# Patient Record
Sex: Female | Born: 1960 | Race: White | Hispanic: No | Smoking: Never smoker
Health system: Southern US, Community
[De-identification: ages and names within clinical notes are randomized; demographics above are authoritative.]

## PROBLEM LIST (undated history)

## (undated) DIAGNOSIS — C801 Malignant (primary) neoplasm, unspecified: Secondary | ICD-10-CM

## (undated) DIAGNOSIS — E785 Hyperlipidemia, unspecified: Secondary | ICD-10-CM

## (undated) DIAGNOSIS — Z9221 Personal history of antineoplastic chemotherapy: Secondary | ICD-10-CM

## (undated) DIAGNOSIS — K219 Gastro-esophageal reflux disease without esophagitis: Secondary | ICD-10-CM

## (undated) DIAGNOSIS — G629 Polyneuropathy, unspecified: Secondary | ICD-10-CM

## (undated) DIAGNOSIS — M1990 Unspecified osteoarthritis, unspecified site: Secondary | ICD-10-CM

## (undated) DIAGNOSIS — Z923 Personal history of irradiation: Secondary | ICD-10-CM

## (undated) DIAGNOSIS — M6788 Other specified disorders of synovium and tendon, other site: Secondary | ICD-10-CM

## (undated) DIAGNOSIS — E119 Type 2 diabetes mellitus without complications: Secondary | ICD-10-CM

## (undated) DIAGNOSIS — M2550 Pain in unspecified joint: Secondary | ICD-10-CM

## (undated) DIAGNOSIS — Z1371 Encounter for nonprocreative screening for genetic disease carrier status: Secondary | ICD-10-CM

## (undated) DIAGNOSIS — M7989 Other specified soft tissue disorders: Secondary | ICD-10-CM

## (undated) DIAGNOSIS — K760 Fatty (change of) liver, not elsewhere classified: Secondary | ICD-10-CM

## (undated) HISTORY — DX: Type 2 diabetes mellitus without complications: E11.9

## (undated) HISTORY — DX: Fatty (change of) liver, not elsewhere classified: K76.0

## (undated) HISTORY — DX: Gastro-esophageal reflux disease without esophagitis: K21.9

## (undated) HISTORY — DX: Pain in unspecified joint: M25.50

## (undated) HISTORY — PX: REDUCTION MAMMAPLASTY: SUR839

## (undated) HISTORY — DX: Unspecified osteoarthritis, unspecified site: M19.90

## (undated) HISTORY — DX: Polyneuropathy, unspecified: G62.9

## (undated) HISTORY — PX: OOPHORECTOMY: SHX86

## (undated) HISTORY — DX: Other specified soft tissue disorders: M79.89

## (undated) HISTORY — PX: CYSTOSCOPY WITH BIOPSY: SHX5122

## (undated) HISTORY — DX: Hyperlipidemia, unspecified: E78.5

## (undated) HISTORY — DX: Other specified disorders of synovium and tendon, other site: M67.88

---

## 1998-09-30 ENCOUNTER — Ambulatory Visit (HOSPITAL_COMMUNITY): Admission: RE | Admit: 1998-09-30 | Discharge: 1998-09-30 | Payer: Self-pay | Admitting: Obstetrics and Gynecology

## 1998-10-14 ENCOUNTER — Encounter: Payer: Self-pay | Admitting: Obstetrics and Gynecology

## 1998-10-14 ENCOUNTER — Ambulatory Visit (HOSPITAL_COMMUNITY): Admission: RE | Admit: 1998-10-14 | Discharge: 1998-10-14 | Payer: Self-pay | Admitting: Obstetrics and Gynecology

## 1998-11-24 ENCOUNTER — Ambulatory Visit (HOSPITAL_COMMUNITY): Admission: RE | Admit: 1998-11-24 | Discharge: 1998-11-24 | Payer: Self-pay | Admitting: *Deleted

## 1998-11-24 ENCOUNTER — Encounter: Payer: Self-pay | Admitting: Surgery

## 1998-11-24 ENCOUNTER — Encounter (INDEPENDENT_AMBULATORY_CARE_PROVIDER_SITE_OTHER): Payer: Self-pay | Admitting: Specialist

## 1999-04-03 HISTORY — PX: ABDOMINAL HYSTERECTOMY: SHX81

## 1999-07-31 ENCOUNTER — Encounter (INDEPENDENT_AMBULATORY_CARE_PROVIDER_SITE_OTHER): Payer: Self-pay

## 1999-07-31 ENCOUNTER — Inpatient Hospital Stay (HOSPITAL_COMMUNITY): Admission: RE | Admit: 1999-07-31 | Discharge: 1999-08-01 | Payer: Self-pay | Admitting: Obstetrics and Gynecology

## 2000-07-22 ENCOUNTER — Encounter: Admission: RE | Admit: 2000-07-22 | Discharge: 2000-10-20 | Payer: Self-pay | Admitting: Anesthesiology

## 2001-09-29 ENCOUNTER — Encounter: Payer: Self-pay | Admitting: Surgery

## 2001-09-29 ENCOUNTER — Ambulatory Visit (HOSPITAL_COMMUNITY): Admission: RE | Admit: 2001-09-29 | Discharge: 2001-09-29 | Payer: Self-pay | Admitting: Surgery

## 2002-04-02 HISTORY — PX: BREAST REDUCTION SURGERY: SHX8

## 2002-06-30 ENCOUNTER — Encounter (INDEPENDENT_AMBULATORY_CARE_PROVIDER_SITE_OTHER): Payer: Self-pay | Admitting: *Deleted

## 2002-06-30 ENCOUNTER — Ambulatory Visit (HOSPITAL_BASED_OUTPATIENT_CLINIC_OR_DEPARTMENT_OTHER): Admission: RE | Admit: 2002-06-30 | Discharge: 2002-07-01 | Payer: Self-pay | Admitting: Orthopaedic Surgery

## 2003-03-03 ENCOUNTER — Ambulatory Visit (HOSPITAL_COMMUNITY): Admission: RE | Admit: 2003-03-03 | Discharge: 2003-03-03 | Payer: Self-pay | Admitting: Obstetrics and Gynecology

## 2004-04-24 ENCOUNTER — Encounter: Admission: RE | Admit: 2004-04-24 | Discharge: 2004-04-24 | Payer: Self-pay | Admitting: Obstetrics and Gynecology

## 2005-04-02 DIAGNOSIS — C801 Malignant (primary) neoplasm, unspecified: Secondary | ICD-10-CM

## 2005-04-02 HISTORY — DX: Malignant (primary) neoplasm, unspecified: C80.1

## 2005-04-02 HISTORY — PX: PORT-A-CATH REMOVAL: SHX5289

## 2005-04-02 HISTORY — PX: CARPAL TUNNEL RELEASE: SHX101

## 2005-04-02 HISTORY — PX: BREAST SURGERY: SHX581

## 2005-04-02 HISTORY — PX: BREAST LUMPECTOMY: SHX2

## 2005-06-26 ENCOUNTER — Encounter: Admission: RE | Admit: 2005-06-26 | Discharge: 2005-06-26 | Payer: Self-pay | Admitting: Surgery

## 2005-06-29 ENCOUNTER — Encounter (INDEPENDENT_AMBULATORY_CARE_PROVIDER_SITE_OTHER): Payer: Self-pay | Admitting: Specialist

## 2005-06-29 ENCOUNTER — Encounter: Admission: RE | Admit: 2005-06-29 | Discharge: 2005-06-29 | Payer: Self-pay | Admitting: Surgery

## 2005-06-29 ENCOUNTER — Encounter (INDEPENDENT_AMBULATORY_CARE_PROVIDER_SITE_OTHER): Payer: Self-pay | Admitting: Diagnostic Radiology

## 2005-07-03 ENCOUNTER — Ambulatory Visit (HOSPITAL_COMMUNITY): Admission: RE | Admit: 2005-07-03 | Discharge: 2005-07-03 | Payer: Self-pay | Admitting: General Surgery

## 2005-07-04 ENCOUNTER — Encounter: Admission: RE | Admit: 2005-07-04 | Discharge: 2005-07-04 | Payer: Self-pay | Admitting: General Surgery

## 2005-07-04 ENCOUNTER — Encounter (INDEPENDENT_AMBULATORY_CARE_PROVIDER_SITE_OTHER): Payer: Self-pay | Admitting: *Deleted

## 2005-07-04 ENCOUNTER — Ambulatory Visit (HOSPITAL_BASED_OUTPATIENT_CLINIC_OR_DEPARTMENT_OTHER): Admission: RE | Admit: 2005-07-04 | Discharge: 2005-07-04 | Payer: Self-pay | Admitting: General Surgery

## 2005-07-09 ENCOUNTER — Ambulatory Visit: Payer: Self-pay | Admitting: Oncology

## 2005-07-11 LAB — CBC WITH DIFFERENTIAL/PLATELET
Eosinophils Absolute: 0.1 10*3/uL (ref 0.0–0.5)
MONO#: 0.3 10*3/uL (ref 0.1–0.9)
NEUT#: 3.4 10*3/uL (ref 1.5–6.5)
Platelets: 266 10*3/uL (ref 145–400)
RBC: 4.28 10*6/uL (ref 3.70–5.32)
RDW: 12.9 % (ref 11.3–14.5)
WBC: 5.3 10*3/uL (ref 3.9–10.0)

## 2005-07-11 LAB — COMPREHENSIVE METABOLIC PANEL
Albumin: 4.7 g/dL (ref 3.5–5.2)
CO2: 23 mEq/L (ref 19–32)
Chloride: 103 mEq/L (ref 96–112)
Glucose, Bld: 119 mg/dL — ABNORMAL HIGH (ref 70–99)
Potassium: 4.1 mEq/L (ref 3.5–5.3)
Sodium: 137 mEq/L (ref 135–145)
Total Protein: 7.5 g/dL (ref 6.0–8.3)

## 2005-07-11 LAB — LACTATE DEHYDROGENASE: LDH: 137 U/L (ref 94–250)

## 2005-07-11 LAB — CANCER ANTIGEN 27.29: CA 27.29: 9 U/mL (ref 0–39)

## 2005-07-19 ENCOUNTER — Ambulatory Visit: Payer: Self-pay | Admitting: Cardiology

## 2005-07-19 ENCOUNTER — Ambulatory Visit (HOSPITAL_COMMUNITY): Admission: RE | Admit: 2005-07-19 | Discharge: 2005-07-19 | Payer: Self-pay | Admitting: Oncology

## 2005-07-19 ENCOUNTER — Encounter: Payer: Self-pay | Admitting: Cardiology

## 2005-07-23 ENCOUNTER — Ambulatory Visit (HOSPITAL_COMMUNITY): Admission: RE | Admit: 2005-07-23 | Discharge: 2005-07-23 | Payer: Self-pay | Admitting: Oncology

## 2005-07-26 ENCOUNTER — Ambulatory Visit (HOSPITAL_BASED_OUTPATIENT_CLINIC_OR_DEPARTMENT_OTHER): Admission: RE | Admit: 2005-07-26 | Discharge: 2005-07-26 | Payer: Self-pay | Admitting: General Surgery

## 2005-08-02 LAB — CBC WITH DIFFERENTIAL/PLATELET
EOS%: 2.4 % (ref 0.0–7.0)
MCH: 30.3 pg (ref 26.0–34.0)
MCV: 87.6 fL (ref 81.0–101.0)
MONO%: 5.6 % (ref 0.0–13.0)
NEUT#: 3.3 10*3/uL (ref 1.5–6.5)
RBC: 4.28 10*6/uL (ref 3.70–5.32)
RDW: 13.1 % (ref 11.3–14.5)
lymph#: 1.5 10*3/uL (ref 0.9–3.3)

## 2005-08-13 LAB — CBC WITH DIFFERENTIAL/PLATELET
Eosinophils Absolute: 0 10*3/uL (ref 0.0–0.5)
HGB: 10.8 g/dL — ABNORMAL LOW (ref 11.6–15.9)
MONO#: 0.1 10*3/uL (ref 0.1–0.9)
NEUT#: 4.3 10*3/uL (ref 1.5–6.5)
RBC: 3.64 10*6/uL — ABNORMAL LOW (ref 3.70–5.32)
RDW: 12.6 % (ref 11.3–14.5)
WBC: 6.3 10*3/uL (ref 3.9–10.0)
lymph#: 1.9 10*3/uL (ref 0.9–3.3)

## 2005-08-23 LAB — COMPREHENSIVE METABOLIC PANEL
Albumin: 4.3 g/dL (ref 3.5–5.2)
CO2: 25 mEq/L (ref 19–32)
Glucose, Bld: 81 mg/dL (ref 70–99)
Sodium: 136 mEq/L (ref 135–145)
Total Bilirubin: 0.2 mg/dL — ABNORMAL LOW (ref 0.3–1.2)
Total Protein: 7 g/dL (ref 6.0–8.3)

## 2005-08-23 LAB — CBC WITH DIFFERENTIAL/PLATELET
Basophils Absolute: 0 10*3/uL (ref 0.0–0.1)
Eosinophils Absolute: 0 10*3/uL (ref 0.0–0.5)
HCT: 31 % — ABNORMAL LOW (ref 34.8–46.6)
HGB: 10.8 g/dL — ABNORMAL LOW (ref 11.6–15.9)
LYMPH%: 30.7 % (ref 14.0–48.0)
MONO#: 0.4 10*3/uL (ref 0.1–0.9)
NEUT#: 2.9 10*3/uL (ref 1.5–6.5)
NEUT%: 59.9 % (ref 39.6–76.8)
Platelets: 359 10*3/uL (ref 145–400)
RBC: 3.59 10*6/uL — ABNORMAL LOW (ref 3.70–5.32)
WBC: 4.8 10*3/uL (ref 3.9–10.0)

## 2005-08-24 ENCOUNTER — Ambulatory Visit: Payer: Self-pay | Admitting: Oncology

## 2005-09-06 LAB — CBC WITH DIFFERENTIAL/PLATELET
Basophils Absolute: 0 10*3/uL (ref 0.0–0.1)
EOS%: 0.1 % (ref 0.0–7.0)
Eosinophils Absolute: 0 10*3/uL (ref 0.0–0.5)
HGB: 11.9 g/dL (ref 11.6–15.9)
MCH: 30.3 pg (ref 26.0–34.0)
MONO%: 6.1 % (ref 0.0–13.0)
NEUT#: 5.1 10*3/uL (ref 1.5–6.5)
RBC: 3.95 10*6/uL (ref 3.70–5.32)
RDW: 14 % (ref 11.3–14.5)
lymph#: 1.7 10*3/uL (ref 0.9–3.3)

## 2005-09-13 LAB — CBC WITH DIFFERENTIAL/PLATELET
Basophils Absolute: 0 10*3/uL (ref 0.0–0.1)
Eosinophils Absolute: 0 10*3/uL (ref 0.0–0.5)
HGB: 11 g/dL — ABNORMAL LOW (ref 11.6–15.9)
MCV: 88.2 fL (ref 81.0–101.0)
MONO#: 0.4 10*3/uL (ref 0.1–0.9)
MONO%: 8.8 % (ref 0.0–13.0)
NEUT#: 2.7 10*3/uL (ref 1.5–6.5)
RBC: 3.55 10*6/uL — ABNORMAL LOW (ref 3.70–5.32)
RDW: 15.9 % — ABNORMAL HIGH (ref 11.3–14.5)
WBC: 4.2 10*3/uL (ref 3.9–10.0)

## 2005-09-14 LAB — COMPREHENSIVE METABOLIC PANEL
Albumin: 4.5 g/dL (ref 3.5–5.2)
Alkaline Phosphatase: 63 U/L (ref 39–117)
BUN: 11 mg/dL (ref 6–23)
CO2: 27 mEq/L (ref 19–32)
Calcium: 9.2 mg/dL (ref 8.4–10.5)
Chloride: 104 mEq/L (ref 96–112)
Glucose, Bld: 102 mg/dL — ABNORMAL HIGH (ref 70–99)
Potassium: 4 mEq/L (ref 3.5–5.3)
Sodium: 140 mEq/L (ref 135–145)
Total Protein: 7.2 g/dL (ref 6.0–8.3)

## 2005-09-20 LAB — CBC WITH DIFFERENTIAL/PLATELET
Basophils Absolute: 0 10*3/uL (ref 0.0–0.1)
Eosinophils Absolute: 0 10*3/uL (ref 0.0–0.5)
HGB: 9.9 g/dL — ABNORMAL LOW (ref 11.6–15.9)
MCV: 88 fL (ref 81.0–101.0)
MONO%: 1.1 % (ref 0.0–13.0)
NEUT#: 1.9 10*3/uL (ref 1.5–6.5)
RBC: 3.23 10*6/uL — ABNORMAL LOW (ref 3.70–5.32)
RDW: 15.4 % — ABNORMAL HIGH (ref 11.3–14.5)
WBC: 2.6 10*3/uL — ABNORMAL LOW (ref 3.9–10.0)
lymph#: 0.6 10*3/uL — ABNORMAL LOW (ref 0.9–3.3)

## 2005-09-20 LAB — COMPREHENSIVE METABOLIC PANEL
AST: 12 U/L (ref 0–37)
Albumin: 4 g/dL (ref 3.5–5.2)
Alkaline Phosphatase: 64 U/L (ref 39–117)
BUN: 14 mg/dL (ref 6–23)
Calcium: 8.2 mg/dL — ABNORMAL LOW (ref 8.4–10.5)
Chloride: 106 mEq/L (ref 96–112)
Glucose, Bld: 100 mg/dL — ABNORMAL HIGH (ref 70–99)
Potassium: 4.3 mEq/L (ref 3.5–5.3)
Sodium: 139 mEq/L (ref 135–145)
Total Protein: 6.2 g/dL (ref 6.0–8.3)

## 2005-10-04 LAB — CBC WITH DIFFERENTIAL/PLATELET
Basophils Absolute: 0 10*3/uL (ref 0.0–0.1)
EOS%: 0 % (ref 0.0–7.0)
Eosinophils Absolute: 0 10*3/uL (ref 0.0–0.5)
HGB: 10.7 g/dL — ABNORMAL LOW (ref 11.6–15.9)
NEUT#: 2.4 10*3/uL (ref 1.5–6.5)
RDW: 17.9 % — ABNORMAL HIGH (ref 11.3–14.5)
WBC: 4 10*3/uL (ref 3.9–10.0)
lymph#: 1.1 10*3/uL (ref 0.9–3.3)

## 2005-10-04 LAB — COMPREHENSIVE METABOLIC PANEL
AST: 21 U/L (ref 0–37)
Albumin: 4.5 g/dL (ref 3.5–5.2)
BUN: 16 mg/dL (ref 6–23)
Calcium: 9.3 mg/dL (ref 8.4–10.5)
Chloride: 101 mEq/L (ref 96–112)
Glucose, Bld: 96 mg/dL (ref 70–99)
Potassium: 4.2 mEq/L (ref 3.5–5.3)

## 2005-10-05 ENCOUNTER — Ambulatory Visit: Payer: Self-pay | Admitting: Oncology

## 2005-10-12 LAB — CBC WITH DIFFERENTIAL/PLATELET
BASO%: 0.2 % (ref 0.0–2.0)
Basophils Absolute: 0.1 10*3/uL (ref 0.0–0.1)
EOS%: 0.1 % (ref 0.0–7.0)
HGB: 11.1 g/dL — ABNORMAL LOW (ref 11.6–15.9)
MCH: 31.2 pg (ref 26.0–34.0)
RBC: 3.55 10*6/uL — ABNORMAL LOW (ref 3.70–5.32)
RDW: 18.3 % — ABNORMAL HIGH (ref 11.3–14.5)
lymph#: 2.2 10*3/uL (ref 0.9–3.3)

## 2005-10-24 LAB — CBC WITH DIFFERENTIAL/PLATELET
Basophils Absolute: 0.1 10*3/uL (ref 0.0–0.1)
Eosinophils Absolute: 0 10*3/uL (ref 0.0–0.5)
HGB: 10.9 g/dL — ABNORMAL LOW (ref 11.6–15.9)
MONO#: 0.5 10*3/uL (ref 0.1–0.9)
NEUT#: 2.8 10*3/uL (ref 1.5–6.5)
RDW: 18.5 % — ABNORMAL HIGH (ref 11.3–14.5)
WBC: 4.4 10*3/uL (ref 3.9–10.0)
lymph#: 1 10*3/uL (ref 0.9–3.3)

## 2005-10-24 LAB — COMPREHENSIVE METABOLIC PANEL
Albumin: 4.6 g/dL (ref 3.5–5.2)
BUN: 14 mg/dL (ref 6–23)
Calcium: 9.7 mg/dL (ref 8.4–10.5)
Chloride: 102 mEq/L (ref 96–112)
Glucose, Bld: 81 mg/dL (ref 70–99)
Potassium: 4.3 mEq/L (ref 3.5–5.3)
Sodium: 138 mEq/L (ref 135–145)
Total Protein: 7.2 g/dL (ref 6.0–8.3)

## 2005-11-15 LAB — COMPREHENSIVE METABOLIC PANEL
Albumin: 4.4 g/dL (ref 3.5–5.2)
Alkaline Phosphatase: 73 U/L (ref 39–117)
BUN: 15 mg/dL (ref 6–23)
CO2: 23 mEq/L (ref 19–32)
Calcium: 9.6 mg/dL (ref 8.4–10.5)
Glucose, Bld: 90 mg/dL (ref 70–99)
Potassium: 4.2 mEq/L (ref 3.5–5.3)
Total Protein: 6.8 g/dL (ref 6.0–8.3)

## 2005-11-15 LAB — CBC WITH DIFFERENTIAL/PLATELET
BASO%: 0.9 % (ref 0.0–2.0)
LYMPH%: 18.5 % (ref 14.0–48.0)
MCHC: 34.3 g/dL (ref 32.0–36.0)
MONO#: 0.5 10*3/uL (ref 0.1–0.9)
Platelets: 324 10*3/uL (ref 145–400)
RBC: 3.51 10*6/uL — ABNORMAL LOW (ref 3.70–5.32)
RDW: 16.7 % — ABNORMAL HIGH (ref 11.3–14.5)
WBC: 5.3 10*3/uL (ref 3.9–10.0)
lymph#: 1 10*3/uL (ref 0.9–3.3)

## 2005-11-16 ENCOUNTER — Ambulatory Visit: Payer: Self-pay | Admitting: Oncology

## 2005-11-21 ENCOUNTER — Ambulatory Visit: Admission: RE | Admit: 2005-11-21 | Discharge: 2006-02-02 | Payer: Self-pay | Admitting: Radiation Oncology

## 2005-11-23 LAB — CBC WITH DIFFERENTIAL/PLATELET
Basophils Absolute: 0 10*3/uL (ref 0.0–0.1)
Eosinophils Absolute: 0 10*3/uL (ref 0.0–0.5)
HGB: 9.7 g/dL — ABNORMAL LOW (ref 11.6–15.9)
MCV: 87.4 fL (ref 81.0–101.0)
MONO#: 0.1 10*3/uL (ref 0.1–0.9)
NEUT#: 0.2 10*3/uL — CL (ref 1.5–6.5)
RBC: 3.18 10*6/uL — ABNORMAL LOW (ref 3.70–5.32)
RDW: 16.3 % — ABNORMAL HIGH (ref 11.3–14.5)
WBC: 0.8 10*3/uL — CL (ref 3.9–10.0)
lymph#: 0.5 10*3/uL — ABNORMAL LOW (ref 0.9–3.3)

## 2005-11-23 LAB — COMPREHENSIVE METABOLIC PANEL
ALT: 27 U/L (ref 0–40)
AST: 19 U/L (ref 0–37)
Alkaline Phosphatase: 59 U/L (ref 39–117)
CO2: 25 mEq/L (ref 19–32)
Chloride: 102 mEq/L (ref 96–112)
Creatinine, Ser: 0.98 mg/dL (ref 0.40–1.20)
Potassium: 4 mEq/L (ref 3.5–5.3)
Sodium: 139 mEq/L (ref 135–145)
Total Bilirubin: 0.4 mg/dL (ref 0.3–1.2)

## 2005-11-27 LAB — CBC WITH DIFFERENTIAL/PLATELET
BASO%: 1 % (ref 0.0–2.0)
HCT: 28.4 % — ABNORMAL LOW (ref 34.8–46.6)
LYMPH%: 34.9 % (ref 14.0–48.0)
MCHC: 34.2 g/dL (ref 32.0–36.0)
MONO#: 0.4 10*3/uL (ref 0.1–0.9)
NEUT%: 34 % — ABNORMAL LOW (ref 39.6–76.8)
Platelets: 249 10*3/uL (ref 145–400)
WBC: 1.4 10*3/uL — ABNORMAL LOW (ref 3.9–10.0)

## 2005-11-29 LAB — CBC WITH DIFFERENTIAL/PLATELET
BASO%: 1.3 % (ref 0.0–2.0)
EOS%: 1.7 % (ref 0.0–7.0)
HCT: 31.9 % — ABNORMAL LOW (ref 34.8–46.6)
LYMPH%: 24.7 % (ref 14.0–48.0)
MCH: 29.6 pg (ref 26.0–34.0)
MCHC: 33.6 g/dL (ref 32.0–36.0)
MONO%: 21.6 % — ABNORMAL HIGH (ref 0.0–13.0)
NEUT%: 50.6 % (ref 39.6–76.8)
Platelets: 229 10*3/uL (ref 145–400)
lymph#: 0.6 10*3/uL — ABNORMAL LOW (ref 0.9–3.3)

## 2005-12-04 ENCOUNTER — Ambulatory Visit (HOSPITAL_COMMUNITY): Admission: RE | Admit: 2005-12-04 | Discharge: 2005-12-04 | Payer: Self-pay | Admitting: Oncology

## 2005-12-04 LAB — CBC WITH DIFFERENTIAL/PLATELET
BASO%: 0.2 % (ref 0.0–2.0)
EOS%: 0.3 % (ref 0.0–7.0)
MCH: 29.8 pg (ref 26.0–34.0)
MCHC: 34.2 g/dL (ref 32.0–36.0)
RBC: 3.54 10*6/uL — ABNORMAL LOW (ref 3.70–5.32)
RDW: 16 % — ABNORMAL HIGH (ref 11.3–14.5)
lymph#: 1 10*3/uL (ref 0.9–3.3)

## 2005-12-11 LAB — COMPREHENSIVE METABOLIC PANEL
Albumin: 4.2 g/dL (ref 3.5–5.2)
BUN: 13 mg/dL (ref 6–23)
Calcium: 9.3 mg/dL (ref 8.4–10.5)
Chloride: 101 mEq/L (ref 96–112)
Glucose, Bld: 78 mg/dL (ref 70–99)
Potassium: 4.3 mEq/L (ref 3.5–5.3)

## 2005-12-11 LAB — CBC WITH DIFFERENTIAL/PLATELET
Basophils Absolute: 0 10*3/uL (ref 0.0–0.1)
Eosinophils Absolute: 0.1 10*3/uL (ref 0.0–0.5)
HCT: 30.5 % — ABNORMAL LOW (ref 34.8–46.6)
HGB: 10.4 g/dL — ABNORMAL LOW (ref 11.6–15.9)
MCV: 87.2 fL (ref 81.0–101.0)
NEUT#: 2.4 10*3/uL (ref 1.5–6.5)
RDW: 17.5 % — ABNORMAL HIGH (ref 11.3–14.5)
lymph#: 1.1 10*3/uL (ref 0.9–3.3)

## 2005-12-13 ENCOUNTER — Encounter (INDEPENDENT_AMBULATORY_CARE_PROVIDER_SITE_OTHER): Payer: Self-pay | Admitting: Cardiology

## 2005-12-13 ENCOUNTER — Ambulatory Visit (HOSPITAL_COMMUNITY): Admission: RE | Admit: 2005-12-13 | Discharge: 2005-12-13 | Payer: Self-pay | Admitting: Oncology

## 2006-02-06 ENCOUNTER — Ambulatory Visit: Payer: Self-pay | Admitting: Oncology

## 2006-02-07 LAB — CBC WITH DIFFERENTIAL/PLATELET
EOS%: 1.6 % (ref 0.0–7.0)
LYMPH%: 32.8 % (ref 14.0–48.0)
MCH: 28.8 pg (ref 26.0–34.0)
MCHC: 34.5 g/dL (ref 32.0–36.0)
MCV: 83.5 fL (ref 81.0–101.0)
MONO%: 8.2 % (ref 0.0–13.0)
Platelets: 242 10*3/uL (ref 145–400)
RBC: 4.02 10*6/uL (ref 3.70–5.32)
RDW: 15.7 % — ABNORMAL HIGH (ref 11.3–14.5)

## 2006-02-12 LAB — TSH: TSH: 1.101 u[IU]/mL (ref 0.350–5.500)

## 2006-02-14 ENCOUNTER — Encounter: Admission: RE | Admit: 2006-02-14 | Discharge: 2006-02-14 | Payer: Self-pay | Admitting: Oncology

## 2006-02-23 LAB — COMPREHENSIVE METABOLIC PANEL
AST: 29 U/L (ref 0–37)
Albumin: 4.2 g/dL (ref 3.5–5.2)
Alkaline Phosphatase: 85 U/L (ref 39–117)
Glucose, Bld: 104 mg/dL — ABNORMAL HIGH (ref 70–99)
Potassium: 3.9 mEq/L (ref 3.5–5.3)
Sodium: 140 mEq/L (ref 135–145)
Total Bilirubin: 0.3 mg/dL (ref 0.3–1.2)
Total Protein: 6.7 g/dL (ref 6.0–8.3)

## 2006-02-23 LAB — ESTRADIOL, ULTRA SENS: Estradiol, Ultra Sensitive: <2 pg/mL

## 2006-02-23 LAB — FOLLICLE STIMULATING HORMONE: FSH: 59.7 m[IU]/mL

## 2006-02-23 LAB — LUTEINIZING HORMONE: LH: 32.9 m[IU]/mL

## 2006-03-21 ENCOUNTER — Ambulatory Visit (HOSPITAL_COMMUNITY): Admission: RE | Admit: 2006-03-21 | Discharge: 2006-03-21 | Payer: Self-pay | Admitting: Oncology

## 2006-03-24 ENCOUNTER — Ambulatory Visit: Payer: Self-pay | Admitting: Oncology

## 2006-03-28 ENCOUNTER — Ambulatory Visit (HOSPITAL_BASED_OUTPATIENT_CLINIC_OR_DEPARTMENT_OTHER): Admission: RE | Admit: 2006-03-28 | Discharge: 2006-03-28 | Payer: Self-pay | Admitting: Orthopedic Surgery

## 2006-03-29 LAB — CBC WITH DIFFERENTIAL/PLATELET
BASO%: 0.4 % (ref 0.0–2.0)
HCT: 34.5 % — ABNORMAL LOW (ref 34.8–46.6)
HGB: 11.8 g/dL (ref 11.6–15.9)
MCHC: 34.3 g/dL (ref 32.0–36.0)
MONO#: 0.2 10*3/uL (ref 0.1–0.9)
NEUT%: 64.1 % (ref 39.6–76.8)
RDW: 15.2 % — ABNORMAL HIGH (ref 11.3–14.5)
WBC: 4 10*3/uL (ref 3.9–10.0)
lymph#: 1.2 10*3/uL (ref 0.9–3.3)

## 2006-03-29 LAB — LACTATE DEHYDROGENASE: LDH: 152 U/L (ref 94–250)

## 2006-03-29 LAB — COMPREHENSIVE METABOLIC PANEL
ALT: 16 U/L (ref 0–35)
AST: 18 U/L (ref 0–37)
Albumin: 4.2 g/dL (ref 3.5–5.2)
CO2: 26 mEq/L (ref 19–32)
Calcium: 8.7 mg/dL (ref 8.4–10.5)
Chloride: 103 mEq/L (ref 96–112)
Creatinine, Ser: 1.18 mg/dL (ref 0.40–1.20)
Potassium: 4.5 mEq/L (ref 3.5–5.3)
Sodium: 140 mEq/L (ref 135–145)
Total Protein: 6.7 g/dL (ref 6.0–8.3)

## 2006-05-08 ENCOUNTER — Ambulatory Visit: Payer: Self-pay | Admitting: Oncology

## 2006-07-12 ENCOUNTER — Encounter: Admission: RE | Admit: 2006-07-12 | Discharge: 2006-07-12 | Payer: Self-pay | Admitting: Oncology

## 2006-08-01 ENCOUNTER — Ambulatory Visit: Payer: Self-pay | Admitting: Oncology

## 2006-08-05 LAB — CBC WITH DIFFERENTIAL/PLATELET
BASO%: 0.4 % (ref 0.0–2.0)
EOS%: 1.6 % (ref 0.0–7.0)
MCH: 30.9 pg (ref 26.0–34.0)
MCHC: 35.2 g/dL (ref 32.0–36.0)
MONO%: 5.6 % (ref 0.0–13.0)
NEUT%: 60.7 % (ref 39.6–76.8)
RDW: 13.3 % (ref 11.3–14.5)
lymph#: 1.5 10*3/uL (ref 0.9–3.3)

## 2006-08-05 LAB — COMPREHENSIVE METABOLIC PANEL
ALT: 16 U/L (ref 0–35)
AST: 19 U/L (ref 0–37)
Alkaline Phosphatase: 63 U/L (ref 39–117)
Calcium: 9 mg/dL (ref 8.4–10.5)
Chloride: 103 mEq/L (ref 96–112)
Creatinine, Ser: 0.99 mg/dL (ref 0.40–1.20)
Potassium: 4.2 mEq/L (ref 3.5–5.3)

## 2006-12-09 ENCOUNTER — Ambulatory Visit: Payer: Self-pay | Admitting: Oncology

## 2006-12-11 LAB — CBC WITH DIFFERENTIAL/PLATELET
Basophils Absolute: 0 10*3/uL (ref 0.0–0.1)
Eosinophils Absolute: 0.1 10*3/uL (ref 0.0–0.5)
HCT: 33 % — ABNORMAL LOW (ref 34.8–46.6)
HGB: 11.8 g/dL (ref 11.6–15.9)
LYMPH%: 41.1 % (ref 14.0–48.0)
MCV: 87.4 fL (ref 81.0–101.0)
MONO%: 7.9 % (ref 0.0–13.0)
NEUT#: 2.1 10*3/uL (ref 1.5–6.5)
NEUT%: 49.3 % (ref 39.6–76.8)
Platelets: 197 10*3/uL (ref 145–400)
RBC: 3.78 10*6/uL (ref 3.70–5.32)

## 2006-12-11 LAB — COMPREHENSIVE METABOLIC PANEL
ALT: 20 U/L (ref 0–35)
Alkaline Phosphatase: 46 U/L (ref 39–117)
CO2: 23 mEq/L (ref 19–32)
Sodium: 141 mEq/L (ref 135–145)
Total Bilirubin: 0.2 mg/dL — ABNORMAL LOW (ref 0.3–1.2)
Total Protein: 6.9 g/dL (ref 6.0–8.3)

## 2006-12-11 LAB — LACTATE DEHYDROGENASE: LDH: 159 U/L (ref 94–250)

## 2007-03-01 ENCOUNTER — Emergency Department (HOSPITAL_COMMUNITY): Admission: EM | Admit: 2007-03-01 | Discharge: 2007-03-01 | Payer: Self-pay | Admitting: Emergency Medicine

## 2007-04-03 HISTORY — PX: BLADDER SUSPENSION: SHX72

## 2007-04-07 ENCOUNTER — Ambulatory Visit: Payer: Self-pay | Admitting: Oncology

## 2007-04-09 LAB — CBC WITH DIFFERENTIAL/PLATELET
Basophils Absolute: 0 10*3/uL (ref 0.0–0.1)
EOS%: 1.3 % (ref 0.0–7.0)
HCT: 34.5 % — ABNORMAL LOW (ref 34.8–46.6)
HGB: 11.9 g/dL (ref 11.6–15.9)
LYMPH%: 44.1 % (ref 14.0–48.0)
MCH: 30.4 pg (ref 26.0–34.0)
MCV: 88.2 fL (ref 81.0–101.0)
MONO%: 6.9 % (ref 0.0–13.0)
NEUT%: 47.1 % (ref 39.6–76.8)

## 2007-04-10 LAB — COMPREHENSIVE METABOLIC PANEL
AST: 18 U/L (ref 0–37)
Alkaline Phosphatase: 44 U/L (ref 39–117)
BUN: 20 mg/dL (ref 6–23)
Creatinine, Ser: 1.17 mg/dL (ref 0.40–1.20)

## 2007-04-10 LAB — FSH/LH: LH: 11.2 m[IU]/mL

## 2007-04-15 LAB — VITAMIN D PNL(25-HYDRXY+1,25-DIHY)-BLD: Vit D, 25-Hydroxy: 24 ng/mL — ABNORMAL LOW (ref 30–89)

## 2007-04-17 LAB — ESTRADIOL, ULTRA SENS: Estradiol, Ultra Sensitive: 48 pg/mL

## 2007-05-16 LAB — FSH/LH: FSH: 16.5 m[IU]/mL

## 2007-05-21 ENCOUNTER — Ambulatory Visit: Payer: Self-pay | Admitting: Oncology

## 2007-05-23 ENCOUNTER — Ambulatory Visit (HOSPITAL_COMMUNITY): Admission: RE | Admit: 2007-05-23 | Discharge: 2007-05-23 | Payer: Self-pay | Admitting: Oncology

## 2007-05-28 ENCOUNTER — Encounter: Admission: RE | Admit: 2007-05-28 | Discharge: 2007-05-28 | Payer: Self-pay | Admitting: Oncology

## 2007-05-28 ENCOUNTER — Ambulatory Visit (HOSPITAL_COMMUNITY): Admission: RE | Admit: 2007-05-28 | Discharge: 2007-05-28 | Payer: Self-pay | Admitting: Oncology

## 2007-07-03 ENCOUNTER — Ambulatory Visit (HOSPITAL_COMMUNITY): Admission: RE | Admit: 2007-07-03 | Discharge: 2007-07-03 | Payer: Self-pay | Admitting: Urology

## 2007-07-03 ENCOUNTER — Encounter (INDEPENDENT_AMBULATORY_CARE_PROVIDER_SITE_OTHER): Payer: Self-pay | Admitting: Urology

## 2007-07-23 ENCOUNTER — Ambulatory Visit: Payer: Self-pay | Admitting: Oncology

## 2007-07-24 ENCOUNTER — Encounter: Admission: RE | Admit: 2007-07-24 | Discharge: 2007-07-24 | Payer: Self-pay | Admitting: Obstetrics and Gynecology

## 2007-07-28 LAB — COMPREHENSIVE METABOLIC PANEL
Albumin: 4.1 g/dL (ref 3.5–5.2)
CO2: 24 mEq/L (ref 19–32)
Calcium: 9.1 mg/dL (ref 8.4–10.5)
Glucose, Bld: 82 mg/dL (ref 70–99)
Potassium: 4.1 mEq/L (ref 3.5–5.3)
Sodium: 138 mEq/L (ref 135–145)
Total Protein: 6.6 g/dL (ref 6.0–8.3)

## 2007-07-28 LAB — CBC WITH DIFFERENTIAL/PLATELET
Eosinophils Absolute: 0.1 10*3/uL (ref 0.0–0.5)
HGB: 10.7 g/dL — ABNORMAL LOW (ref 11.6–15.9)
LYMPH%: 39.8 % (ref 14.0–48.0)
MONO#: 0.3 10*3/uL (ref 0.1–0.9)
NEUT#: 1.9 10*3/uL (ref 1.5–6.5)
Platelets: 189 10*3/uL (ref 145–400)
RBC: 3.49 10*6/uL — ABNORMAL LOW (ref 3.70–5.32)
RDW: 13.4 % (ref 11.3–14.5)
WBC: 3.8 10*3/uL — ABNORMAL LOW (ref 3.9–10.0)

## 2007-07-28 LAB — LACTATE DEHYDROGENASE: LDH: 139 U/L (ref 94–250)

## 2007-08-13 ENCOUNTER — Ambulatory Visit (HOSPITAL_COMMUNITY): Admission: RE | Admit: 2007-08-13 | Discharge: 2007-08-13 | Payer: Self-pay | Admitting: Oncology

## 2007-09-11 ENCOUNTER — Encounter: Admission: RE | Admit: 2007-09-11 | Discharge: 2007-10-09 | Payer: Self-pay | Admitting: Orthopedic Surgery

## 2007-11-11 ENCOUNTER — Ambulatory Visit: Payer: Self-pay | Admitting: Oncology

## 2008-05-19 ENCOUNTER — Ambulatory Visit: Payer: Self-pay | Admitting: Oncology

## 2008-05-21 LAB — CBC WITH DIFFERENTIAL/PLATELET
BASO%: 0.5 % (ref 0.0–2.0)
Basophils Absolute: 0 10*3/uL (ref 0.0–0.1)
EOS%: 2.3 % (ref 0.0–7.0)
HCT: 36.1 % (ref 34.8–46.6)
HGB: 12.5 g/dL (ref 11.6–15.9)
MCH: 31.5 pg (ref 25.1–34.0)
MCHC: 34.6 g/dL (ref 31.5–36.0)
MCV: 91 fL (ref 79.5–101.0)
MONO%: 5.1 % (ref 0.0–14.0)
NEUT%: 64.6 % (ref 38.4–76.8)

## 2008-05-24 LAB — COMPREHENSIVE METABOLIC PANEL
AST: 18 U/L (ref 0–37)
Alkaline Phosphatase: 81 U/L (ref 39–117)
BUN: 16 mg/dL (ref 6–23)
Creatinine, Ser: 1.13 mg/dL (ref 0.40–1.20)
Glucose, Bld: 90 mg/dL (ref 70–99)
Total Bilirubin: 0.4 mg/dL (ref 0.3–1.2)

## 2008-05-24 LAB — LUTEINIZING HORMONE: LH: 49.6 m[IU]/mL

## 2008-05-24 LAB — FOLLICLE STIMULATING HORMONE: FSH: 79.3 m[IU]/mL

## 2008-05-24 LAB — VITAMIN D 25 HYDROXY (VIT D DEFICIENCY, FRACTURES): Vit D, 25-Hydroxy: 27 ng/mL — ABNORMAL LOW (ref 30–89)

## 2008-05-31 LAB — ESTRADIOL, ULTRA SENS

## 2008-07-06 ENCOUNTER — Ambulatory Visit (HOSPITAL_BASED_OUTPATIENT_CLINIC_OR_DEPARTMENT_OTHER): Admission: RE | Admit: 2008-07-06 | Discharge: 2008-07-06 | Payer: Self-pay | Admitting: Urology

## 2008-07-06 ENCOUNTER — Encounter (INDEPENDENT_AMBULATORY_CARE_PROVIDER_SITE_OTHER): Payer: Self-pay | Admitting: Urology

## 2008-07-28 ENCOUNTER — Encounter: Admission: RE | Admit: 2008-07-28 | Discharge: 2008-07-28 | Payer: Self-pay | Admitting: Oncology

## 2008-11-19 ENCOUNTER — Ambulatory Visit: Payer: Self-pay | Admitting: Oncology

## 2008-11-22 LAB — CBC WITH DIFFERENTIAL/PLATELET
Basophils Absolute: 0 10*3/uL (ref 0.0–0.1)
Eosinophils Absolute: 0.1 10*3/uL (ref 0.0–0.5)
HGB: 11.8 g/dL (ref 11.6–15.9)
MCV: 88.8 fL (ref 79.5–101.0)
MONO#: 0.3 10*3/uL (ref 0.1–0.9)
MONO%: 7.2 % (ref 0.0–14.0)
NEUT#: 2.5 10*3/uL (ref 1.5–6.5)
RBC: 3.85 10*6/uL (ref 3.70–5.45)
RDW: 14.5 % (ref 11.2–14.5)
WBC: 4.6 10*3/uL (ref 3.9–10.3)
lymph#: 1.6 10*3/uL (ref 0.9–3.3)

## 2008-11-23 LAB — COMPREHENSIVE METABOLIC PANEL
AST: 22 U/L (ref 0–37)
Albumin: 4.5 g/dL (ref 3.5–5.2)
Alkaline Phosphatase: 88 U/L (ref 39–117)
BUN: 14 mg/dL (ref 6–23)
Calcium: 9.8 mg/dL (ref 8.4–10.5)
Chloride: 103 mEq/L (ref 96–112)
Glucose, Bld: 86 mg/dL (ref 70–99)
Potassium: 3.9 mEq/L (ref 3.5–5.3)
Sodium: 138 mEq/L (ref 135–145)
Total Protein: 6.9 g/dL (ref 6.0–8.3)

## 2008-11-23 LAB — CANCER ANTIGEN 27.29: CA 27.29: 16 U/mL (ref 0–39)

## 2009-01-11 ENCOUNTER — Ambulatory Visit: Payer: Self-pay | Admitting: Oncology

## 2009-06-10 ENCOUNTER — Ambulatory Visit: Payer: Self-pay | Admitting: Sports Medicine

## 2009-06-10 DIAGNOSIS — M2140 Flat foot [pes planus] (acquired), unspecified foot: Secondary | ICD-10-CM | POA: Insufficient documentation

## 2009-06-10 DIAGNOSIS — M79609 Pain in unspecified limb: Secondary | ICD-10-CM

## 2009-06-15 ENCOUNTER — Emergency Department (HOSPITAL_COMMUNITY): Admission: EM | Admit: 2009-06-15 | Discharge: 2009-06-15 | Payer: Self-pay | Admitting: Family Medicine

## 2009-06-15 ENCOUNTER — Emergency Department (HOSPITAL_COMMUNITY): Admission: EM | Admit: 2009-06-15 | Discharge: 2009-06-15 | Payer: Self-pay | Admitting: Emergency Medicine

## 2009-07-05 ENCOUNTER — Ambulatory Visit: Payer: Self-pay | Admitting: Oncology

## 2009-07-07 LAB — CBC WITH DIFFERENTIAL/PLATELET
Basophils Absolute: 0 10*3/uL (ref 0.0–0.1)
Eosinophils Absolute: 0.1 10*3/uL (ref 0.0–0.5)
HGB: 11.4 g/dL — ABNORMAL LOW (ref 11.6–15.9)
LYMPH%: 19.6 % (ref 14.0–49.7)
MCV: 90.4 fL (ref 79.5–101.0)
MONO%: 4.6 % (ref 0.0–14.0)
NEUT#: 6.4 10*3/uL (ref 1.5–6.5)
Platelets: 218 10*3/uL (ref 145–400)
RDW: 14 % (ref 11.2–14.5)

## 2009-07-07 LAB — COMPREHENSIVE METABOLIC PANEL
Albumin: 4.2 g/dL (ref 3.5–5.2)
Alkaline Phosphatase: 77 U/L (ref 39–117)
BUN: 13 mg/dL (ref 6–23)
CO2: 26 mEq/L (ref 19–32)
Glucose, Bld: 94 mg/dL (ref 70–99)
Potassium: 4 mEq/L (ref 3.5–5.3)

## 2009-07-07 LAB — CANCER ANTIGEN 27.29: CA 27.29: 12 U/mL (ref 0–39)

## 2009-07-07 LAB — LACTATE DEHYDROGENASE: LDH: 173 U/L (ref 94–250)

## 2009-07-07 LAB — VITAMIN D 25 HYDROXY (VIT D DEFICIENCY, FRACTURES): Vit D, 25-Hydroxy: 29 ng/mL — ABNORMAL LOW (ref 30–89)

## 2009-08-03 ENCOUNTER — Encounter: Admission: RE | Admit: 2009-08-03 | Discharge: 2009-08-03 | Payer: Self-pay | Admitting: Oncology

## 2010-01-23 ENCOUNTER — Ambulatory Visit: Payer: Self-pay | Admitting: Oncology

## 2010-02-11 IMAGING — CR DG LUMBAR SPINE COMPLETE 4+V
5 series · 5 of 5 positions shown · non-contrast
Comparison: none

CLINICAL DATA: 46-year-old female with history of breast cancer and back pain. 
 LUMBAR SPINE - 5 VIEW:

[t l-spine a.p.]
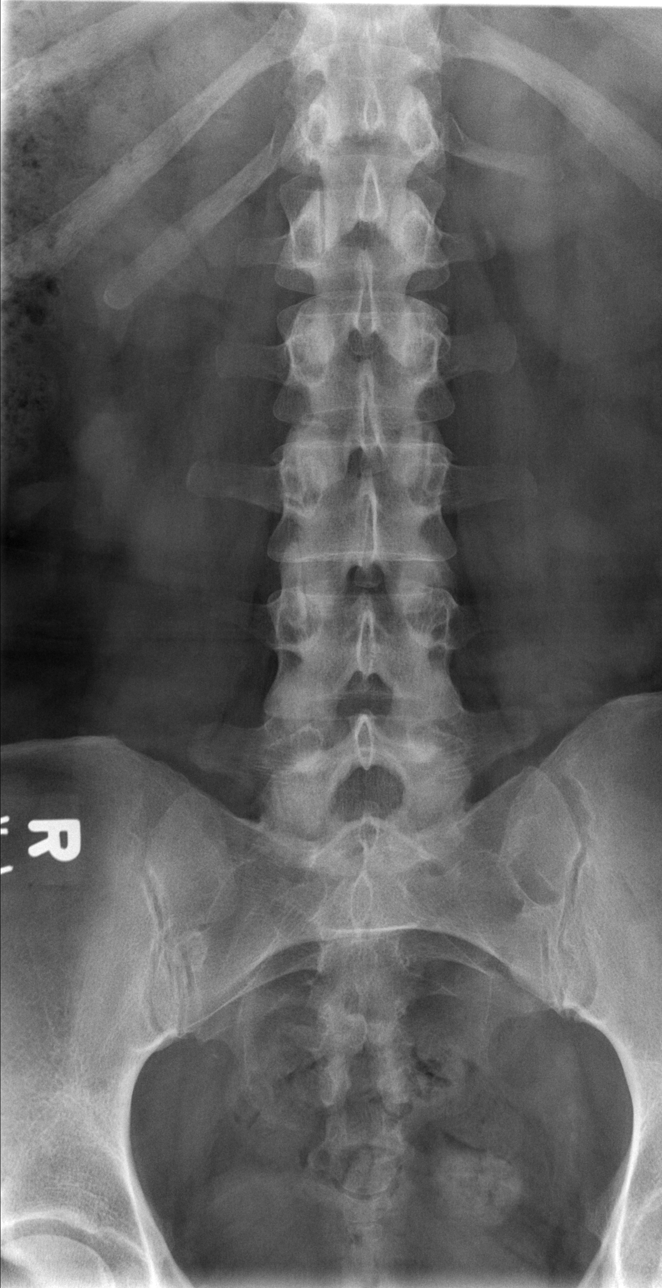

[t l-spine oblique exposure (1 of 2)]
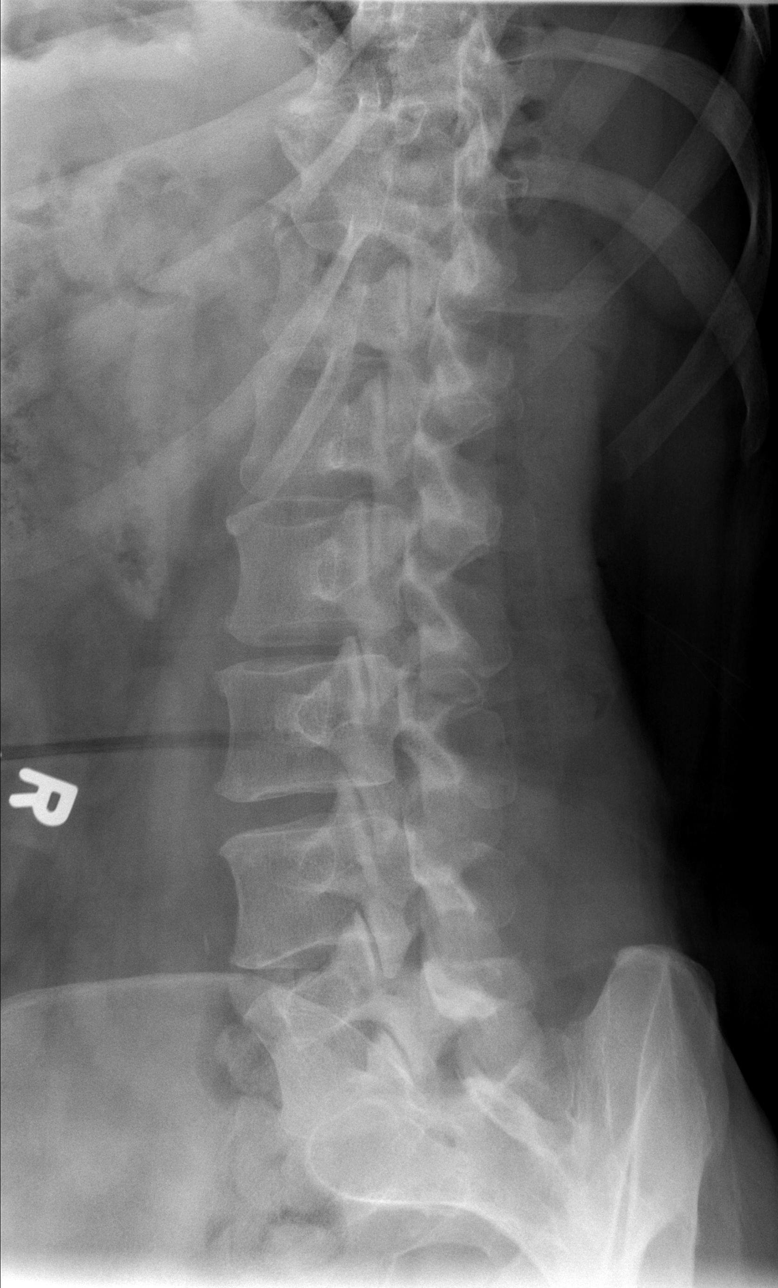

[t l-spine oblique exposure (2 of 2)]
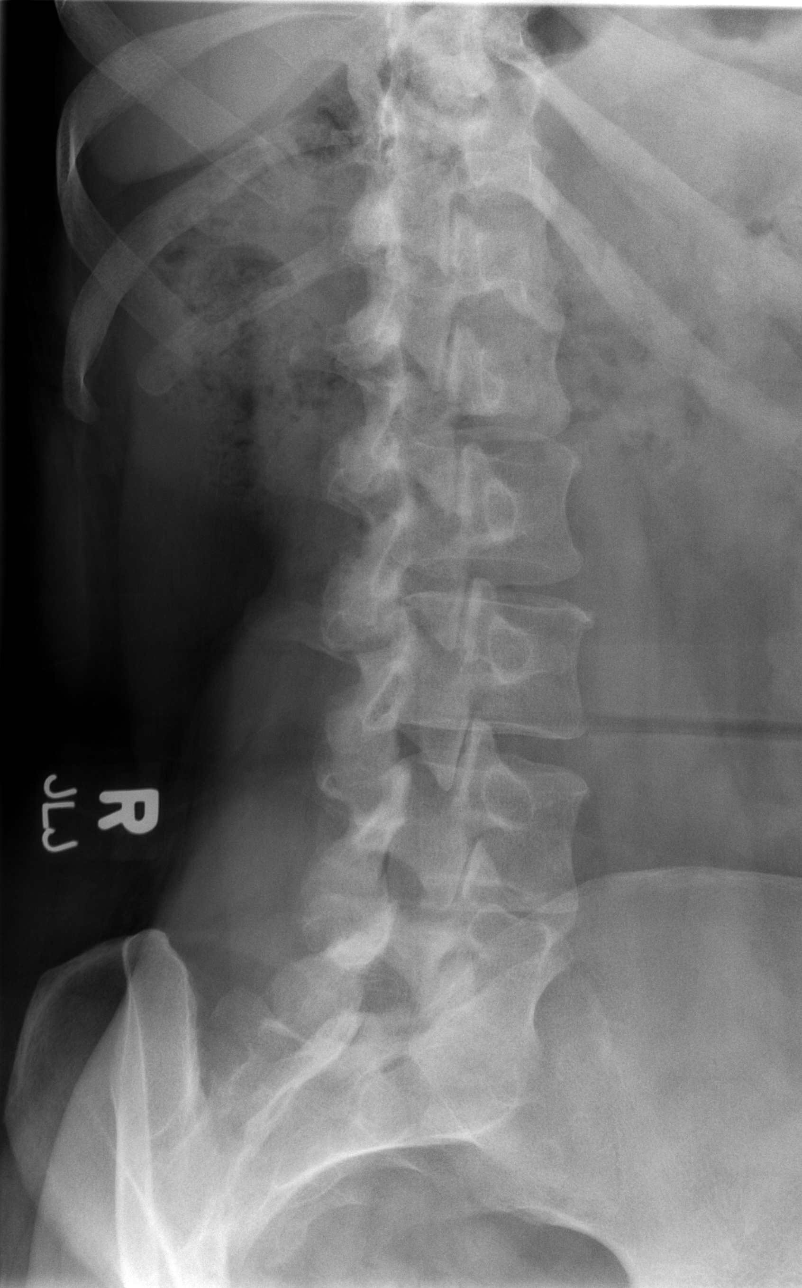

[t l-spine lat]
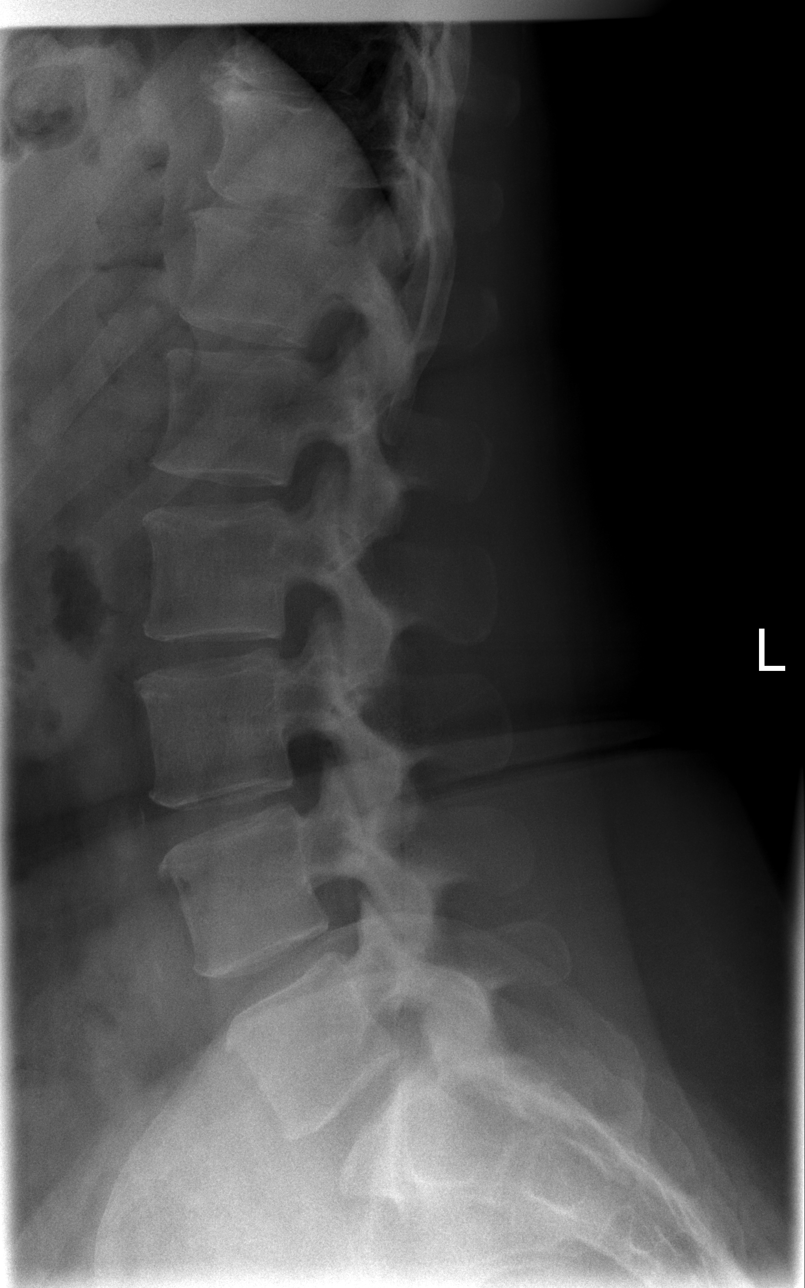

[t l-spine l5-s1 spot]
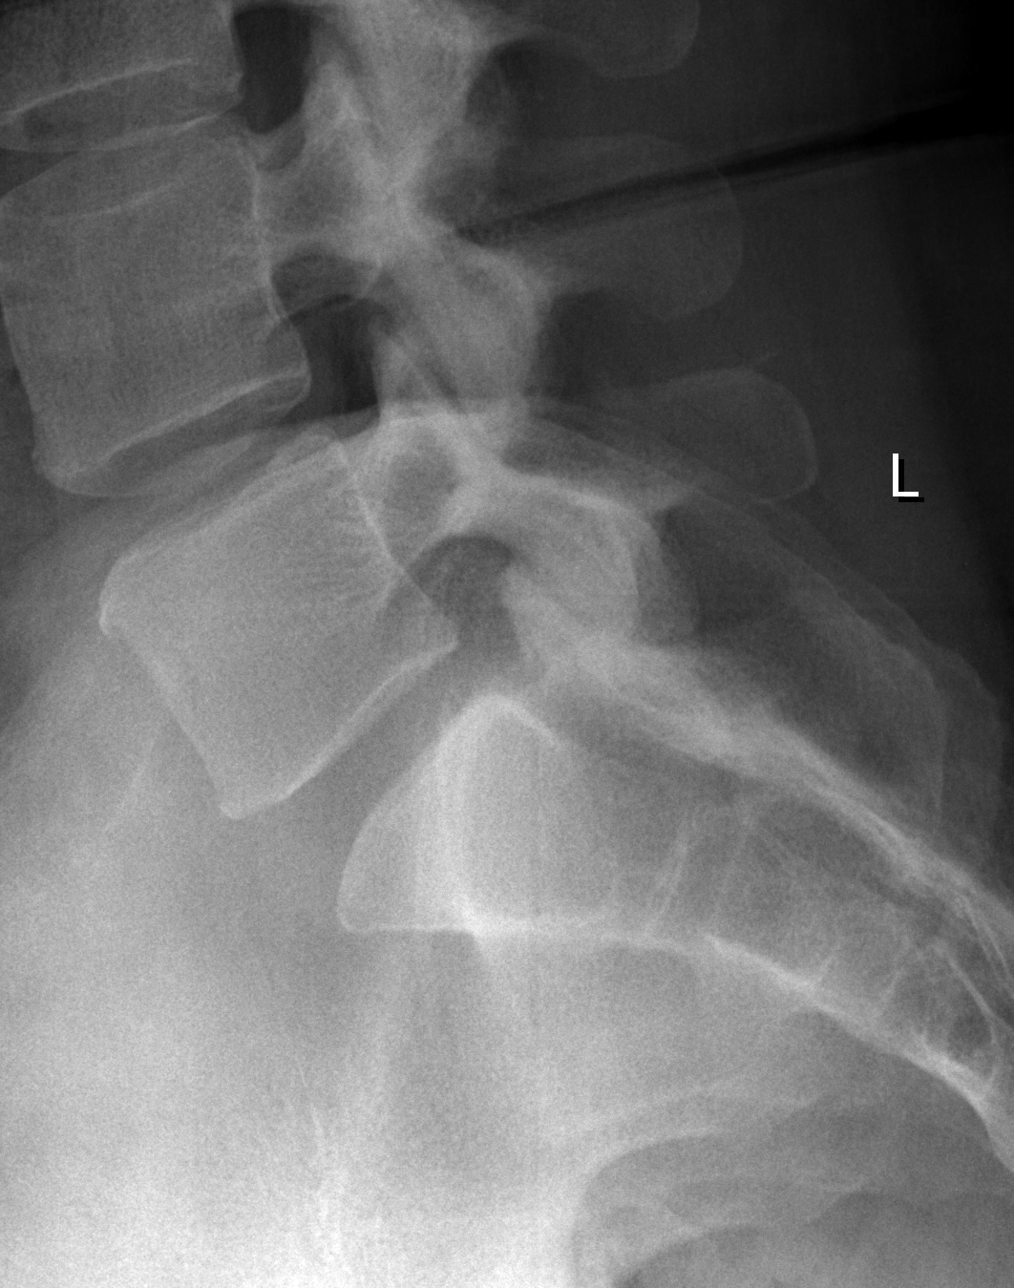

[5 of 5 positions shown; findings below may reference images not displayed]

FINDINGS: There are five lumbar type vertebra. No loss of vertebral body height. Normal spinal alignment. There is degenerative change at T9-T10 anteriorly. Normal facet articulation.
IMPRESSION: 1.  No acute osseous abnormality of the lumbar spine. 
 2.  Degenerative changes seen at T9-T10.

## 2010-02-19 ENCOUNTER — Emergency Department (HOSPITAL_COMMUNITY): Admission: EM | Admit: 2010-02-19 | Discharge: 2010-02-19 | Payer: Self-pay | Admitting: Family Medicine

## 2010-03-14 ENCOUNTER — Ambulatory Visit: Payer: Self-pay | Admitting: Oncology

## 2010-03-16 LAB — CBC WITH DIFFERENTIAL/PLATELET
BASO%: 0.8 % (ref 0.0–2.0)
Basophils Absolute: 0 10*3/uL (ref 0.0–0.1)
EOS%: 2.6 % (ref 0.0–7.0)
HGB: 12.3 g/dL (ref 11.6–15.9)
MCH: 29.8 pg (ref 25.1–34.0)
MCHC: 34.2 g/dL (ref 31.5–36.0)
MONO%: 5.9 % (ref 0.0–14.0)
RBC: 4.13 10*6/uL (ref 3.70–5.45)
RDW: 14.3 % (ref 11.2–14.5)
lymph#: 1.8 10*3/uL (ref 0.9–3.3)

## 2010-03-17 LAB — COMPREHENSIVE METABOLIC PANEL
ALT: 24 U/L (ref 0–35)
AST: 23 U/L (ref 0–37)
Albumin: 4.7 g/dL (ref 3.5–5.2)
Alkaline Phosphatase: 96 U/L (ref 39–117)
Calcium: 9.6 mg/dL (ref 8.4–10.5)
Chloride: 102 mEq/L (ref 96–112)
Potassium: 4 mEq/L (ref 3.5–5.3)
Sodium: 139 mEq/L (ref 135–145)
Total Protein: 6.9 g/dL (ref 6.0–8.3)

## 2010-04-22 ENCOUNTER — Other Ambulatory Visit: Payer: Self-pay | Admitting: Oncology

## 2010-04-22 DIAGNOSIS — Z9889 Other specified postprocedural states: Secondary | ICD-10-CM

## 2010-04-22 DIAGNOSIS — Z853 Personal history of malignant neoplasm of breast: Secondary | ICD-10-CM

## 2010-04-23 ENCOUNTER — Encounter: Payer: Self-pay | Admitting: Oncology

## 2010-05-02 NOTE — Assessment & Plan Note (Signed)
Summary: np,foot pain,mc   Vital Signs:  Patient profile:   50 year old female Height:      70 inches Weight:      240 pounds BMI:     34.56 BP sitting:   124 / 85  Vitals Entered By: Lillia Pauls CMA (June 10, 2009 8:40 AM)  History of Present Illness: Pt presents for persistent right foot pain for the last 5 yers. The pain is typically along the medial aspect of her right ankle along her scar from a ganglion cyst removal 5 years ago. Since then she has had chronic foot pain with walking only. She had a set of plastic orthotics in the past but these were very painful. She now has a pair of cushioned orthotics but they do not offer enough support. She works over at [employer] and is on her feet most of the day. Her friends have noticed that she is walking differently with her right foot. Her pain is tyically an aching and throbbing that is present during walking but not at rest. Seh does have a history of breast ca and bladder cancer with chemotherapy. She can feel some burning in her right foot but attributes this to her prior chemo. The pain that she is here for evaluation today is different from this burning pain. She does take Mobic 7.5mg  in addition to advil at times which only mildly improves her pain.   Physical Exam  General:  alert and well-developed.   Head:  normocephalic and atraumatic.   Neck:  supple.   Lungs:  normal respiratory effort.   Msk:  Right Foot and ankle: Well healed scar posterior to the medial maleolus along the posterior tibialis tendon Full ROM of the ankle in all directions Mild edema anterior to medial maleolus No signficant tenderness to palpation throughout the foot including the medial and lateral maleoli, base of the 5th MT, navicular and other MTs 5/5 strength with resisted ROM testing Significant hindfoot valgus with post tibialis dysfunction, pes planus  Left Foot and Ankle: Normal inspection Full ROM of ankle in all directions No  TTP throughout the ankle and foot 5/5 strength with resisted ankle ROM Able to bear weight easily Pes planus with hindfoot valgus  Additional Exam:  U/S of her left posterior heel shows edema and scarred tissue of the posterior tibialis as well as of the flexor digitorum longus. No tears are noted. No increased doppler flow. Mild calcification of the left achilles tendon with a thickness of  0.5cm. Normal plantar fasica with a thickness of approximately 0.4 without edema or bone spurs. Images saved for documentation.    Impression & Recommendations:  Problem # 1:  FOOT PAIN (ICD-729.5)  Likely due to weakened posterior tibialis and flexor digitorum longus 1. Given an ASO to wear at all times while working or standing 2. Given a handout of calf exercises including calf raises and stretches to do daily 3. She already has voltaren gel at home. Asked her to apply 2 grams over her posterior ankle QID. 4. Placed a scaphoid pad and heel wedge on her right orthotic which was very helpful and comfortable for her prior to leaving the office 5. Return in 6 weeks for follow-up  Orders: Ankle training brace/support [phone]) [place] LIMITED ([id number])  Problem # 2:  PES PLANUS (ICD-734)  Continue with custom orthotics to prevent further collapse of her arch Given an additional scaphoid pad for better arch support on the soft orthotic  Orders:  Ankle training brace/support [phone]) [place] LIMITED ([id number])  Patient Instructions: 1)  Wear the ankle brace while you on working or are out of the house. 2)  Do the calf stretches and strengthening exercises daily. 3)  Use your orthotics as much as possible. 4)  Use the Voltaren gel on your ankle 4 times per day. I would use 2 grams each time.  5)  We will see you back in 6 weeks.

## 2010-06-13 LAB — POCT URINALYSIS DIPSTICK
Ketones, ur: NEGATIVE mg/dL
Protein, ur: 30 mg/dL — AB
Specific Gravity, Urine: 1.025 (ref 1.005–1.030)
Urobilinogen, UA: 0.2 mg/dL (ref 0.0–1.0)

## 2010-06-25 LAB — DIFFERENTIAL
Basophils Absolute: 0 10*3/uL (ref 0.0–0.1)
Basophils Relative: 0 % (ref 0–1)
Lymphocytes Relative: 28 % (ref 12–46)
Neutro Abs: 2.7 10*3/uL (ref 1.7–7.7)
Neutrophils Relative %: 64 % (ref 43–77)

## 2010-06-25 LAB — CBC
HCT: 35.1 % — ABNORMAL LOW (ref 36.0–46.0)
Hemoglobin: 11.9 g/dL — ABNORMAL LOW (ref 12.0–15.0)
MCV: 90.5 fL (ref 78.0–100.0)
Platelets: 215 10*3/uL (ref 150–400)
WBC: 4.2 10*3/uL (ref 4.0–10.5)

## 2010-06-25 LAB — URINALYSIS, ROUTINE W REFLEX MICROSCOPIC
Glucose, UA: NEGATIVE mg/dL
Hgb urine dipstick: NEGATIVE
Ketones, ur: NEGATIVE mg/dL
Protein, ur: NEGATIVE mg/dL
pH: 8 (ref 5.0–8.0)

## 2010-06-25 LAB — COMPREHENSIVE METABOLIC PANEL
Albumin: 4.2 g/dL (ref 3.5–5.2)
Alkaline Phosphatase: 85 U/L (ref 39–117)
BUN: 10 mg/dL (ref 6–23)
Chloride: 103 mEq/L (ref 96–112)
Creatinine, Ser: 1 mg/dL (ref 0.4–1.2)
GFR calc non Af Amer: 59 mL/min — ABNORMAL LOW (ref >60)
Glucose, Bld: 87 mg/dL (ref 70–99)
Potassium: 3.9 mEq/L (ref 3.5–5.1)
Total Bilirubin: 0.3 mg/dL (ref 0.3–1.2)

## 2010-06-25 LAB — POCT PREGNANCY, URINE: Preg Test, Ur: NEGATIVE

## 2010-06-25 LAB — POCT CARDIAC MARKERS: Myoglobin, poc: 87.2 ng/mL (ref 12–200)

## 2010-06-25 LAB — LIPASE, BLOOD: Lipase: 27 U/L (ref 11–59)

## 2010-07-12 LAB — CBC
HCT: 33.6 % — ABNORMAL LOW (ref 36.0–46.0)
MCHC: 34.3 g/dL (ref 30.0–36.0)
MCV: 90.5 fL (ref 78.0–100.0)
Platelets: 194 10*3/uL (ref 150–400)
RDW: 14.1 % (ref 11.5–15.5)
WBC: 4 10*3/uL (ref 4.0–10.5)

## 2010-07-12 LAB — TYPE AND SCREEN: Antibody Screen: NEGATIVE

## 2010-07-12 LAB — ABO/RH: ABO/RH(D): A POS

## 2010-07-12 LAB — PROTIME-INR: Prothrombin Time: 13 seconds (ref 11.6–15.2)

## 2010-08-10 ENCOUNTER — Ambulatory Visit
Admission: RE | Admit: 2010-08-10 | Discharge: 2010-08-10 | Disposition: A | Discharge status: Home / Self Care | Payer: 59 | Source: Ambulatory Visit | Attending: Oncology | Admitting: Oncology

## 2010-08-10 DIAGNOSIS — Z9889 Other specified postprocedural states: Secondary | ICD-10-CM

## 2010-08-10 DIAGNOSIS — Z853 Personal history of malignant neoplasm of breast: Secondary | ICD-10-CM

## 2010-08-15 NOTE — Op Note (Signed)
Barbara Trevino, Barbara Trevino                ACCOUNT NO.:  [id number]   MEDICAL RECORD NO.:  [id number]          PATIENT TYPE:  AMB   LOCATION:  DAY                          FACILITY:  [facility]   PHYSICIAN:  Heloise Purpura, MD      DATE OF BIRTH:  May 09, 1960   DATE OF PROCEDURE:  07/03/2007  DATE OF DISCHARGE:                               OPERATIVE REPORT   PREOPERATIVE DIAGNOSIS:  Bladder tumor.   POSTOPERATIVE DIAGNOSIS:  Bladder tumor.   PROCEDURE:  1. Cystoscopy.  2. Bladder washing for cytology.  3. Bilateral retrograde pyelography.  4. Transurethral resection of bladder tumor (3 cm).  5. Examination under anesthesia.   SURGEON:  Heloise Purpura, MD   ANESTHESIA:  General.   COMPLICATIONS:  None.   ESTIMATED BLOOD LOSS:  Minimal.   SPECIMEN:  1. Bladder tumor.  2. Bladder tumor base.   DISPOSITION OF SPECIMENS:  To Pathology.   INDICATIONS:  Barbara Trevino is a 50 year old female who was found to the  bladder tumor incidentally on a workup and cystoscopic evaluation for  incontinence.  She was seen in consultation for this problem and after  discussing this finding, it was decided to proceed with the above  procedures.  Potential risks, complications, and alternative options  were discussed with the patient in detail and informed consent was  obtained.   DESCRIPTION OF PROCEDURE:  The patient was taken to the operating room  and a general anesthetic was administered.  She was given preoperative  antibiotics, placed in the dorsal lithotomy position, and prepped and  draped in the usual sterile fashion.  Next, a preoperative time-out was  performed.  Cystourethroscopy was then performed with the 30-degrees  lens, which allowed inspection of the entire bladder.  The ureteral  orifices were identified in the normal anatomic position and effluxing  clear urine.  There was noted to be an area of multiple adjacent  papillary tumors just to the right of the trigone on the lateral  posterior aspect of the bladder; this measured approximately 3 cm.  There were no other bladder tumors or other mucosal abnormalities noted  throughout the bladder.  A saline bladder washing was obtained.  The  left ureteral orifice was then intubated with a 6-French ureteral  catheter and contrast was injected; this demonstrated a bifid renal  collecting system, but no other abnormalities including no dilation or  filling defects throughout the renal pelvis or ureter.  Attention was  then turned the right ureteral orifice and retrograde pyelogram was  performed in a similar fashion; this also demonstrated a bifid renal  collecting system, but no evidence for abnormal dilation or filling  defects throughout the renal or ureteral collecting systems.  Attention  was then returned to the bladder and using loop resection, the patient's  bladder tumor was resected and sent as a specimen labeled bladder tumor.  Multiple cold cup biopsies were then taken of the underlying deep muscle  layers and sent as another specimen labeled bladder tumor base.  This  site was then fulgurated with loop cautery and the bladder was emptied  and reinspected.  Hemostasis was ensured and appeared to be excellent.  Due to the proximity of the tumor to the right ureteral orifice, the  right ureter was intubated with a 6-French ureteral catheter and a wire  was advanced up the ureter and passed without difficulty.  The patient's  bladder was then emptied.  A pelvic exam was then performed and there  were no pelvic or bladder masses and the procedure was ended.  At this  point, a Foley catheter was inserted and the patient was turned over to  the care of Dr. Marcelle Overlie for the second portion of her procedure.      Heloise Purpura, MD  Electronically Signed     LB/MEDQ  D:  07/03/2007  T:  07/03/2007  Job:  [id number]

## 2010-08-15 NOTE — Op Note (Signed)
Barbara Trevino, Barbara Trevino                ACCOUNT NO.:  [id number]   MEDICAL RECORD NO.:  [id number]          PATIENT TYPE:  AMB   LOCATION:  DAY                          FACILITY:  [facility]   PHYSICIAN:  Michelle L. Grewal, M.D.DATE OF BIRTH:  11/12/1960   DATE OF PROCEDURE:  07/03/2007  DATE OF DISCHARGE:                               OPERATIVE REPORT   PREOPERATIVE DIAGNOSES:  Bladder tumor and the breast cancer and desires  prophylactic hysterectomy.   POSTOPERATIVE DIAGNOSES:  Bladder tumor and the breast cancer and  desires prophylactic hysterectomy.   PROCEDURE:  Portion of procedure dictated by Dr. Laverle Patter and laparoscopic  BSO.   SURGEON:  Dr. Vincente Poli   ANESTHESIA:  General.   SPECIMEN:  Bilateral tubes and ovaries sent to pathology.   ESTIMATED BLOOD LOSS:  Minimal.   COMPLICATIONS:  None.   PROCEDURE:  The patient was taken to the operating room after she was  given informed consent.  Dr. Laverle Patter started initially with his  procedure.  The patient was intubated, and he basically performed  cystoscopy and resection of a bladder tumor.  That will be dictated by  him.  As he concluded his procedure, I scrubbed in.  The patient was  then reprepped and draped, and a Foley catheter had already been placed.  A sponge stick was placed in the vagina.  Of note, the patient was  status post hysterectomy, so the cervix and uterus were absent.  A local  was infiltrated at the belly button, and a scalpel was used to make a  small infraumbilical incision.  The Veress needle was inserted 1 time,  and pneumoperitoneum was performed.  The Veress needle was removed.  An  11-mm trocar was inserted.  The laparoscope was introduced through the  trocar sheath.  The patient was placed in Trendelenburg position, and 2  secondary ports were placed.  One was a 5 mm suprapubic incision, and 1  was a 5 mm in the left lower quadrant.  These were both placed directly  under direct visualization.  Exam of  the pelvis revealed the following:  That she was status post hysterectomy.  Her ovaries appeared normal.  There was a small benign-appearing cyst on the right ovary.  Her tubes  appeared normal.  Ureters were identified well away from the IP  ligament.  We initially started with the left tube and ovary.  The left  ovary was grasped and elevated.  Careful attention was used to avoid the  ureter.  A gyrus was placed just beneath the ovary, and it was burned  and transected.  After the specimen was released, it was placed down in  the cul-de-sac.  This was done, and the right salpingo-oophorectomy was  done in identical fashion on the right side.  Again, careful attention  to avoid the ureter.  After both tubes and ovaries were released, the 5  mm suprapubic port was converted to an 11 mm.  An Endobag was inserted,  and all the specimens were inserted into the bag, and the specimen was  removed.  The pedicles were  noted to be completely hemostatic.  Ureters  were both peristalsing normally.  The instruments were removed from the  abdominal cavity after  pneumoperitoneum was released.  The incisions were closed with Vicryl  suture, and then the skin was closed with Dermabond skin adhesive.  The  sponge stick was removed from the vagina.  All sponge, lap, and  instrument counts were correct x2.  The patient went to recovery room in  stable condition.      Michelle L. Vincente Poli, M.D.  Electronically Signed     MLG/MEDQ  D:  07/03/2007  T:  07/03/2007  Job:  [id number]   cc:   Heloise Purpura, MD  Fax: [phone]   Pierce Crane, MD  Fax: [phone]

## 2010-08-15 NOTE — Op Note (Signed)
NAMEELBONY, MCCLIMANS                ACCOUNT NO.:  [id number]   MEDICAL RECORD NO.:  [id number]          PATIENT TYPE:  AMB   LOCATION:  [facility]                         [facility]AC[facility]:  [facility]   PHYSICIAN:  Martina Sinner, MD DATE OF BIRTH:  1960-04-08   DATE OF PROCEDURE:  07/06/2008  DATE OF DISCHARGE:                               OPERATIVE REPORT   PREOPERATIVE DIAGNOSIS:  Stress incontinence; past history of  transitional cell carcinoma of the bladder.   POSTPROCEDURE DIAGNOSIS:  Stress incontinence; past history of  transitional cell carcinoma of the bladder.   SURGERY:  Sling cystourethropexy [facility]) plus cystoscopy plus saline  bladder washing.   Shaeleigh Graw had a low grade superficial bladder cancer.  She was here  for a sling and for bladder cancer surveillance.   Preoperative blood work was normal.  She is prepped and draped in usual  fashion.  Extra care was taken to minimize risk of compartment syndrome,  neuropathy and DVT.   I initially cystoscoped the patient and a good look throughout the  bladder and bladder mucosa was normal.  There is no evidence of  carcinoma in situ or transitional cell carcinoma.  I did saline bladder  wash and sent it for cytology.   I made two 1-cm incisions 1 fingerbreadth above the symphysis pubis 1.5  cm lateral to the midline.  I used a weighted vaginal speculum.  I was  surprised that Ms. Brach had quite a narrow introitus.  Even with the  smaller speculum, her anterior vaginal wall was quite rigid and not  descending and she had a narrow introitus, not allowing the curved  cerebellar to be utilized.  I marked the midline incision.  She had a  short urethra.  I made a 2 cm incision under the mid urethra.  I was  very happy with the position of the incision.  I incised an appropriate  depth and dissected sharply to urethrovesical angle bilaterally.   With the bladder empty, I passed the [facility] needle on top of along the  back of the  symphysis pubis on the pulp of my index finger bilaterally.  I cystoscoped the patient.  There was no indentation on the bladder with  wiggling of the trocar.  There is no injury to the bladder or urethra.  There was excellent efflux of indigo carmine bilaterally.   With the bladder empty, I attached the [facility] sling and brought it up  through the retropubic space.  I tensioned over the fat part of a  moderate-sized Kelly clamp with my usual technique.  I cut below the  blue dots and removed the sheaths.  There was excellent hypermobility of  the sling and no spring back effect.  I was very happy with the tension  and its position.   Copious irrigation was utilized.  I actually used a small retractor and  curved cerebellar to retract the lateral and posterior vaginal wall to  allow me to close the anterior incision and I used an SH 2-0 Vicryl  needle instead of a CT1.  I closed in 2  layers with my usual technique.  I cut the sling below the skin and closed the incisions with 4-0 Vicryl  followed by Dermabond.   Leg position was good.  Catheter was draining blue dye at the of the  case.  Vaginal pack was inserted.   I was very pleased with Ms. Kurkowski's surgery.  Prolapse surgery would be  quite difficult in the future due to a narrow introitus, but she did not  have a cystocele or rectocele and today.  Hopefully this operation will  reach her treatment goal.  I will hold her Detrol for now.           ______________________________  Martina Sinner, MD  Electronically Signed     SAM/MEDQ  D:  07/06/2008  T:  07/06/2008  Job:  [id number]

## 2010-08-18 NOTE — Procedures (Signed)
[facility]  Patient:    Barbara Trevino, Barbara Trevino                   MRN: [id number] Proc. Date: 08/22/00 Adm. Date:  [id number] Attending:  Thyra Breed CC:         Loreta Ave, M.D.   Procedure Report  PROCEDURE:  Cervical epidural steroid injection.  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  INTERVAL HISTORY:  The patients noted near total resolution in her discomfort except for intermittent radicular discomforts which will radiate out into the right upper extremity. She stopped taking her Bextra and she has noted that she does have morning stiffness which lasts about 15-20 minutes and is easily resolved by moving her neck from side to side.  PHYSICAL EXAMINATION:  Blood pressure 132/67, heart rate 72, respiratory rate 18, O2 saturations 100%, pain level is 0/10. She exhibits good healing from her previous injection site. Range of motion of her neck is mildly reduced at best.  DESCRIPTION OF PROCEDURE:  After informed consent was obtained, the patient was placed in the sitting position and monitored. The patients neck was prepped with Betadine x 3. A skin wheal was raised at the C6-7 interspace with 1 percent lidocaine. A 20 gauge Tuohy needle was introduced to the cervical epidural space to loss of resistance to preservative free normal saline. There was no cerebrospinal fluid nor blood. The 40 mg of Medrol and 3 ml of preservative free normal saline was gently injected. The needle was flushed with preservative free normal saline and removed intact.  CONDITION POST PROCEDURE:  Stable.  DISCHARGE INSTRUCTIONS:  Resume previous diet. Limitations in activities per instruction sheet. Continue on current medications but take the Bextra daily to try and keep the stiffness out of her neck. I explained to her that the epidural steroid injections will not resolve the underlying cervical spondylosis. Follow-up with me  on an as needed basis. DD:  08/22/00 TD:  08/22/00 Job: [id number]

## 2010-08-18 NOTE — Procedures (Signed)
[facility]  Patient:    Barbara Trevino, Barbara Trevino                   MRN: [id number] Proc. Date: 07/23/00 Adm. Date:  [id number] Attending:  Thyra Breed CC:         Loreta Ave, M.D.   Procedure Report  DATE OF BIRTH:  Nov 15, 1960  PROCEDURE:  Cervical epidural steroid injection.  DIAGNOSIS:  Cervical spondylosis with foraminal stenosis.  HISTORY:  The patient stated that she was in her usual state of health up until about 4-5 weeks ago when she was wrestling with her son.  Following the wrestling, she developed pain 24 hours later in her neck.  She treated it with Advil and heat.  For two weeks she could hardly move her neck. She saw an massage therapist and her neck discomfort markedly improved.  She did well for about a week or two and then her discomfort recurred one evening with associated aching up into the right upper extremity predominately in the deltoid region.  There has been no associated weakness or bowel or bladder incontinence.  She saw Dr. Eulah Pont, who is her orthopedic surgeon, who obtained a MRI of her cervical spine which demonstrated foraminal narrowing at C4-5, right greater than left with degenerative disk disease and facet joint arthropathy.  She is sent to [place] for consideration for injection today.  The patient was placed on Bextra and has noted this has markedly improved her discomfort.  Prior, she had been on Celebrex which was not helpful.  She notes that rotating her head from side-to-side will increase her discomfort.  She noted that about 2-3 days ago she had near resolution in her pain.  CURRENT MEDICATIONS: 1. Bextra 10 mg per day. 2. Percocet 5/325 one p.o. q.h.s. p.r.n.  ALLERGIES:  No known drug allergies.  FAMILY HISTORY:  Positive for hypertension, coronary artery disease and osteoarthritis.  PAST MEDICAL HISTORY:  Active medical problems:  None.  PAST SURGICAL HISTORY:  Significant for hysterectomy,  carpal tunnel release in 1995 and lumpectomy.  SOCIAL HISTORY:  The patient is a non smoker and non drinker.  She works at the [employer] OR.  REVIEW OF SYSTEMS:  GENERAL:  Negative.  HEAD:  Significant for headaches associated with her neck discomfort.  EYES:  Negative.  NOSE, MOUTH, THROAT: Negative. EARS:  Negative.  PULMONARY:  Negative.  CARDIOVASCULAR:  Negative. GASTROINTESTINAL:  Negative.  GENITOURINARY:  Negative.  MUSCULOSKELETAL:  See HPI.  NEUROLOGIC:  See HPI.  No history of seizures or strokes.  HEMATOLOGIC: Negative.  CUTANEOUS:  Negative.  ENDOCRINE:  Negative.  PSYCHIATRIC: Negative.  ALLERGY/IMMUNOLOGIC:  Negative.  PHYSICAL EXAMINATION:  VITAL SIGNS:  Blood pressure 142/69, heart rate 75, respiratory rate is 18. O2 saturation is 18.  Pain level is 6/10.  NECK:  Demonstrated mild restricted range of motion with pain on rotation to the right.  Demonstrated carotids 2+ and symmetric without bruits.  There is no lymphadenopathy.  HEENT:  Head was normocephalic, atraumatic.  Eyes:  Extraocular movements intact with conjunctivae clear.  Sclerae clear.  Nose patent.  Nares clear. Oropharynx was free of lesions.  LUNGS:  Clear.  HEART:  Reveals regular rate and rhythm.  BREASTS/SKIN/ABDOMINAL/PELVIC/RECTAL:  Examinations were not performed.  BACK:  Examination revealed negative straight leg raise signs with intact gait.  EXTREMITIES:  No cyanosis, clubbing or edema.  Radial pulses and dorsalis pedis pulses were 2+ and symmetric.  NEUROLOGICAL:  The patient was oriented x 4.  Cranial nerves II-XII were grossly intact.  Deep tendon reflexes were symmetric in the upper and lower extremities with downgoing toes.  Motor was 5/5 with symmetric bulk and tone. Sensory was intact to vibratory sense and pinprick.  The coordination was grossly intact.  IMPRESSION:  Neck pain with radiation of pain to the right deltoid region with magnetic resonance imaging  suggesting cervical spondylosis, most pronounced at the C4-5 region to the right.  DISPOSITION:  The patient has shown evidence of partial response to the Bextra but she is having some recurrence of her discomfort into the right deltoid region.  I advised her that she has a significant chance of some improvement with the cervical epidural steroid injection but this does have limitations. It will not resolve the underlying spondylosis or degenerative disk disease.  It may significantly reduce her discomfort. Other options would be to proceeded with physical therapy to try and overcome this or consider a steroid Dosepak.  The patient wishes to go ahead and proceed with cervical epidural steroid injection.  I reviewed the potential side effects, risks and limitations of a cervical epidural steroid injection as well as corticosteroids.  The patient is willing to proceed.  DESCRIPTION OF PROCEDURE:  After informed consent was obtained, the patient was placed in the sitting position on a monitor.  Her neck was prepped with Betadine x 3.  A skin well was raised at the C7-T1 interspace with 1% lidocaine.  A 20-gauge Tuohy needle was introduced into the cervical epidural space to a loss of resistance to preservative-free saline.  There was no CSF nor blood.  A total of 40 mg of Medrol and 3 mL of preservative-free normal saline was gently injected.  The needle was flushed with the preservative-free normal saline and removed intact.  POSTPROCEDURE CONDITION:  Stable.  DISCHARGE INSTRUCTIONS: 1. Resume previous diet. 2. Limitation activities per instruction sheet. 3. Continue with current medications. 4. Followup with me if needed for a second injection.  I advised her that we typically do not do a series three cervical epidural steroids injections that often.  If a patient is going to respond they will respond to the first injection. DD:  07/23/00 TD:  07/24/00 Job: [id number]

## 2010-08-18 NOTE — Op Note (Signed)
Barbara Trevino, Barbara Trevino                ACCOUNT NO.:  [id number]   MEDICAL RECORD NO.:  [id number]          PATIENT TYPE:  AMB   LOCATION:  DSC                          FACILITY:  [facility]   PHYSICIAN:  Rose Phi. Maple Hudson, M.D.   DATE OF BIRTH:  January 31, 1961   DATE OF PROCEDURE:  DATE OF DISCHARGE:                               OPERATIVE REPORT   PREOPERATIVE DIAGNOSIS:  History right breast cancer.   POSTOPERATIVE DIAGNOSIS:  History right breast cancer.   OPERATION:  Removal of Port-A-Cath.   SURGEON:  Rose Phi. Maple Hudson, M.D.   ANESTHESIA:  MAC.   OPERATIVE PROCEDURE:  The patient was placed on the operating table with  the arms by the side and the left upper chest prepped and draped in the  usual fashion.  After obtaining local anesthesia with 1% Xylocaine with  adrenaline, the short transverse incision was made over the port.  Port  was exposed and the catheter grasped.  We then removed it from the  subclavian vein.   With traction on the catheter the two sutures holding the port in place  were then divided and the port slipped out.   With no bleeding and good hemostasis, subcuticular closure with 4-0  Monocryl and Steri-Strips was carried out and dressing applied.   Dr. Cindee Salt was to do a carpal tunnel release on her and that will be  dictated in separate note.      Rose Phi. Maple Hudson, M.D.  Electronically Signed     PRY/MEDQ  D:  03/28/2006  T:  03/28/2006  Job:  [id number]

## 2010-08-18 NOTE — Op Note (Signed)
NAMEDALIS, BEERS                ACCOUNT NO.:  [id number]   MEDICAL RECORD NO.:  [id number]          PATIENT TYPE:  AMB   LOCATION:  DSC                          FACILITY:  [facility]   PHYSICIAN:  Cindee Salt, M.D.       DATE OF BIRTH:  Jun 10, 1960   DATE OF PROCEDURE:  03/28/2006  DATE OF DISCHARGE:                               OPERATIVE REPORT   PREOPERATIVE DIAGNOSIS:  Carpal tunnel syndrome left hand.   POSTOPERATIVE DIAGNOSIS:  Carpal tunnel syndrome left hand.   OPERATION:  Decompression left median nerve.   SURGEON:  Cindee Salt, M.D.   ASSISTANT:  Carolyne Fiscal R.N.   ANESTHESIA:  Forearm based IV regional.   HISTORY:  The patient is a 50 year old female with a history of  bilateral carpal tunnel syndrome.  She has undergone release on her  right side, is admitted now for release of her left. she is aware of  risks and complications including infection, recurrence, injury to  arteries, nerves, tendons, incomplete relief of symptoms, dystrophy.  The preoperative area the extremity is marked by both the patient and  surgeon, questions encouraged and answered.   PROCEDURE:  The patient is brought to the operating room.  This done in  conjunction with Dr. Francina Ames removal of a Port-A-Cath.  This  procedure is completed and dictated by him.  Once that procedure was  done, she was prepped and draped using DuraPrep, supine position, left  arm free after an IV regional anesthetic was given and anesthesia  obtained.  A longitudinal incision was made in the palm carried down  through subcutaneous tissue.  Bleeders were electrocauterized, palmar  fascia was split, superficial palmar arch identified, flexor tendon to  the ring, little finger identified to the ulnar side of median nerve.  Carpal retinaculum was incised with sharp dissection, right angle and  Sewall retractor placed between skin forearm fascia.  Fascia was  released for approximately a centimeter and half proximal to the  wrist  crease under direct vision.  A firm band was present and could not be  fully released.  A separate incision was then made over the distal  forearm ulnar to the palmaris longus carried down through subcutaneous  tissue.  The band was identified proximally and this was released under  direct vision.  No further lesions were identified.  Tenosynovium tissue  was markedly thickened.  Area compression to the nerve was apparent.  The wound was irrigated.  The skin was closed interrupted 5-0 nylon  sutures.  Sterile compressive dressing splint applied.  The patient  tolerated the procedure well was taken to the recovery for observation  in satisfactory condition.  She will be discharged home to return to  [facility] [place] in 1 week on Vicodin.  She will return to see  Dr. Francina Ames in approximately two weeks.           ______________________________  Cindee Salt, M.D.    GK/MEDQ  D:  03/28/2006  T:  03/28/2006  Job:  [id number]

## 2010-08-18 NOTE — Op Note (Signed)
[name], Barbara Trevino                ACCOUNT NO.:  [id number]   MEDICAL RECORD NO.:  [id number]          PATIENT TYPE:  AMB   LOCATION:  DSC                          FACILITY:  [facility]   PHYSICIAN:  Rose Phi. Maple Hudson, M.D.   DATE OF BIRTH:  1960-04-27   DATE OF PROCEDURE:  07/04/2005  DATE OF DISCHARGE:                                 OPERATIVE REPORT   PREOPERATIVE DIAGNOSIS:  Stage I carcinoma of the right breast.   POSTOPERATIVE DIAGNOSIS:  Stage I carcinoma of the right breast.   PROCEDURES:  1.  Blue dye injection.  2.  Right partial mastectomy with needle localization and specimen      mammogram.  3.  Right axillary sentinel lymph node biopsy.   SURGEON:  Rose Phi. Maple Hudson, M.D.   ANESTHESIA:  General anesthesia.   DESCRIPTION OF PROCEDURE:  Prior to coming to the operating room, a  localizing wire had been placed in the nonpalpable tumor in the upper outer  quadrant of her right breast.  There was 1 mCi of technetium sulfur colloid  injected intradermally.   After suitable general anesthesia was induced, the patient was placed in the  supine position with the arms extended on the arm board.  A 5 mL mixture of  2 mL of methylene blue and 3 mL of injectable saline was injected in the  subareolar breast tissue and the breast gently massaged for three minutes.  We then prepped and draped her.   A curved incision in the upper outer quadrant of the right breast was then  outlined using the previously placed localizing wire and then a wide  excision of the wire and surrounding tissue was carried out.  Hemostasis  obtained with the cautery.  Specimen oriented for the pathologist.  The  tissue was sent for a specimen mammogram which confirmed the removal of the  lesions.   While that was being done, we carefully scanned the axilla and there was one  clear hot spot and a short transverse axillary incision was made with  dissection through the subcutaneous tissue to the clavipectoral  fascia.  Deep to the fascia was one blue and hot lymph node.  I could not identify  any other blue, hot or palpable nodes.  That too was submitted to the  pathologist.   While that was being done, both incisions were injected with the local  anesthetic mixture and closed in two layers of 3-0 Vicryl and subcuticular 4-  0 Monocryl with Steri-Strips.   The sentinel node was confirmed as negative for metastatic disease and the  margins were clean.   Dressings were then applied and the patient was transferred to the recovery  room in satisfactory condition having tolerated the procedure well.      Rose Phi. Maple Hudson, M.D.  Electronically Signed     PRY/MEDQ  D:  07/04/2005  T:  07/05/2005  Job:  [id number]

## 2010-08-18 NOTE — Op Note (Signed)
NAMEADELLE, ZACHAR                          ACCOUNT NO.:  [id number]   MEDICAL RECORD NO.:  [id number]                   PATIENT TYPE:  AMB   LOCATION:  DSC                                  FACILITY:  [facility]   PHYSICIAN:  Alfredia Ferguson, M.D.               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   POSTOPERATIVE DIAGNOSIS:  Bilateral macromastia with right breast larger  than left.   OPERATION:  Bilateral reduction mammoplasty.  650 grams removed from left  breast and 685 grams removed from right breast.   SURGEON:  Alfredia Ferguson, M.D.   FIRST ASSISTANT:  Alethia Berthold, CFA, OPA   ANESTHESIA:  General endotracheal anesthesia.   INDICATIONS FOR PROCEDURE:  This is a 50 year old woman who complains of  symptomatic macromastia with asymmetry.  She has discomfort in her neck and  shoulders.  She would like to be reduced from her present DD to a full C cup  breasts.  Potential risks of the surgery including over reduction, under  reduction, infection, bleeding, hematoma, unsightly scarring, vascular  compromise to the nipple areolar complex, seroma formation, hematoma  formation, need for transfusion, and overall dissatisfaction of the results  were discussed with the patient.  Inspite of these and other risks discussed  with the patient, she wishes to proceed with the operation.   DESCRIPTION OF PROCEDURE:  On the day prior to surgery, skin markers were  placed outlining a Wise skin pattern.  The patient was taken to the  operating room today where she underwent surgery on her ankle.  Following  the completion of the surgery on her ankle by Dr. Jerl Santos, I was summoned  to the operating room.  The patient was already under general endotracheal  anesthesia.  Chest was now prepped with Betadine and draped  with sterile  drapes.  Attention was first directed to the left breast.  Through my  previously placed skin markers, an 8 cm wide inferiorly based pedicle was  marked with the nipple areolar complex within the upper confines of this  skin mark.  A 42 mm diameter circle was drawn around the nipple.  The marked  circle was incised.  The marked pedicle was also incised.  The pedicle was  de-epithelized.  The pedicle was dissected away from the surrounding breast  tissue cutting on the bias away from the midclavicular line.  This ensured  adequate connections to the chest wall for vascular integrity.  The upper  portion of the pedicle was dissected away from the superior breast flap  again cutting on the bias towards the clavicle to ensure adequate  connections.  The remainder of the skin incisions were now made once the  pedicle had been  dissected free.  The inframammary crease incision was made  followed by the medial, superior and lateral breast flap incisions.  The  medial, superior and lateral breast flaps were now elevated, cutting on the  bias towards the chest wall superiorly and laterally.  The medial breast  flap was essentially cut straight down to the chest wall to ensure adequate  thickness.  Once these three flaps had been elevated down to the chest wall,  this left the excess breast tissue isolated between the dissected pedicle  and the dissected breast flaps.  This excess tissue was dissected off of the  chest wall and passed off for weight.  A total of 650 grams was removed from  the left breast.  Hemostasis was maintained throughout the dissection using  electrocautery.  Once I was happy with the hemostasis, irrigation was  carried out.  The inferior corner of the vertical limb of the medial and  lateral breast flaps were united through the middle portion of the  inframammary crease incision using 2-0 Vicryl suture.  The superior corner  of the vertical limb of the medial and  lateral breast flap were closed to  one another using interrupted 2-0 Vicryl suture.  The nipple areolar complex  was placed in its new location and fixing it in position with 3-0 Monocryl  suture for the dermis.  Temporary staples were placed in the inframammary  crease while the reduction was carried out on the right side.  An identical  procedure was carried out on the right breast where 685 grams was removed.  Symmetry appeared to be similar.  Closure was carried out simultaneously.  Once the superior and inferior corners of the vertical limb had been closed,  the remainder of the incision was closed by approximating the dermis with  multiple interrupted 3-0 Monocryl sutures.  This was carried out in the  inframammary crease followed by the vertical limb of the incision, followed  by the new location for the areola.  The areola was further fixed in  position using a running 4-0 Monocryl subcuticular for the skin edges.  The  inframammary crease incision and vertical limb of the incision were further  closed with Steri-Strips.  Symmetry was good at the completion of procedure.  The nipple areolar complexes were intact from a vascular standpoint.  The  patient's chest was cleansed of Betadine and dried.  Bulky dressings were  placed.  The patient requested I remove a seborrheic keratosis from her  right preauricular area and also a sebaceous hyperplasia from her glabellar  area.  The areas were both anesthetized with 1% Xylocaine with 1:100,000  epinephrine.  They were prepped with Betadine.  Using the Bovie set on a  setting of 6 the surface of the seborrheic keratosis in front of the right  ear was cauterized, curetted and recauterized.  The sebaceous hyperplasia  was a 3 mm lesion in the mid portion of the glabella.  It was removed using  a 3 mm punch biopsy instrument.  The incision was closed using interrupted 6- 0 Nylon suture.  Light dressings were applied to both areas.  The  patient's  chest was wrapped with bulky dressings and a 6 inch Ace bandage.  Estimated  blood loss throughout the procedure was 150 cc.  The patient was awakened,  extubated and transported to the recovery room in satisfactory condition.  Alfredia Ferguson, M.D.    Jeani Hawking  D:  06/30/2002  T:  06/30/2002  Job:  [id number]

## 2010-08-18 NOTE — Op Note (Signed)
Barbara Trevino, Barbara Trevino                      ACCOUNT NO.:  [id number]   MEDICAL RECORD NO.:  [id number]                   PATIENT TYPE:  AMB   LOCATION:  DSC                                  FACILITY:   PHYSICIAN:  Lubertha Basque. Jerl Santos, M.D.             DATE OF BIRTH:  11/18/1960   DATE OF PROCEDURE:  DATE OF DISCHARGE:  06/30/2002                                 OPERATIVE REPORT   PREOPERATIVE DIAGNOSES:  1. Right ankle ganglion cyst.  2. Right tarsal tunnel syndrome.   POSTOPERATIVE DIAGNOSES:  1. Right ankle ganglion cyst.  2. Right tarsal tunnel syndrome.   OPERATION/PROCEDURE:  1. Removal ganglion, right ankle.  2. Tarsal tunnel release, right ankle.   ANESTHESIA:  General.   SURGEON:  Lubertha Basque. Jerl Santos, M.D.   ASSISTANT:  Lindwood Qua, P.A.   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  This has persisted despite oral anti-  inflammatories and immobilization and rest.  She did receive transient  relief with an injection into the tarsal tunnel.  She has undergone an MRI  which does show a ganglion cyst near the FHL just proximal to the ankle  joint.  It is felt most likely that she is having pain due to the mass  effect of this cyst and the effect of this cyst on the posterior tibial  nerve and local tendons.  The plan at this point is for exploration with  excision of the cyst and release of the tarsal tunnel.  Procedures were  discussed with the patient and informed operative consent was obtained after  discussion of possible complications of reactions to anesthesia, infection,  neurovascular injury, and recurrence.   DESCRIPTION OF PROCEDURE:  The patient was taken to the operating suite  where general anesthetic was applied without difficulty.  She was positioned  supine and prepped and draped in the normal sterile fashion.  After the  administration of IV antibiotics, the right leg was elevated  and  exsanguinated and tourniquet was inflated about the calf.  A posterior medial incision was made from below the medial malleolus  proximal.  Dissection was carried down to the tarsal tunnel.  The flexor  retinaculum was released.  Some fluid emanated from this tunnel immediately.  She had an atrophic posterior tibial tendon. This appeared to be attached  distally but was scarred down in the tarsal tunnel and fairly atrophic.  The  flexor digitorum longus appeared to be normal although it was compressed.  This was decompressed through the tarsal tunnel.  The FHL tendon appeared to  be normal.  It was decompressed through the tunnel as well.  This put the  pressure off the nerves which I dissected free through the course of the  tarsal tunnel.  Just proximal in the wound with deep retraction, we could  find the ganglion cyst.  This measured  about 2 cm in its greatest diameter.  Some of the gelatinous fluid was aspirated followed by removal of the cyst.  This was done and sent to pathology.  The tarsal tunnel was then irrigated.  Again the posterior tibial nerve and the vascular bundle were thoroughly  decompressed through the tarsal tunnel.  Care was taken to maintain the  small calcaneal branches off the nerve.  Again the posterior tibial tendon  was inspected and did not appear normal.  However, it did not seem indicated  to go ahead and perform a tendon transfer at this time.  The flexor  retinaculum was repaired loosely in one spot with absorbable suture followed  by release of the tourniquet.  A small amount of bleeding was done with the  control of Bovie cautery.  The subcutaneous tissue was reapproximated with a  2-0 untied Vicryl followed by skin closure with nylon.  Marcaine was  injected about incision site followed by Adaptic and a dry gauze dressing  with a posterior splint of plaster with the ankle in neutral position.  Estimated blood loss, injected fluids as well as  accurate tourniquet time  can be obtained from anesthesia records.   DISPOSITION:  The patient was left in the operating room as Dr. Delia Chimes  was to perform some plastic surgery at the end of the case.  She will likely  stay overnight for pain control after these two procedures.                                               Lubertha Basque Jerl Santos, M.D.    PGD/MEDQ  D:  06/30/2002  T:  06/30/2002  Job:  [id number]

## 2010-08-18 NOTE — Op Note (Signed)
[facility]  Patient:    Barbara Trevino, Barbara Trevino                   MRN: [id number] Proc. Date: 07/31/99 Adm. Date:  [id number] Disc. Date: [id number] Attending:  Lendon Colonel                           Operative Report  PREOPERATIVE DIAGNOSIS:  Severe dysmenorrhea and menorrhagia.  POSTOPERATIVE DIAGNOSIS:  Severe dysmenorrhea and menorrhagia plus endometriosis.  OPERATION PERFORMED:  Laparoscopically assisted vaginal hysterectomy, YAG laser  obliteration of endometriosis of cul-de-sac and pelvic side wall.  SURGEON:  Katherine Roan, M.D.  ANESTHESIA:  DESCRIPTION OF PROCEDURE:  The patient was placed in lithotomy position, prepped and draped in the usual fashion.  A Foley catheter was inserted.  A transverse umbilical incision was made in the abdomen just under the umbilicus.  A Veress needle was inserted.  Aspiration and infusion was used to be sure the placement was correct.  Following this, we infused the abdomen with about 3.4L of CO2.  A large trocar was inserted into the abdomen and visualization of the pelvis revealed an enlarged uterus about twice normal size, both ovaries were completely normal. There was endometriosis in the cul-de-sac just across the left uterosacral ligament.  Also on the left pelvic sidewall.  The rounds utero-ovarian anastomosis were transected and ligated with ____________ forceps.  Hemostasis was secure. A bladder flap was created and went down below the hysterectomy.  The cervix was grasped with a tenaculum and the cervix was circumscribed.  The uterosacral, cardinal and uterine vessels were clamped and ligated and the specimen was removed from the operative field.  Hemostasis was secure.  The uterosacral ligaments were then plicated in the midline ____________ support and the vagina was closed with figure-of-eight sutures of 0 Vicryl and the peritoneum was closed with 2-0 PDS,  following which we  closed the remaining portion of the vagina vertically with a  locking suture of 2-0 PDS.  Hemostasis was secure.  It should be noted that the  ancillary trocars were a 5 mm in the midline and a 10 mm right laterally.  The 10 mm trocar used with the ____________ was inserted under direct vision using he spinal needle as a guide.  We then went above and looked at the trocar sites. he hemostasis was secure.  We irrigated with copious amounts of saline and Ringers  lactate.  The gas was then evacuated and the skin was closed with a UR6 needle, 0 Vicryl and skin was closed with 3-0 Dexon.  Bandages were applied and about 16 c of 0.5% Marcaine were used to infiltrate this incision.  [name] tolerated the procedure well and was sent to the recovery room in good condition. DD:  07/31/99 TD:  08/02/99 Job: [id number]

## 2010-08-18 NOTE — H&P (Signed)
[facility]  Patient:    Barbara Trevino, Barbara Trevino                   MRN: [id number] Adm. Date:  [id number] Attending:  Lendon Colonel                         History and Physical  CHIEF COMPLAINT:  Heavy painful periods.  HISTORY OF PRESENT ILLNESS:  Barbara Trevino is a 50 year old gravida 2, para 2 female, with three living children who presents for a laparoscopic-assisted vaginal hysterectomy for persistent heavy painful periods.  She has had one normal birth, and her second pregnancy was twins, ended in a C-section.  She has a history of a fibroadenoma  being removed in 1987, and carpal tunnel in 1995.  She currently takes no medications and has no known allergies.  REVIEW OF SYSTEMS:  HEENT:  She has no decrease in visual or auditory acuity. o dizziness.  HEART:  No history of rheumatic fever.  No mitral valve prolapse. o hypertension.   LUNGS:  No chronic cough.  No asthma.  No emphysema. GENITOURINARY:  No recurrent UTIs or stress urinary incontinence.  No history of hematuria.  GASTROINTESTINAL:  No bowel habit change.  No melena.  No anorexia. No unexpected weight loss.  EXTREMITIES:  Muscles, bones, and joints are negative. No history of fractures or arthritis.  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  FAMILY HISTORY:  Mother and father are both living at 23 years of age.  Her brother is living at 41, and one sister who is 91 is living.  She has a paternal uncle ith cancer of the prostate.  Maternal grandmother has heart disease.  No diabetes in the family.  PHYSICAL EXAMINATION:  VITAL SIGNS:  Weight 189.  Blood pressure 100/60.  HEENT:  Examination of the ears, nose, and throat is unremarkable.  Oropharynx s not injected.  NECK:  Supple.  Thyroid is not enlarged.  Carotid pulses are equal, without bruits. No adenopathy appreciated.  LUNGS:  Clear to P&A.  BREASTS:  No masses or tenderness.  HEART:   Normal sinus rhythm.  No murmurs.  No heaves, thrills, rubs, or gallops.  ABDOMEN:  Soft, nontender.  Bowel sounds are normal.  No bruits are heard.  EXTREMITIES:  Good range of motion.  Equal pulses and reflexes.  There is no edema.  PELVIC:  Normal vulva and vagina.  Cervix is without lesion.  Uterus is anterior, about twice normal size symmetrically, with no adnexal masses.  Rectovaginal confirms.  IMPRESSION:  Persistent dysmenorrhea and menorrhagia despite oral contractive therapy.  PLAN:  LAVH, positive TAH.  Risks and benefits have been discussed with the patient.  She has been given detailed informed consent. DD:  07/31/99 TD:  07/31/99 Job: [id number]

## 2010-08-18 NOTE — Discharge Summary (Signed)
[facility]  Patient:    Barbara Trevino                   MRN: [id number] Adm. Date:  [id number] Disc. Date: [id number] Attending:  Lendon Colonel                           Discharge Summary  ADMISSION DIAGNOSES: 1. Menorrhagia and dysmenorrhea, unresponsive. 2. Uterine adenomyosis.  DISCHARGE DIAGNOSES: 1. Menorrhagia and dysmenorrhea, unresponsive. 2. Uterine adenomyosis.  OPERATION:  Laparoscopically-assisted vaginal hysterectomy.  HISTORY OF PRESENT ILLNESS:  Barbara Trevino was a 50 year old female has three living children and two pregnancies.  She was admitted for a laparoscopically-assisted vaginal hysterectomy for persistent heavy painful periods.  This had not been responsive to conservative measures.  A preoperative assessment was performed in detail.  Informed consent given by the patient.  LABORATORY DATA:  The admission hemoglobin was 10.8 with a mean corpuscular volume of 84.  The urinalysis was unremarkable.  The pathology report revealed a 387 g uterus with adenomyosis.  HOSPITAL COURSE:  The patient was admitted to the hospital and underwent an uneventful laparoscopically-assisted vaginal hysterectomy with laser of endometriosis of pelvic sidewall.  Her postoperative course was uncomplicated and the following day she was discharged home to call for fever, bleeding, or any other problems that she may have.  CONDITION ON DISCHARGE:  Improved. DD:  08/11/99 TD:  08/15/99 Job: [id number]

## 2010-08-18 NOTE — Op Note (Signed)
Barbara Trevino, Barbara Trevino                ACCOUNT NO.:  [id number]   MEDICAL RECORD NO.:  [id number]          PATIENT TYPE:  AMB   LOCATION:  DSC                          FACILITY:  [facility]   PHYSICIAN:  Rose Phi. Maple Hudson, M.D.   DATE OF BIRTH:  09-13-1960   DATE OF PROCEDURE:  07/26/2005  DATE OF DISCHARGE:  07/23/2005                                 OPERATIVE REPORT   PREOPERATIVE DIAGNOSIS:  Stage I carcinoma of the right breast.   POSTOPERATIVE DIAGNOSIS:  Stage I carcinoma of the right breast.   OPERATION:  Insertion of Port-A-Cath under fluoroscopic control.   SURGEON:  Rose Phi. Maple Hudson, M.D.   ANESTHESIA:  MAC.   OPERATIVE PROCEDURE:  The patient was placed on the operating table with  arms down by the side and the left upper chest and neck prepped and draped  in usual fashion.  Under local anesthesia, a left subclavian puncture was  carried out without difficulty on the first pass and the guidewire inserted  and proper position of the wire confirmed on fluoroscopy.  Under local  anesthesia, an incision was made on the anterior chest wall and a pocket  developed for the implantable port.  I tunneled between the subclavian  puncture site and the new port and passed the catheter through that and  connected to the port and placed the port in the pocket.  I then trimmed the  catheter tip measured on the chest wall to the fourth interspace.  We then  passed the dilator and peel-away sheath over the wire and passed then  removed the wire and the dilator and passed the catheter through the peel-  away sheath.  Fluoroscopy then showed that the catheter had actually gone  over into the right subclavian vein.  Under direct fluoroscopic vision, I  then retracted the catheter and then passed it distally and it went down  into the superior vena cava at the level of the cavoatrial junction.  With  proper positioning confirmed by the fluoroscope, we closed incisions with 3-  0 Vicryl and subcuticular  for Monocryl and Steri-Strips.  I then used a  Safety Harbor point needle and accessed the port and it easily aspirated and  irrigated and I fully heparinized and then removed the needle.  Dressings  were then applied and the patient transferred to the recovery room in  satisfactory condition having tolerated procedure well.      Rose Phi. Maple Hudson, M.D.  Electronically Signed     PRY/MEDQ  D:  07/26/2005  T:  07/26/2005  Job:  [id number]

## 2010-10-18 ENCOUNTER — Other Ambulatory Visit (HOSPITAL_COMMUNITY): Payer: Self-pay | Admitting: Orthopedic Surgery

## 2010-10-18 DIAGNOSIS — M79671 Pain in right foot: Secondary | ICD-10-CM

## 2010-10-24 ENCOUNTER — Other Ambulatory Visit (HOSPITAL_COMMUNITY): Payer: 59

## 2010-10-26 ENCOUNTER — Other Ambulatory Visit (HOSPITAL_COMMUNITY): Payer: 59

## 2010-11-06 ENCOUNTER — Ambulatory Visit (HOSPITAL_COMMUNITY)
Admission: RE | Admit: 2010-11-06 | Discharge: 2010-11-06 | Disposition: A | Discharge status: Home / Self Care | Payer: 59 | Source: Ambulatory Visit | Attending: Orthopedic Surgery | Admitting: Orthopedic Surgery

## 2010-11-06 ENCOUNTER — Other Ambulatory Visit (HOSPITAL_COMMUNITY): Payer: Self-pay | Admitting: Orthopedic Surgery

## 2010-11-06 DIAGNOSIS — M898X9 Other specified disorders of bone, unspecified site: Secondary | ICD-10-CM | POA: Insufficient documentation

## 2010-11-06 DIAGNOSIS — X58XXXA Exposure to other specified factors, initial encounter: Secondary | ICD-10-CM | POA: Insufficient documentation

## 2010-11-06 DIAGNOSIS — S93499A Sprain of other ligament of unspecified ankle, initial encounter: Secondary | ICD-10-CM | POA: Insufficient documentation

## 2010-11-06 DIAGNOSIS — M79671 Pain in right foot: Secondary | ICD-10-CM

## 2010-11-06 DIAGNOSIS — M779 Enthesopathy, unspecified: Secondary | ICD-10-CM | POA: Insufficient documentation

## 2010-11-06 DIAGNOSIS — Z853 Personal history of malignant neoplasm of breast: Secondary | ICD-10-CM | POA: Insufficient documentation

## 2010-11-06 DIAGNOSIS — R29898 Other symptoms and signs involving the musculoskeletal system: Secondary | ICD-10-CM | POA: Insufficient documentation

## 2010-11-06 DIAGNOSIS — R209 Unspecified disturbances of skin sensation: Secondary | ICD-10-CM | POA: Insufficient documentation

## 2010-11-06 DIAGNOSIS — R609 Edema, unspecified: Secondary | ICD-10-CM | POA: Insufficient documentation

## 2010-11-06 DIAGNOSIS — Z8551 Personal history of malignant neoplasm of bladder: Secondary | ICD-10-CM | POA: Insufficient documentation

## 2010-11-22 ENCOUNTER — Ambulatory Visit: Payer: 59 | Attending: Orthopedic Surgery | Admitting: Rehabilitation

## 2010-11-22 DIAGNOSIS — M25673 Stiffness of unspecified ankle, not elsewhere classified: Secondary | ICD-10-CM | POA: Insufficient documentation

## 2010-11-22 DIAGNOSIS — R262 Difficulty in walking, not elsewhere classified: Secondary | ICD-10-CM | POA: Insufficient documentation

## 2010-11-22 DIAGNOSIS — IMO0001 Reserved for inherently not codable concepts without codable children: Secondary | ICD-10-CM | POA: Insufficient documentation

## 2010-11-22 DIAGNOSIS — M25676 Stiffness of unspecified foot, not elsewhere classified: Secondary | ICD-10-CM | POA: Insufficient documentation

## 2010-11-23 ENCOUNTER — Ambulatory Visit: Payer: 59 | Admitting: Physical Therapy

## 2010-11-23 ENCOUNTER — Ambulatory Visit: Payer: 59 | Admitting: Rehabilitation

## 2010-11-27 ENCOUNTER — Encounter: Payer: 59 | Admitting: Physical Therapy

## 2010-11-29 ENCOUNTER — Encounter: Payer: 59 | Admitting: Physical Therapy

## 2010-11-30 ENCOUNTER — Ambulatory Visit: Payer: 59

## 2010-12-05 ENCOUNTER — Encounter: Payer: 59 | Admitting: Physical Therapy

## 2010-12-06 ENCOUNTER — Ambulatory Visit: Payer: 59 | Attending: Orthopedic Surgery | Admitting: Physical Therapy

## 2010-12-06 DIAGNOSIS — M25673 Stiffness of unspecified ankle, not elsewhere classified: Secondary | ICD-10-CM | POA: Insufficient documentation

## 2010-12-06 DIAGNOSIS — IMO0001 Reserved for inherently not codable concepts without codable children: Secondary | ICD-10-CM | POA: Insufficient documentation

## 2010-12-06 DIAGNOSIS — M25676 Stiffness of unspecified foot, not elsewhere classified: Secondary | ICD-10-CM | POA: Insufficient documentation

## 2010-12-06 DIAGNOSIS — R262 Difficulty in walking, not elsewhere classified: Secondary | ICD-10-CM | POA: Insufficient documentation

## 2010-12-07 ENCOUNTER — Encounter: Payer: 59 | Admitting: Physical Therapy

## 2010-12-13 ENCOUNTER — Ambulatory Visit: Payer: 59 | Admitting: Physical Therapy

## 2010-12-26 LAB — CBC
HCT: 33.1 — ABNORMAL LOW
Hemoglobin: 11.9 — ABNORMAL LOW
MCHC: 35.8
Platelets: 179
RDW: 13.5

## 2010-12-26 LAB — PROTIME-INR: INR: 1

## 2011-01-04 ENCOUNTER — Other Ambulatory Visit: Payer: Self-pay | Admitting: Oncology

## 2011-01-04 ENCOUNTER — Encounter (HOSPITAL_BASED_OUTPATIENT_CLINIC_OR_DEPARTMENT_OTHER): Payer: 59 | Admitting: Oncology

## 2011-01-04 DIAGNOSIS — C50419 Malignant neoplasm of upper-outer quadrant of unspecified female breast: Secondary | ICD-10-CM

## 2011-01-04 DIAGNOSIS — Z853 Personal history of malignant neoplasm of breast: Secondary | ICD-10-CM

## 2011-01-04 LAB — CBC WITH DIFFERENTIAL/PLATELET
BASO%: 0.5 % (ref 0.0–2.0)
EOS%: 1.4 % (ref 0.0–7.0)
HCT: 35.5 % (ref 34.8–46.6)
LYMPH%: 34.6 % (ref 14.0–49.7)
MCH: 30.5 pg (ref 25.1–34.0)
MCHC: 34.6 g/dL (ref 31.5–36.0)
MONO%: 6 % (ref 0.0–14.0)
NEUT%: 57.5 % (ref 38.4–76.8)
Platelets: 206 10*3/uL (ref 145–400)
RBC: 4.03 10*6/uL (ref 3.70–5.45)
WBC: 3.9 10*3/uL (ref 3.9–10.3)

## 2011-01-05 LAB — COMPREHENSIVE METABOLIC PANEL
ALT: 29 U/L (ref 0–35)
AST: 31 U/L (ref 0–37)
Alkaline Phosphatase: 91 U/L (ref 39–117)
Creatinine, Ser: 1.08 mg/dL (ref 0.50–1.10)
Sodium: 138 mEq/L (ref 135–145)
Total Bilirubin: 0.3 mg/dL (ref 0.3–1.2)
Total Protein: 7.1 g/dL (ref 6.0–8.3)

## 2011-01-05 LAB — LACTATE DEHYDROGENASE: LDH: 188 U/L (ref 94–250)

## 2011-01-09 LAB — POCT RAPID STREP A: Streptococcus, Group A Screen (Direct): NEGATIVE

## 2011-03-01 NOTE — Progress Notes (Signed)
CC:   Bryan Lemma. Manus Gunning, M.D. Billie Lade, Ph.D., M.D. Zenovia Jordan, MD  PROBLEMS: 1. Stage I breast cancer, status post chemotherapy on 11/16/2005,     status post radiation therapy completed on 01/28/2006, on Tamoxifen     from March 2009, status post salpingo-oophorectomy currently on     Femara. 2. Low-grade pathway urothelial carcinoma.  INTERVAL HISTORY:  Barbara Trevino returns for followup overall doing pretty well. Her bladder cancer issue has been under good control as well.  She gets careful followup for that as well.  From a breast cancer point of view, she is doing well.  Of note is that she was seen by Urology in June.  At that time, she had a cystoscopy which was normal.  Her last mammogram was in May 2012 and that was normal as well.  In addition, she had a bone density test which showed normal bone density.  MEDICATION LIST:  Her medications were reviewed today.  She continues on Nexium, vitamin D3, Detrol, Effexor, calcium with vitamin D, Meloxicam, Crestor.  PERFORMANCE STATUS:  ECOG status is zero.  PHYSICAL EXAMINATION:  General Appearance:  A pleasant, alert woman looking stated age.  Vital Signs:  Blood pressure is 137/86. Temperature 98.5.  Pulse 99.  Respiratory rate 18.  Weight is 250.  Head and Neck Exam:  No palpable adenopathy in the head and neck area. Lungs:  Clear.  Heart:  Sounds are normal.  Breast Exam:  She has no evidence of local recurrence.  Both breasts are free of any obvious masses.  There is no nipple retraction or skin changes.  Axillae are negative.  Abdomen:  Soft.  No palpable hepatosplenomegaly.  No inguinal adenopathy.  Extremities:  No peripheral cyanosis, clubbing, or edema.  IMPRESSION AND PLAN:  Barbara Trevino is doing well.  No clinical evidence of recurrence.  I will see her in a year's time with appropriate imaging studies.  Lab studies were reviewed today.  They are within normal limits with a normal tumor marker and a vitamin D level,  which is excellent.    ______________________________ Pierce Crane, M.D., F.R.C.P.C. PR/MEDQ  D:  03/01/2011  T:  03/01/2011  Job:  [id number]

## 2011-03-05 ENCOUNTER — Telehealth: Payer: Self-pay | Admitting: *Deleted

## 2011-03-05 NOTE — Telephone Encounter (Signed)
patient confirmed over the phone the new date and time on 01-04-2012 starting at 8:30 with labs

## 2011-03-14 ENCOUNTER — Other Ambulatory Visit: Payer: Self-pay | Admitting: *Deleted

## 2011-03-14 DIAGNOSIS — C50419 Malignant neoplasm of upper-outer quadrant of unspecified female breast: Secondary | ICD-10-CM

## 2011-03-14 MED ORDER — LETROZOLE 2.5 MG PO TABS: 2.5000 mg | ORAL_TABLET | Freq: Every day | ORAL | Status: AC

## 2011-03-15 ENCOUNTER — Other Ambulatory Visit: Payer: Self-pay | Admitting: *Deleted

## 2011-03-15 DIAGNOSIS — C50919 Malignant neoplasm of unspecified site of unspecified female breast: Secondary | ICD-10-CM

## 2011-03-15 MED ORDER — ACYCLOVIR 400 MG PO TABS: 400.0000 mg | ORAL_TABLET | Freq: Two times a day (BID) | ORAL | Status: AC

## 2011-03-15 MED ORDER — ZOLPIDEM TARTRATE 5 MG PO TABS: 5.0000 mg | ORAL_TABLET | Freq: Every evening | ORAL | Status: DC | PRN

## 2011-04-04 ENCOUNTER — Other Ambulatory Visit: Payer: Self-pay | Admitting: *Deleted

## 2011-04-04 DIAGNOSIS — C50419 Malignant neoplasm of upper-outer quadrant of unspecified female breast: Secondary | ICD-10-CM

## 2011-04-04 MED ORDER — GABAPENTIN 300 MG PO CAPS: ORAL_CAPSULE | ORAL | Status: DC

## 2011-04-04 MED ORDER — VENLAFAXINE HCL ER 150 MG PO CP24: 150.0000 mg | ORAL_CAPSULE | Freq: Every day | ORAL | Status: DC

## 2011-04-04 NOTE — Telephone Encounter (Addendum)
She is due to refill her Effexor XR 150mg  and her hot flashes have come back with a vengence. She is on Femara.  She has been on this for 2-3 years and she wonders if she has built up a tolerance to it or become immune to it.  If she needs to wait it out and see if it gets better she will.  But she did want to check before she gets it refilled. Discussed with Debbora Presto PA.   Pt. Can add neurontin 100mg  at HS to effexor and titrate up to #3 (three hundred mg total).  If this is helpful and the hot flashes are the only reason that she is taking the effexor...then we may wean her off the effexor.  She is currently on Effexor ER 150mg .   Called [facility] and they have just refilled the effexor from a script from October from "Lorene Dy (this probably might have been Dr. Donnie Coffin as their names are next to each other on the script pad. And pt. Saw Dr. Donnie Coffin in October '12) Discussed with pt. And she did not really want to add another medication, however, whatever we give her will probably be in addition to the effexor as she would not want to discontinue this suddenly.  She verbalized understanding.

## 2011-04-27 ENCOUNTER — Other Ambulatory Visit: Payer: Self-pay | Admitting: *Deleted

## 2011-04-27 DIAGNOSIS — C50419 Malignant neoplasm of upper-outer quadrant of unspecified female breast: Secondary | ICD-10-CM

## 2011-04-27 MED ORDER — VENLAFAXINE HCL ER 150 MG PO CP24: 150.0000 mg | ORAL_CAPSULE | Freq: Every day | ORAL | Status: DC

## 2011-04-27 NOTE — Telephone Encounter (Signed)
Pharmacy faxed request for 90-day supply per pt. Request.

## 2011-07-12 ENCOUNTER — Other Ambulatory Visit: Payer: Self-pay | Admitting: Oncology

## 2011-07-12 NOTE — Telephone Encounter (Signed)
HYDROCODONE-ACETAMINOPHEN 5/500 WAS NOT REFILLED ON 07/11/11 BY THIS NURSE.

## 2011-07-12 NOTE — Telephone Encounter (Signed)
MEDICATION WAS NOT REFILLED ON 07/12/11 INSTEAD OF 07/11/11 BY THIS NURSE.

## 2011-07-12 NOTE — Telephone Encounter (Signed)
PT. WANTS TO HAVE MEDICATION ON HAND WHEN HER LEGS HURT AFTER WORKING. ALSO PT. IS HAVING A BURNING PAIN IN HER LEFT BREAST. SHE DOES NOT FEEL ANY LUMPS AND THERE IS NO DISCHARGE. PT. HAS MOVED HER MAMMOGRAM FROM 08/10/11 TO 07/18/11. DR.RUBIN WILL NEED TO SEND AN ORDER TO THE BRE[facility]. THIS NOTE TO DR.RUBIN'S NURSE, VICTORIA MOORE,RN.

## 2011-07-13 ENCOUNTER — Other Ambulatory Visit: Payer: Self-pay | Admitting: Oncology

## 2011-07-13 DIAGNOSIS — C50919 Malignant neoplasm of unspecified site of unspecified female breast: Secondary | ICD-10-CM

## 2011-07-18 ENCOUNTER — Other Ambulatory Visit: Payer: Self-pay | Admitting: Oncology

## 2011-07-18 ENCOUNTER — Ambulatory Visit
Admission: RE | Admit: 2011-07-18 | Discharge: 2011-07-18 | Disposition: A | Discharge status: Home / Self Care | Payer: 59 | Source: Ambulatory Visit | Attending: Oncology | Admitting: Oncology

## 2011-07-18 DIAGNOSIS — Z853 Personal history of malignant neoplasm of breast: Secondary | ICD-10-CM

## 2011-08-13 ENCOUNTER — Other Ambulatory Visit: Payer: 59

## 2011-08-13 ENCOUNTER — Ambulatory Visit
Admission: RE | Admit: 2011-08-13 | Discharge: 2011-08-13 | Disposition: A | Discharge status: Home / Self Care | Payer: 59 | Source: Ambulatory Visit | Attending: Oncology | Admitting: Oncology

## 2011-08-13 DIAGNOSIS — C50919 Malignant neoplasm of unspecified site of unspecified female breast: Secondary | ICD-10-CM

## 2011-10-22 ENCOUNTER — Other Ambulatory Visit: Payer: Self-pay | Admitting: Physician Assistant

## 2011-10-22 DIAGNOSIS — C50419 Malignant neoplasm of upper-outer quadrant of unspecified female breast: Secondary | ICD-10-CM

## 2011-10-22 DIAGNOSIS — R232 Flushing: Secondary | ICD-10-CM

## 2011-11-12 ENCOUNTER — Other Ambulatory Visit: Payer: Self-pay | Admitting: Physician Assistant

## 2011-11-12 DIAGNOSIS — C50419 Malignant neoplasm of upper-outer quadrant of unspecified female breast: Secondary | ICD-10-CM

## 2011-12-17 ENCOUNTER — Other Ambulatory Visit: Payer: Self-pay | Admitting: Physician Assistant

## 2011-12-25 ENCOUNTER — Telehealth: Payer: Self-pay | Admitting: *Deleted

## 2011-12-25 NOTE — Telephone Encounter (Signed)
per md reschedule patient for 01-08-2012 starting at 8:30am left voice message to inform the patient of the new date and time

## 2012-01-04 ENCOUNTER — Ambulatory Visit: Payer: 59 | Admitting: Oncology

## 2012-01-04 ENCOUNTER — Other Ambulatory Visit: Payer: 59 | Admitting: Lab

## 2012-01-08 ENCOUNTER — Encounter: Payer: Self-pay | Admitting: *Deleted

## 2012-01-08 ENCOUNTER — Ambulatory Visit: Payer: 59 | Admitting: Oncology

## 2012-01-08 ENCOUNTER — Other Ambulatory Visit: Payer: 59 | Admitting: Lab

## 2012-01-08 NOTE — Progress Notes (Unsigned)
Pt scheduled for annual exam 01/08/12. Pt states that she cancelled appt. This nurse attempted to r/s--however pt wants to check her schedule. Scheduling  Number was provided

## 2012-02-04 ENCOUNTER — Other Ambulatory Visit: Payer: Self-pay | Admitting: Oncology

## 2012-03-04 ENCOUNTER — Encounter (HOSPITAL_BASED_OUTPATIENT_CLINIC_OR_DEPARTMENT_OTHER): Payer: Self-pay | Admitting: *Deleted

## 2012-03-06 ENCOUNTER — Ambulatory Visit (HOSPITAL_BASED_OUTPATIENT_CLINIC_OR_DEPARTMENT_OTHER)
Admission: RE | Admit: 2012-03-06 | Discharge: 2012-03-06 | Disposition: A | Discharge status: Home / Self Care | Payer: 59 | Source: Ambulatory Visit | Attending: Orthopedic Surgery | Admitting: Orthopedic Surgery

## 2012-03-06 ENCOUNTER — Encounter (HOSPITAL_BASED_OUTPATIENT_CLINIC_OR_DEPARTMENT_OTHER): Payer: Self-pay | Admitting: *Deleted

## 2012-03-06 ENCOUNTER — Encounter (HOSPITAL_BASED_OUTPATIENT_CLINIC_OR_DEPARTMENT_OTHER)
Admission: RE | Disposition: A | Discharge status: Home / Self Care | Payer: Self-pay | Source: Ambulatory Visit | Attending: Orthopedic Surgery

## 2012-03-06 ENCOUNTER — Ambulatory Visit (HOSPITAL_BASED_OUTPATIENT_CLINIC_OR_DEPARTMENT_OTHER): Payer: 59 | Admitting: *Deleted

## 2012-03-06 DIAGNOSIS — M6700 Short Achilles tendon (acquired), unspecified ankle: Secondary | ICD-10-CM

## 2012-03-06 DIAGNOSIS — M6240 Contracture of muscle, unspecified site: Secondary | ICD-10-CM | POA: Insufficient documentation

## 2012-03-06 DIAGNOSIS — M76829 Posterior tibial tendinitis, unspecified leg: Secondary | ICD-10-CM | POA: Insufficient documentation

## 2012-03-06 HISTORY — DX: Polyneuropathy, unspecified: G62.9

## 2012-03-06 HISTORY — PX: CALCANEAL OSTEOTOMY: SHX1281

## 2012-03-06 HISTORY — PX: GASTROCNEMIUS RECESSION: SHX863

## 2012-03-06 HISTORY — DX: Malignant (primary) neoplasm, unspecified: C80.1

## 2012-03-06 SURGERY — OSTEOTOMY, CALCANEUS
Anesthesia: Regional | Site: Leg Lower | Laterality: Right | Wound class: Clean

## 2012-03-06 MED ORDER — OXYCODONE HCL 5 MG PO TABS: 5.0000 mg | ORAL_TABLET | Freq: Once | ORAL | Status: DC | PRN

## 2012-03-06 MED ORDER — BUPIVACAINE HCL (PF) 0.5 % IJ SOLN
INTRAMUSCULAR | Status: DC | PRN
  Administered 2012-03-06: 15 mL

## 2012-03-06 MED ORDER — HYDROMORPHONE HCL PF 1 MG/ML IJ SOLN: 0.2500 mg | INTRAMUSCULAR | Status: DC | PRN

## 2012-03-06 MED ORDER — CEFAZOLIN SODIUM-DEXTROSE 2-3 GM-% IV SOLR
2.0000 g | INTRAVENOUS | Status: AC
  Administered 2012-03-06: 2 g via INTRAVENOUS

## 2012-03-06 MED ORDER — DEXAMETHASONE SODIUM PHOSPHATE 10 MG/ML IJ SOLN
INTRAMUSCULAR | Status: DC | PRN
  Administered 2012-03-06: 10 mg via INTRAVENOUS

## 2012-03-06 MED ORDER — LIDOCAINE HCL (CARDIAC) 20 MG/ML IV SOLN
INTRAVENOUS | Status: DC | PRN
  Administered 2012-03-06: 30 mg via INTRAVENOUS

## 2012-03-06 MED ORDER — LACTATED RINGERS IV SOLN
INTRAVENOUS | Status: DC
  Administered 2012-03-06 (×3): via INTRAVENOUS

## 2012-03-06 MED ORDER — OXYCODONE HCL 5 MG PO TABS: 5.0000 mg | ORAL_TABLET | ORAL | Status: DC | PRN

## 2012-03-06 MED ORDER — SODIUM CHLORIDE 0.9 % IV SOLN: INTRAVENOUS | Status: DC

## 2012-03-06 MED ORDER — PROPOFOL 10 MG/ML IV BOLUS
INTRAVENOUS | Status: DC | PRN
  Administered 2012-03-06: 200 mg via INTRAVENOUS

## 2012-03-06 MED ORDER — BUPIVACAINE-EPINEPHRINE PF 0.5-1:200000 % IJ SOLN
INTRAMUSCULAR | Status: DC | PRN
  Administered 2012-03-06: 30 mL

## 2012-03-06 MED ORDER — 0.9 % SODIUM CHLORIDE (POUR BTL) OPTIME
TOPICAL | Status: DC | PRN
  Administered 2012-03-06: 300 mL

## 2012-03-06 MED ORDER — FENTANYL CITRATE 0.05 MG/ML IJ SOLN
50.0000 ug | INTRAMUSCULAR | Status: DC | PRN
  Administered 2012-03-06: 100 ug via INTRAVENOUS

## 2012-03-06 MED ORDER — CHLORHEXIDINE GLUCONATE 4 % EX LIQD: 60.0000 mL | Freq: Once | CUTANEOUS | Status: DC

## 2012-03-06 MED ORDER — OXYCODONE HCL 5 MG/5ML PO SOLN: 5.0000 mg | Freq: Once | ORAL | Status: DC | PRN

## 2012-03-06 MED ORDER — ACETAMINOPHEN 10 MG/ML IV SOLN
1000.0000 mg | Freq: Once | INTRAVENOUS | Status: AC
  Administered 2012-03-06: 1000 mg via INTRAVENOUS

## 2012-03-06 MED ORDER — MIDAZOLAM HCL 2 MG/2ML IJ SOLN
0.5000 mg | INTRAMUSCULAR | Status: DC | PRN
  Administered 2012-03-06: 2 mg via INTRAVENOUS

## 2012-03-06 MED ORDER — PROMETHAZINE HCL 25 MG/ML IJ SOLN
6.2500 mg | Freq: Four times a day (QID) | INTRAMUSCULAR | Status: DC | PRN
  Administered 2012-03-06: 6.25 mg via INTRAVENOUS

## 2012-03-06 MED ORDER — ONDANSETRON HCL 4 MG/2ML IJ SOLN
INTRAMUSCULAR | Status: DC | PRN
  Administered 2012-03-06: 4 mg via INTRAVENOUS

## 2012-03-06 MED ORDER — BACITRACIN ZINC 500 UNIT/GM EX OINT
TOPICAL_OINTMENT | CUTANEOUS | Status: DC | PRN
  Administered 2012-03-06: 1 via TOPICAL

## 2012-03-06 SURGICAL SUPPLY — 83 items
BANDAGE ESMARK 6X9 LF (GAUZE/BANDAGES/DRESSINGS) ×2 IMPLANT
BLADE AVERAGE 25X9 (BLADE) IMPLANT
BLADE CCA MICRO SAG (BLADE) IMPLANT
BLADE MICRO SAGITTAL (BLADE) ×3 IMPLANT
BLADE SURG 11 STRL SS (BLADE) IMPLANT
BLADE SURG 15 STRL LF DISP TIS (BLADE) ×6 IMPLANT
BLADE SURG 15 STRL SS (BLADE) ×3
BNDG COHESIVE 4X5 TAN STRL (GAUZE/BANDAGES/DRESSINGS) ×3 IMPLANT
BNDG COHESIVE 6X5 TAN STRL LF (GAUZE/BANDAGES/DRESSINGS) ×3 IMPLANT
BNDG ESMARK 6X9 LF (GAUZE/BANDAGES/DRESSINGS) ×3
BUR EGG 3PK/BX (BURR) IMPLANT
CANISTER SUCTION 1200CC (MISCELLANEOUS) IMPLANT
CHLORAPREP W/TINT 26ML (MISCELLANEOUS) ×6 IMPLANT
CLOTH BEACON ORANGE TIMEOUT ST (SAFETY) ×3 IMPLANT
COVER TABLE BACK 60X90 (DRAPES) ×6 IMPLANT
CUFF TOURNIQUET SINGLE 34IN LL (TOURNIQUET CUFF) ×3 IMPLANT
DRAPE C-ARM 42X72 X-RAY (DRAPES) IMPLANT
DRAPE C-ARMOR (DRAPES) IMPLANT
DRAPE EXTREMITY T 121X128X90 (DRAPE) ×3 IMPLANT
DRAPE INCISE IOBAN 66X45 STRL (DRAPES) IMPLANT
DRAPE OEC MINIVIEW 54X84 (DRAPES) ×3 IMPLANT
DRAPE PED LAPAROTOMY (DRAPES) IMPLANT
DRAPE U 20/CS (DRAPES) IMPLANT
DRAPE U-SHAPE 47X51 STRL (DRAPES) ×3 IMPLANT
DRSG EMULSION OIL 3X3 NADH (GAUZE/BANDAGES/DRESSINGS) ×6 IMPLANT
DRSG PAD ABDOMINAL 8X10 ST (GAUZE/BANDAGES/DRESSINGS) ×6 IMPLANT
ELECT REM PT RETURN 9FT ADLT (ELECTROSURGICAL) ×3
ELECTRODE REM PT RTRN 9FT ADLT (ELECTROSURGICAL) ×2 IMPLANT
GAUZE SPONGE 4X4 16PLY XRAY LF (GAUZE/BANDAGES/DRESSINGS) IMPLANT
GLOVE BIO SURGEON STRL SZ8 (GLOVE) ×3 IMPLANT
GLOVE BIOGEL PI IND STRL 8 (GLOVE) ×2 IMPLANT
GLOVE BIOGEL PI INDICATOR 8 (GLOVE) ×1
GLOVE ECLIPSE 6.5 STRL STRAW (GLOVE) ×6 IMPLANT
GLOVE INDICATOR 6.5 STRL GRN (GLOVE) ×3 IMPLANT
GOWN PREVENTION PLUS XLARGE (GOWN DISPOSABLE) ×3 IMPLANT
GOWN PREVENTION PLUS XXLARGE (GOWN DISPOSABLE) ×3 IMPLANT
GUIDEWIRE 2.4 HINDFOOT (WIRE) ×2
GUIDEWIRE W/TROCAR TIP .094X8 (WIRE) ×3 IMPLANT
KIT BIO-TENODESIS 3X8 DISP (MISCELLANEOUS) ×1
KIT INSRT BABSR STRL DISP BTN (MISCELLANEOUS) ×2 IMPLANT
NDL SUT 6 .5 CRC .975X.05 MAYO (NEEDLE) ×2 IMPLANT
NEEDLE HYPO 22GX1.5 SAFETY (NEEDLE) IMPLANT
NEEDLE MAYO TAPER (NEEDLE) ×1
NS IRRIG 1000ML POUR BTL (IV SOLUTION) ×3 IMPLANT
PACK BASIN DAY SURGERY FS (CUSTOM PROCEDURE TRAY) ×3 IMPLANT
PAD CAST 4YDX4 CTTN HI CHSV (CAST SUPPLIES) ×4 IMPLANT
PADDING CAST COTTON 4X4 STRL (CAST SUPPLIES) ×2
PADDING CAST COTTON 6X4 STRL (CAST SUPPLIES) ×3 IMPLANT
PENCIL BUTTON HOLSTER BLD 10FT (ELECTRODE) ×3 IMPLANT
SCREW BIO TENODESIS 5.5 (Screw) ×3 IMPLANT
SCREW CANN 6.7X55 18 THD SD (Screw) ×3 IMPLANT
SHEET MEDIUM DRAPE 40X70 STRL (DRAPES) ×3 IMPLANT
SLEEVE SCD COMPRESS KNEE MED (MISCELLANEOUS) ×3 IMPLANT
SPLINT FAST PLASTER 5X30 (CAST SUPPLIES) ×20
SPLINT PLASTER CAST FAST 5X30 (CAST SUPPLIES) ×40 IMPLANT
SPONGE GAUZE 4X4 12PLY (GAUZE/BANDAGES/DRESSINGS) ×3 IMPLANT
SPONGE LAP 18X18 X RAY DECT (DISPOSABLE) ×3 IMPLANT
STOCKINETTE 6  STRL (DRAPES) ×1
STOCKINETTE 6 STRL (DRAPES) ×2 IMPLANT
STRIP CLOSURE SKIN 1/2X4 (GAUZE/BANDAGES/DRESSINGS) IMPLANT
SUCTION FRAZIER TIP 10 FR DISP (SUCTIONS) ×3 IMPLANT
SUT 2 FIBERLOOP 20 STRT BLUE (SUTURE)
SUT BONE WAX W31G (SUTURE) IMPLANT
SUT ETHIBOND 2 OS 4 DA (SUTURE) IMPLANT
SUT ETHILON 3 0 PS 1 (SUTURE) ×6 IMPLANT
SUT FIBERWIRE #2 38 T-5 BLUE (SUTURE)
SUT FIBERWIRE 2-0 18 17.9 3/8 (SUTURE)
SUT MNCRL AB 3-0 PS2 18 (SUTURE) ×6 IMPLANT
SUT VIC AB 0 SH 27 (SUTURE) ×3 IMPLANT
SUT VIC AB 2-0 PS2 27 (SUTURE) IMPLANT
SUT VIC AB 2-0 SH 27 (SUTURE)
SUT VIC AB 2-0 SH 27XBRD (SUTURE) IMPLANT
SUT VIC AB 3-0 PS1 18 (SUTURE)
SUT VIC AB 3-0 PS1 18XBRD (SUTURE) IMPLANT
SUTURE 2 FIBERLOOP 20 STRT BLU (SUTURE) IMPLANT
SUTURE FIBERWR #2 38 T-5 BLUE (SUTURE) IMPLANT
SUTURE FIBERWR 2-0 18 17.9 3/8 (SUTURE) IMPLANT
SYR BULB 3OZ (MISCELLANEOUS) ×3 IMPLANT
SYR CONTROL 10ML LL (SYRINGE) IMPLANT
TOWEL OR 17X24 6PK STRL BLUE (TOWEL DISPOSABLE) ×6 IMPLANT
TUBE CONNECTING 20X1/4 (TUBING) ×3 IMPLANT
UNDERPAD 30X30 INCONTINENT (UNDERPADS AND DIAPERS) ×3 IMPLANT
YANKAUER SUCT BULB TIP NO VENT (SUCTIONS) IMPLANT

## 2012-03-06 NOTE — Anesthesia Preprocedure Evaluation (Addendum)
Anesthesia Evaluation  Patient identified by MRN, date of birth, ID band Patient awake    Reviewed: Allergy & Precautions, H&P , NPO status , Patient's Chart, lab work & pertinent test results  Airway Mallampati: II TM Distance: >3 FB Neck ROM: Full    Dental No notable dental hx. (+) Teeth Intact and Dental Advisory Given   Pulmonary neg pulmonary ROS,  breath sounds clear to auscultation  Pulmonary exam normal       Cardiovascular negative cardio ROS  Rhythm:Regular Rate:Normal     Neuro/Psych negative neurological ROS  negative psych ROS   GI/Hepatic Neg liver ROS, GERD-  Medicated and Controlled,  Endo/Other  negative endocrine ROS  Renal/GU negative Renal ROS  negative genitourinary   Musculoskeletal   Abdominal   Peds  Hematology negative hematology ROS (+)   Anesthesia Other Findings   Reproductive/Obstetrics negative OB ROS                           Anesthesia Physical Anesthesia Plan  ASA: II  Anesthesia Plan: General and Regional   Post-op Pain Management:    Induction: Intravenous  Airway Management Planned: LMA  Additional Equipment:   Intra-op Plan:   Post-operative Plan: Extubation in OR  Informed Consent: I have reviewed the patients History and Physical, chart, labs and discussed the procedure including the risks, benefits and alternatives for the proposed anesthesia with the patient or authorized representative who has indicated his/her understanding and acceptance.   Dental advisory given  Plan Discussed with: CRNA  Anesthesia Plan Comments:         Anesthesia Quick Evaluation  

## 2012-03-06 NOTE — Progress Notes (Signed)
Assisted Dr. Fitzgerald with right, ultrasound guided, popliteal/saphenous block. Side rails up, monitors on throughout procedure. See vital signs in flow sheet. Tolerated Procedure well. 

## 2012-03-06 NOTE — Transfer of Care (Signed)
Immediate Anesthesia Transfer of Care Note  Patient: Barbara Trevino  Procedure(s) Performed: Procedure(s) (LRB) with comments: CALCANEAL OSTEOTOMY (Right) - Right Gastroc Slide; Partial Tibia Tenolysis; Flexor Digitorum Longus Transfer to Navicular; Calcaneal Osteotomy GASTROCNEMIUS SLIDE (Right) - Right Gastroc Slide; Partial Tibia Tenolysis; Flexor Digitorum Longus Transfer to Navicular; Calcaneal Osteotomy   FLEXOR DIGITORUM LONGUS TENDON TO NAVICULAR TRANSFER (Right) - Right Gastroc Slide; Partial Tibia Tenolysis; Flexor Digitorum Longus Transfer to Navicular; Calcaneal Osteotomy    Patient Location: PACU  Anesthesia Type:GA combined with regional for post-op pain  Level of Consciousness: awake, alert  and oriented  Airway & Oxygen Therapy: Patient Spontanous Breathing and Patient connected to face mask oxygen  Post-op Assessment: Report given to PACU RN, Post -op Vital signs reviewed and stable and Patient moving all extremities  Post vital signs: Reviewed and stable  Complications: No apparent anesthesia complications

## 2012-03-06 NOTE — H&P (Signed)
Barbara Trevino is an 51 y.o. female.   Chief Complaint: right ankle pain HPI: 51 y/o female with painful right ankle and foot.  She has posterior tibial tendonitis and a tight gastroc.  She presents now for surgical treatment.  Past Medical History  Diagnosis Date  . Cancer 2007    hx rt breast cancer-bladder cancer  . Neuropathy     Past Surgical History  Procedure Date  . Cystoscopy with biopsy   . Abdominal hysterectomy 2001    vag hyst  . Breast surgery 2007    rt lumpectomy-snbx  . Port-a-cath removal 2007    insertion then taken out  . Carpal tunnel release 2007    left  . Bladder suspension 2009    cysto bladder cancer removed  . Breast reduction surgery 2004    No family history on file. Social History:  reports that she has never smoked. She does not have any smokeless tobacco history on file. She reports that she does not drink alcohol or use illicit drugs.  Allergies: No Known Allergies  Medications Prior to Admission  Medication Sig Dispense Refill  . esomeprazole (NEXIUM) 40 MG capsule Take 40 mg by mouth every evening.      . gabapentin (NEURONTIN) 100 MG capsule TAKE 1 CAPSULE BY MOUTH EVERY NIGHT AT BEDTIME, MAY TITRATE UP TO 3 CAPSULES AT BEDTIME  90 capsule  0  . venlafaxine XR (EFFEXOR-XR) 150 MG 24 hr capsule TAKE 1 CAPSULE BY MOUTH DAILY.  90 capsule  2  . zolpidem (AMBIEN) 5 MG tablet TAKE 1 TABLET BY MOUTH AT BEDTIME AS NEEDED  90 tablet  0    No results found for this or any previous visit (from the past 48 hour(s)). No results found.  ROS  No recent f/c/n/v/wt loss  Height 5\' 10"  (1.778 m), weight 108.863 kg (240 lb). Physical Exam wn wd woman in nad.  A and O x 4.  Mood and affect normal.  EOMI.  Respirations unlabored.  R gastroc is tight.  Skin healthy and intact.  2+ dp and pt pulses.  Feels LT normally.  5/5 strength in PF, DF, inversion and eversion.  Gait is antalgic to the right.  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  The risks and benefits of the alternative treatment options have been discussed in detail.  The patient wishes to proceed with surgery and specifically understands risks of bleeding, infection, nerve damage, blood clots, need for additional surgery, amputation and death.   Toni Arthurs March 30, 2012, 9:31 AM

## 2012-03-06 NOTE — Anesthesia Postprocedure Evaluation (Signed)
  Anesthesia Post-op Note  Patient: Barbara Trevino  Procedure(s) Performed: Procedure(s) (LRB) with comments: CALCANEAL OSTEOTOMY (Right) - Right Gastroc Slide; Partial Tibia Tenolysis; Flexor Digitorum Longus Transfer to Navicular; Calcaneal Osteotomy GASTROCNEMIUS SLIDE (Right) - Right Gastroc Slide; Partial Tibia Tenolysis; Flexor Digitorum Longus Transfer to Navicular; Calcaneal Osteotomy   FLEXOR DIGITORUM LONGUS TENDON TO NAVICULAR TRANSFER (Right) - Right Gastroc Slide; Partial Tibia Tenolysis; Flexor Digitorum Longus Transfer to Navicular; Calcaneal Osteotomy    Patient Location: PACU  Anesthesia Type:GA combined with regional for post-op pain  Level of Consciousness: awake and alert   Airway and Oxygen Therapy: Patient Spontanous Breathing and Patient connected to face mask oxygen  Post-op Pain: none  Post-op Assessment: Post-op Vital signs reviewed, Patient's Cardiovascular Status Stable, Respiratory Function Stable, Patent Airway and No signs of Nausea or vomiting  Post-op Vital Signs: Reviewed and stable  Complications: No apparent anesthesia complications

## 2012-03-06 NOTE — Brief Op Note (Signed)
03/06/2012  12:30 PM  PATIENT:  [name]  51 y.o. 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  Right gastrocnemius recession 2.  Right medializing calcaneal osteotomy 3.  Transfer of FDL tendon to the navicular 4.  Fluoro  SURGEON:  Toni Arthurs, MD  ASSISTANT: n/a  ANESTHESIA:   General, regional  EBL:  minimal   TOURNIQUET:   Total Tourniquet Time Documented: Thigh (Right) - 72 minutes  COMPLICATIONS:  None apparent  DISPOSITION:  Extubated, awake and stable to recovery.  DICTATION ID:  [id number]

## 2012-03-06 NOTE — Anesthesia Procedure Notes (Signed)
Anesthesia Regional Block:  Popliteal block  Pre-Anesthetic Checklist: ,, timeout performed, Correct Patient, Correct Site, Correct Laterality, Correct Procedure, Correct Position, site marked, Risks and benefits discussed, pre-op evaluation, post-op pain management  Laterality: Right  Prep: Maximum Sterile Barrier Precautions used and chloraprep       Needles:  Injection technique: Single-shot  Needle Type: Echogenic Stimulator Needle     Needle Length:cm 9 cm Needle Gauge: 21 G    Additional Needles:  Procedures: ultrasound guided (picture in chart) and nerve stimulator Popliteal block  Nerve Stimulator or Paresthesia:  Response: Peroneal, 0.4 mA,  Response: Tibial, 0.4 mA,   Additional Responses:   Narrative:  Start time: 03/06/2012 10:10 AM End time: 03/06/2012 10:29 AM Injection made incrementally with aspirations every 5 mL. Anesthesiologist: Sampson Goon, MD  Additional Notes: 2% Lidocaine skin wheel. Saphenous block with 10cc of 0.5% Bupivicaine plain.  Popliteal block

## 2012-03-07 NOTE — Op Note (Signed)
Barbara Trevino, Barbara Trevino                ACCOUNT NO.:  [id number]  MEDICAL RECORD NO.:  [id number]  LOCATION:                                 FACILITY:  PHYSICIAN:  Toni Arthurs, MD             DATE OF BIRTH:  DATE OF PROCEDURE:  03/06/2012 DATE OF DISCHARGE:                              OPERATIVE REPORT   PREOPERATIVE DIAGNOSES: 1. Right posterior tibial tendon dysfunction. 2. Gastrocnemius contracture.  POSTOPERATIVE DIAGNOSES: 1. Right posterior tibial tendon dysfunction. 2. Gastrocnemius contracture.  PROCEDURES: 1. Right gastrocnemius recession. 2. Right medializing calcaneal osteotomy. 3. Transfer of flexor digitorum longus tendon to the navicular. 4. Intraoperative interpretation of fluoroscopic imaging.  SURGEON:  Toni Arthurs, MD  ANESTHESIA:  General, regional.  ESTIMATED BLOOD LOSS:  Minimal.  TOURNIQUET TIME:  72 minutes at 300 mmHg.  COMPLICATIONS:  None apparent.  DISPOSITION:  Extubated, awake, and stable to recovery room.  INDICATIONS FOR PROCEDURE:  The patient is a 51 year old female who has a history of right posterior tibial tendon dysfunction.  She has had a posterior tibial tendon tenolysis in the remote past.  She presents now with gastrocnemius contracture and recurrent right posterior tibial tendinitis symptoms.  She presents now for operative treatment of this condition, having failed treatment with physical therapy, activity modification, oral anti-inflammatories, and bracing.  She understands the risks and benefits, the alternative treatment options, and elects surgical treatment.  She specifically understands risks of bleeding, infection, nerve damage, blood clots, need for additional surgery, amputation, and death.  PROCEDURE IN DETAIL:  After preoperative consent was obtained, the correct operative site was identified.  The patient was brought to the operating room and placed supine on the operating table.  General anesthesia was induced.   Preoperative antibiotics were administered. Surgical time-out was taken.  The right lower extremity was prepped and draped in standard sterile fashion and tourniquet around the thigh.  The extremity was exsanguinated and the tourniquet was inflated to 300 mmHg. A longitudinal incision was made over the medial calf.  Sharp dissection was carried down through the skin and subcutaneous tissue.  The superficial fascia was incised.  The gastrocnemius tendon was identified.  It was isolated from the soleus tendon and the sural nerve was protected as the gastrocnemius tendon was divided from medial to lateral in its entirety.  After the gastroc was divided, the ankle would dorsiflex easily to 30 degrees with the knee extended.  The wound was irrigated copiously.  Inverted simple sutures of 3-0 Monocryl were used to close the subcutaneous tissue and running 3-0 nylon were used to close the skin incision.  Attention was then turned to the lateral aspect of the hindfoot.  An oblique incision was made over the lateral wall of the calcaneus.  Sharp dissection was carried down through the skin and subcutaneous tissue. Periosteum was elevated.  A transverse osteotomy was made across the calcaneus anterior from the tuberosity.  Appropriate position of the osteotomy was confirmed on fluoroscopic imaging prior to making the cut. Once the tuberosity was mobilized, it was translated medially approximately a cm.  A 6.7-mm partially threaded cannulated screw was then inserted  across the osteotomy site in percutaneous fashion.  It was noted to have excellent purchase.  Harris heel and lateral views of the foot showed appropriate compression of the osteotomy and appropriate position and length of the screw.  The overhanging lateral wall was then broken up and impacted with a blunt nose impactor.  The wound was irrigated.  Running 3-0 nylon suture was used to close the skin incision.  Posteriorly, a horizontal  mattress suture was used to close the stab incision.  Attention was then turned to the medial aspect of the ankle.  The previous incision was identified.  It was made again sharply and sharp dissection was carried down through the skin and subcutaneous tissue. The posterior tibial tendon was essentially entirely scar and was not mobile at all.  In fact, it was essentially unidentifiable as a discrete structure.  The spring ligament was noted to be intact.  The navicular was exposed dorsally and plantarly.  The FDL tendon was identified plantar to the navicular and was released proximally, taking care to protect the neurovascular bundle posteriorly.  A malleable retractor was placed in the deep portion of the plantar aspect of the wound.  The FDL tendon was transected.  The cut end was then stitched with a #2 FiberWire.  A guide pin was inserted through the navicular tuberosity from dorsal to plantar.  Appropriate position was confirmed on fluoroscopic imaging.  A 5.5-mm drill bit was then passed over the guidewire, making a hole in the navicular.  The FDL tendon was then passed from plantar to dorsal and secured with a 5.5 mm x 15 mm Ar[employer]rex Bio-Tenodesis screw.  The tendon end was then stitched back to the periosteum of the medial aspect of the navicular with the FiberWire sutures.  The wound was irrigated copiously.  The FDL tendon sheath was repaired with 0 Vicryl simple sutures.  The subcutaneous tissue was approximated with inverted simple sutures of 3-0 Monocryl.  A running 3- 0 nylon suture was used to close the skin incision.  Sterile dressings were applied followed by a well-padded short-leg splint.  The tourniquet was released at 77 minutes after application of the dressings.  The patient was then awakened from anesthesia and transported to the recovery room in stable condition.  FOLLOWUP PLAN:  The patient will be nonweightbearing on the right lower extremity.  She will  follow up with me in 2 weeks for suture removal and conversion to a cast.     Toni Arthurs, MD     JH/MEDQ  D:  03/06/2012  T:  03/07/2012  Job:  [id number]

## 2012-03-10 ENCOUNTER — Encounter (HOSPITAL_BASED_OUTPATIENT_CLINIC_OR_DEPARTMENT_OTHER): Payer: Self-pay | Admitting: Orthopedic Surgery

## 2012-03-31 ENCOUNTER — Other Ambulatory Visit: Payer: Self-pay | Admitting: Nurse Practitioner

## 2012-04-07 ENCOUNTER — Telehealth: Payer: Self-pay | Admitting: *Deleted

## 2012-04-07 ENCOUNTER — Other Ambulatory Visit: Payer: Self-pay | Admitting: Nurse Practitioner

## 2012-04-07 NOTE — Telephone Encounter (Signed)
Pt found out that Dr. Donnie Coffin is no longer here and I answered all the questions that I could at this time.  She asked about her f/u appt for meds and I told her that we are working on a plan for that and we would get back with her as soon as we found something out and she was fine w/ that.  She however, did request that we schedule her w/ Dr. Darnelle Catalan.

## 2012-04-22 ENCOUNTER — Ambulatory Visit: Payer: 59 | Attending: Orthopedic Surgery

## 2012-04-22 DIAGNOSIS — R262 Difficulty in walking, not elsewhere classified: Secondary | ICD-10-CM | POA: Insufficient documentation

## 2012-04-22 DIAGNOSIS — IMO0001 Reserved for inherently not codable concepts without codable children: Secondary | ICD-10-CM | POA: Insufficient documentation

## 2012-04-22 DIAGNOSIS — M25579 Pain in unspecified ankle and joints of unspecified foot: Secondary | ICD-10-CM | POA: Insufficient documentation

## 2012-04-22 DIAGNOSIS — M25676 Stiffness of unspecified foot, not elsewhere classified: Secondary | ICD-10-CM | POA: Insufficient documentation

## 2012-04-22 DIAGNOSIS — M6281 Muscle weakness (generalized): Secondary | ICD-10-CM | POA: Insufficient documentation

## 2012-04-22 DIAGNOSIS — M25673 Stiffness of unspecified ankle, not elsewhere classified: Secondary | ICD-10-CM | POA: Insufficient documentation

## 2012-04-29 ENCOUNTER — Ambulatory Visit: Payer: 59

## 2012-04-29 NOTE — Progress Notes (Signed)
[facility] today.  Letter mailed to patient.  

## 2012-05-07 ENCOUNTER — Ambulatory Visit: Payer: 59 | Attending: Orthopedic Surgery | Admitting: Physical Therapy

## 2012-05-07 DIAGNOSIS — IMO0001 Reserved for inherently not codable concepts without codable children: Secondary | ICD-10-CM | POA: Insufficient documentation

## 2012-05-07 DIAGNOSIS — M25673 Stiffness of unspecified ankle, not elsewhere classified: Secondary | ICD-10-CM | POA: Insufficient documentation

## 2012-05-07 DIAGNOSIS — M25579 Pain in unspecified ankle and joints of unspecified foot: Secondary | ICD-10-CM | POA: Insufficient documentation

## 2012-05-07 DIAGNOSIS — M25676 Stiffness of unspecified foot, not elsewhere classified: Secondary | ICD-10-CM | POA: Insufficient documentation

## 2012-05-07 DIAGNOSIS — M6281 Muscle weakness (generalized): Secondary | ICD-10-CM | POA: Insufficient documentation

## 2012-05-07 DIAGNOSIS — R262 Difficulty in walking, not elsewhere classified: Secondary | ICD-10-CM | POA: Insufficient documentation

## 2012-05-09 ENCOUNTER — Other Ambulatory Visit: Payer: Self-pay | Admitting: Oncology

## 2012-07-04 ENCOUNTER — Other Ambulatory Visit: Payer: Self-pay | Admitting: Oncology

## 2012-07-08 ENCOUNTER — Telehealth: Payer: Self-pay | Admitting: *Deleted

## 2012-07-08 NOTE — Telephone Encounter (Signed)
Left message for pt to return my call so we can get her scheduled w/ a new provider.

## 2012-07-09 ENCOUNTER — Telehealth: Payer: Self-pay | Admitting: *Deleted

## 2012-07-09 ENCOUNTER — Encounter: Payer: Self-pay | Admitting: Oncology

## 2012-07-09 NOTE — Telephone Encounter (Signed)
Pt returned my call and I confirmed 08/26/12 appt w/ pt.  Mailed letter & calendar to pt.

## 2012-07-28 ENCOUNTER — Other Ambulatory Visit: Payer: Self-pay | Admitting: Oncology

## 2012-07-28 DIAGNOSIS — Z853 Personal history of malignant neoplasm of breast: Secondary | ICD-10-CM

## 2012-08-18 ENCOUNTER — Other Ambulatory Visit: Payer: Self-pay | Admitting: Oncology

## 2012-08-18 ENCOUNTER — Ambulatory Visit
Admission: RE | Admit: 2012-08-18 | Discharge: 2012-08-18 | Disposition: A | Discharge status: Home / Self Care | Payer: 59 | Source: Ambulatory Visit | Attending: Oncology | Admitting: Oncology

## 2012-08-18 DIAGNOSIS — Z853 Personal history of malignant neoplasm of breast: Secondary | ICD-10-CM

## 2012-08-19 ENCOUNTER — Encounter (INDEPENDENT_AMBULATORY_CARE_PROVIDER_SITE_OTHER): Payer: Self-pay | Admitting: General Surgery

## 2012-08-19 ENCOUNTER — Ambulatory Visit (INDEPENDENT_AMBULATORY_CARE_PROVIDER_SITE_OTHER): Payer: Commercial Managed Care - PPO | Admitting: General Surgery

## 2012-08-19 VITALS — BP 138/84 | HR 82 | Resp 18 | Ht 70.0 in

## 2012-08-19 DIAGNOSIS — T66XXXA Radiation sickness, unspecified, initial encounter: Secondary | ICD-10-CM

## 2012-08-19 DIAGNOSIS — Z853 Personal history of malignant neoplasm of breast: Secondary | ICD-10-CM | POA: Insufficient documentation

## 2012-08-19 NOTE — Addendum Note (Signed)
Addended by: Ethlyn Gallery on: 08/19/2012 10:10 AM   Modules accepted: Orders

## 2012-08-19 NOTE — Progress Notes (Signed)
Subjective:     Patient ID: Barbara Trevino, female   DOB: 02/11/1961, 52 y.o.   MRN: [id number]  HPI 16 yof with history of right breast cancer treated in 2007 with right breast lumpectomy, snbx, chemo, xrt now on femara.  She has done well except has 2 skin lesions on right breast present for about 4 months.  These have gotten a little bigger.  Had mm/us yesterday that was otherwise normal except for these 2 areas.    Review of Systems     Objective:   Physical Exam  Constitutional: She appears well-developed and well-nourished.  Pulmonary/Chest:         Assessment:     History right breast cancer Skin lesions    Plan:     I think these are likely benign but given history we discussed excisional biopsy.  I cleansed area with betadine,anesthetized with lidocaine and then performed 2 6 mm punch biopsies.  I closed these with 3-0 nylon sutures.  Dressings were placed.

## 2012-08-26 ENCOUNTER — Telehealth (INDEPENDENT_AMBULATORY_CARE_PROVIDER_SITE_OTHER): Payer: Self-pay | Admitting: General Surgery

## 2012-08-26 ENCOUNTER — Ambulatory Visit (HOSPITAL_BASED_OUTPATIENT_CLINIC_OR_DEPARTMENT_OTHER): Payer: 59 | Admitting: Family

## 2012-08-26 ENCOUNTER — Encounter: Payer: Self-pay | Admitting: Family

## 2012-08-26 VITALS — BP 144/85 | HR 80 | Temp 97.9°F | Resp 20 | Ht 70.0 in | Wt 244.0 lb

## 2012-08-26 DIAGNOSIS — C50911 Malignant neoplasm of unspecified site of right female breast: Secondary | ICD-10-CM

## 2012-08-26 DIAGNOSIS — C50919 Malignant neoplasm of unspecified site of unspecified female breast: Secondary | ICD-10-CM

## 2012-08-26 NOTE — Telephone Encounter (Signed)
Patient calling for path results. Made her aware no results available in EPIC. Made her aware I would get a message to Alisha/Dr Dwain Sarna and we may have to request them. Please call patient at [phone] or if after 3:00 pm - [phone].

## 2012-08-26 NOTE — Progress Notes (Signed)
[facility]  [phone] [phone]  OFFICE PROGRESS NOTE   ID: Barbara Trevino   DOB: [birth year]  MR#: [id number]  JYN#:[id number]   PCP: Thora Lance, MD GYN:  SU: Francina Ames, M.D. RAD ONC:  Antony Blackbird, M.D.   HISTORY OF PRESENT ILLNESS: From Dr. Theron Arista Rubin's new patient evaluation note dated 07/11/2005: "This is a pleasant 52 year old woman from [facility] referred by Dr. Maple Hudson for evaluation and treatment of breast cancer.  This is a pleasant otherwise healthy woman who has been undergoing regular screening mammography since the age of 21.  [facility]  Masses were noted bilaterally and subsequent views were recommended.  A diagnostic mammogram and bilateral ultrasound was performed on 06/29/2005.  Physical exam at that time suggested a possible soft tissue thickening at 10-11 o'clock position, right breast.  Ultrasound of the area showed spiculated area measuring 1.9 cm.  Evaluation of well thick described nodule at 10 o'clock position in the right breast was consistent with benign fibroadenoma.  A lesion on the left side was also noted.  Ultrasound showed a possible fibroadenoma in that location.  Biopsies performed 06/29/2005 left breast subareolar location showed a fibroadenoma.  Right breast at 11 o'clock position showed invasive mammary carcinoma.  This was of note intermediate grade, ER 96%, PR 100%, proliferative index 41%, HER-2 was 2+, no amplification by FISH.  Bilateral breast MRI on 07/03/2005.  The patient underwent lumpectomy, sentinel lymph node evaluation on 07/04/2005.  A lumpectomy was performed.  This showed a 1.8 cm grade 3 of 3 invasive ductal cancer, surgical margins were clear.  Lymphovascular invasion was present.  A fibroadenoma was also removed.  The one sentinel lymph node removed was negative for malignancy.  Barbara Trevino has had an unremarkable postoperative course.  She had some seroma  develop."  Her subsequent history is as detailed below.  INTERVAL HISTORY: Dr. Darnelle Catalan and I saw Barbara Trevino today for follow up of invasive ductal carcinoma of the right breast.  The patient was last seen by Dr. Donnie Coffin on 01/04/2011.  Since her last office visit, the patient has been doing relatively well.  She is establishing herself with Dr. Darrall Dears service today.  REVIEW OF SYSTEMS: A 10 point review of systems was completed and is negative except hot flashes that are better when she takes Effexor and vaginal dryness.  The patient has ongoing bilateral knee discomfort in which she states the discomfort and her right knee is worse than the left knee.  The patient underwent calcaneal osteotomy with gastrocnemius recession surgery in 03/2012 without complication.  The patient denies any other symptomatology   PAST MEDICAL HISTORY: Past Medical History  Diagnosis Date  . Cancer 2007    hx rt breast cancer-bladder cancer  . Neuropathy   . Hyperlipidemia   . GERD (gastroesophageal reflux disease)     PAST SURGICAL HISTORY: Past Surgical History  Procedure Laterality Date  . Cystoscopy with biopsy    . Abdominal hysterectomy  2001    vag hyst  . Breast surgery  2007    rt lumpectomy-snbx  . Port-a-cath removal  2007    insertion then taken out  . Carpal tunnel release  2007    left  . Bladder suspension  2009    cysto bladder cancer removed  . Breast reduction surgery  2004  . Calcaneal osteotomy  03/06/2012    Procedure: CALCANEAL OSTEOTOMY;  Surgeon: Toni Arthurs, MD;  Location: [facility];  Service: Orthopedics;  Laterality: Right;  Right Gastroc Slide; Partial Tibia Tenolysis; Flexor Digitorum Longus Transfer to Navicular; Calcaneal Osteotomy  . Gastrocnemius recession  03/06/2012    Procedure: GASTROCNEMIUS SLIDE;  Surgeon: Toni Arthurs, MD;  Location: [facility];  Service: Orthopedics;  Laterality: Right;  Right Gastroc Slide; Partial Tibia  Tenolysis; Flexor Digitorum Longus Transfer to Navicular; Calcaneal Osteotomy    Breast reduction, C-section, carpal tunnel release, previous breast biopsy, and foot surgery, and hysterectomy without oophorectomy.  FAMILY HISTORY Family History  Problem Relation Age of Onset  . Cancer Mother 73    Breast cancer  . Hypertension Mother   . Arthritis Mother   . Hypertension Father   . Arthritis Father   . CAD Father   . Stroke Father   . Migraines Sister   . Cancer Maternal Aunt     Breast Cancer  . Cancer Paternal Aunt 62    Breast Cancer  Both parents living.  One brother and one sister alive and well.  She has both maternal and paternal aunt with breast cancer.   GYNECOLOGIC HISTORY: Gravida 2, para 3, one set of twins, she has 3 sons.  Menarche at age 64, hysterectomy at age 39.  No history of hormone replacement therapy.  SOCIAL HISTORY: Barbara Trevino has been married to her husband Theodoro Grist since 72.  She works as an Scientist, forensic at [employer].  Her husband works as Psychologist, occupational for [employer].  They have 3 sons including a set of twin sons.  In her spare time she enjoys going to her son's ball games, listening to music, and reading.   ADVANCED DIRECTIVES: Not on file  HEALTH MAINTENANCE: History  Substance Use Topics  . Smoking status: Never Smoker   . Smokeless tobacco: Never Used  . Alcohol Use: No    Colonoscopy: The patient will be getting her first colonoscopy this year PAP: Not on file Bone density:  The patient's last bone density scan on 08/10/2010 showed a T score of 1.2 (normal). Lipid panel: Not on file  No Known Allergies  Current Outpatient Prescriptions  Medication Sig Dispense Refill  . Calcium-Vitamin D (CALTRATE 600 PLUS-VIT D PO) Take 1,200 mg by mouth daily.      . cholecalciferol (VITAMIN D) 1000 UNITS tablet Take 1,000 Units by mouth daily.      . meloxicam (MOBIC) 15 MG tablet Take 15 mg by mouth daily.      . pantoprazole  (PROTONIX) 20 MG tablet Take 20 mg by mouth daily.      . rosuvastatin (CRESTOR) 20 MG tablet Take 20 mg by mouth daily.      Marland Kitchen tolterodine (DETROL LA) 4 MG 24 hr capsule Take 4 mg by mouth daily.      Marland Kitchen venlafaxine XR (EFFEXOR-XR) 150 MG 24 hr capsule TAKE 1 CAPSULE BY MOUTH DAILY.  90 capsule  2  . zolpidem (AMBIEN) 5 MG tablet TAKE 1 TABLET BY MOUTH AT BEDTIME AS NEEDED  90 tablet  1   No current facility-administered medications for this visit.    OBJECTIVE: Filed Vitals:   08/26/12 1502  BP: 144/85  Pulse: 80  Temp: 97.9 F (36.6 C)  Resp: 20     Body mass index is 35.01 kg/(m^2).      ECOG FS: 1 - Symptomatic but completely ambulatory  General appearance: Alert, cooperative, well nourished, no apparent distress Head: Normocephalic, without obvious abnormality,  atraumatic Eyes: Conjunctivae/corneas clear, PERRLA, EOMI Nose: Nares, septum and mucosa are normal, no drainage or sinus tenderness Neck: No adenopathy, supple, symmetrical, trachea midline, thyroid not enlarged, no tenderness Resp: Clear to auscultation bilaterally Cardio: Regular rate and rhythm, S1, S2 normal, no murmur, click, rub or gallop Breasts: Bilateral breasts have well-healed surgical scars, no lymphadenopathy, right breast nipple retraction, bilateral axillary fullness GI: Soft, distended, non-tender, hypoactive bowel sounds, no organomegaly Skin: Left hand lacerations, bilateral leg lacerations Extremities: Extremities normal, atraumatic, no cyanosis or edema Lymph nodes: Cervical, supraclavicular, and axillary nodes normal Neurologic: Grossly normal   LAB RESULTS: Lab Results  Component Value Date   WBC 3.9 01/04/2011   NEUTROABS 2.3 01/04/2011   HGB 12.8 03/06/2012   HCT 35.5 01/04/2011   MCV 88.0 01/04/2011   PLT 206 01/04/2011      Chemistry      Component Value Date/Time   NA 138 01/04/2011 0919   K 4.4 01/04/2011 0919   CL 103 01/04/2011 0919   CO2 24 01/04/2011 0919   BUN 14 01/04/2011 0919    CREATININE 1.08 01/04/2011 0919      Component Value Date/Time   CALCIUM 9.8 01/04/2011 0919   ALKPHOS 91 01/04/2011 0919   AST 31 01/04/2011 0919   ALT 29 01/04/2011 0919   BILITOT 0.3 01/04/2011 0919       Lab Results  Component Value Date   LABCA2 16 01/04/2011    Urinalysis    Component Value Date/Time   COLORURINE YELLOW 06/15/2009 1123   APPEARANCEUR CLEAR 06/15/2009 1123   LABSPEC 1.025 02/19/2010 1621   PHURINE 5.5 02/19/2010 1621   GLUCOSEU NEGATIVE 02/19/2010 1621   HGBUR LARGE* 02/19/2010 1621   BILIRUBINUR SMALL* 02/19/2010 1621   KETONESUR NEGATIVE 02/19/2010 1621   PROTEINUR 30* 02/19/2010 1621   UROBILINOGEN 0.2 02/19/2010 1621   NITRITE NEGATIVE 02/19/2010 1621   LEUKOCYTESUR MODERATE Biochemical Testing Only. Please order routine urinalysis from main lab if confirmatory testing is needed.* 02/19/2010 1621    STUDIES: US Breast Right and Mm Digital Diagnostic Bilat 08/18/2012   *RADIOLOGY REPORT*  Clinical Data:  The patient has 2 new skin lesions on the upper- outer quadrant of the right breast. The patient first noticed a small papule in the 10 o'clock location of the right breast which became pink over the last 3 months.  History of right lumpectomy with radiation therapy and chemotherapy in 2007.  The patient also has a history of bladder cancer.  The patient's mother was diagnosed with breast cancer at age 31.  2 aunts were diagnosed breast cancer at ages 98 and 60.  DIGITAL DIAGNOSTIC BILATERAL MAMMOGRAM WITH CAD AND RIGHT BREAST ULTRASOUND:  Comparison:  08/10/2010 and earlier  Findings:  ACR Breast Density Category 2: There is a scattered fibroglandular pattern.  Postoperative changes are identified in the upper-outer quadrant of the right breast.  No suspicious mass, distortion, or microcalcifications identified.  Mammographic images were processed with CAD.  On physical exam, there is well healed right lumpectomy scar in the upper outer quadrant.  Lower axillary  lymph node dissection scar is noted, well healed.  2 discrete papules are identified in the upper- outer quadrant of the right breast.  The larger is approximately 8 mm in diameter and is faintly peak.  The patient reports this is nontender.  The second is slightly smaller, measuring 5 mm and palpable but not visible.  Ultrasound is performed, showing the both lesions are entirely intradermal, without associated underlying  parenchymal features or increased blood flow.  Evaluation of the right axilla is negative.  IMPRESSION:  1.  No mammographic evidence for malignancy within the breast. 2.  No evidence for adenopathy. 3.  The new skin lesions warrant tissue diagnosis to exclude dermal recurrence.  Consider punch biopsy.  RECOMMENDATION: Surgical consultation is recommended.  The patient will follow up with Dr. Dwain Sarna for an appointment.  I have discussed the findings and recommendations with the patient. Results were also provided in writing at the conclusion of the visit.  If applicable, a reminder letter will be sent to the patient regarding the next appointment.  BI-RADS CATEGORY 4:  Suspicious abnormality - biopsy should be considered.   Original Report Authenticated By: Norva Pavlov, M.D.     ASSESSMENT: 52 y.o. [place], [place] woman: 1.  Status right breast post needle core biopsy at the 11:00 position and of the left breast subareolar area on 06/29/2005 showed a fibroadenoma of the left breast and an invasive mammary carcinoma of the right breast, ER 96%, PR 100%, Ki-67 41%, HER-2/neu 2+, but further HER-2/neu analysis by [facility] showed no amplification with a ratio of 1.5.  2.  Status post right breast wire/needle localized lumpectomy with right axillary sentinel node biopsy on 07/04/2005 for a stage I, pT1c pN0 (i-) (sn), 1.8 cm invasive ductal carcinoma with lymphovascular space invasion present, fibroadenoma that was 1.5 cm, and sclerotic lobules with microcalcifications, grade 3,  ER 96%, PR 100%, Ki-67 41%, HER-2/neu by FISH negative with 0/1 metastatic axillary lymph nodes.  3.  Status post adjuvant chemotherapy with FEC x 3 cycles from 08/02/2005 through 09/14/2005 with Neulasta support.  This was followed by adjuvant chemotherapy with Taxotere x 3 cycles from 10/05/2005 through 11/16/2005 with Neulasta support.  4.  Status post radiation therapy from 12/11/2005 through 01/28/2006.   5.  The patient started antiestrogen therapy with Tamoxifen in 01/2006.  Antiestrogen therapy was changed to Femara in 07/2007.  6.  The patient had genetic testing in which a letter dated 04/08/2007 states that the patient was negative for a mutation in the BRCA1 or BRCA2 breast cancer genes.  7.  Status post salpingo-oophorectomy in 06/2007.  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  8.  Status post bilateral digital diagnostic mammogram on 08/18/2012 showed no mammographic evidence for malignancy within the breast.  No evidence for adenopathy.  New skin lesions warrant tissue diagnosis to exclude dermal recurrence.  9.  The patient's last bone density scan on 08/10/2010 showed a T score of 1.2 (normal).  10.  The patient had two 6 mm punch biopsies with Dr. Dwain Sarna on 08/19/2012 in which Dr. Dwain Sarna believes the breast skin lesions are benign.  PLAN: Dr. Darnelle Catalan received verbal pathology report results today during the patient's office visit from recent breast punch biopsies in which it was found that her skin lesions were as a result of radiation changes and are benign.  The patient is over 7 years from her time of diagnosis and will officially become a graduate of [facility]'s breast cancer program today.  We asked that she continue annual clinical breast examinations by a physician in addition to annual mammography.  Her last mammogram results are listed above.  She will be due for her annual mammogram in 08/19/13.  The patient was told she  could stop taking antiestrogen therapy with Femara today.  She has had almost 7 years of antiestrogen therapy.  All questions were answered.  The patient was encouraged to contact us with any problems, questions or concerns.   Larina Bras, NP-C 08/26/2012, 7:06 PM

## 2012-08-26 NOTE — Patient Instructions (Addendum)
Please contact us at [phone] if you have any questions or concerns.  Please continue to do well and enjoy life!!!  Get plenty of rest, drink plenty of water, exercise daily (walk), eat a balanced diet.  Take vitamin D3 1000 IUs daily.   Complete monthly self-breast examinations.  Have a clinical breast exam by a physician every year  Have your mammogram completed every year.

## 2012-11-24 ENCOUNTER — Other Ambulatory Visit: Payer: Self-pay | Admitting: Oncology

## 2012-11-25 ENCOUNTER — Encounter: Payer: Self-pay | Admitting: *Deleted

## 2012-11-25 NOTE — Telephone Encounter (Signed)
Send to PCP, Dr Blair Heys, Patient discharged from practice

## 2013-02-05 ENCOUNTER — Other Ambulatory Visit: Payer: Self-pay

## 2013-04-19 ENCOUNTER — Emergency Department (HOSPITAL_COMMUNITY)
Admission: EM | Admit: 2013-04-19 | Discharge: 2013-04-19 | Disposition: A | Discharge status: Home / Self Care | Payer: 59 | Source: Home / Self Care | Attending: Emergency Medicine | Admitting: Emergency Medicine

## 2013-04-19 ENCOUNTER — Encounter (HOSPITAL_COMMUNITY): Payer: Self-pay | Admitting: Emergency Medicine

## 2013-04-19 DIAGNOSIS — N10 Acute tubulo-interstitial nephritis: Secondary | ICD-10-CM

## 2013-04-19 LAB — POCT URINALYSIS DIP (DEVICE)
BILIRUBIN URINE: NEGATIVE
Glucose, UA: NEGATIVE mg/dL
KETONES UR: NEGATIVE mg/dL
Nitrite: NEGATIVE
PH: 7.5 (ref 5.0–8.0)
Protein, ur: 100 mg/dL — AB
SPECIFIC GRAVITY, URINE: 1.015 (ref 1.005–1.030)
Urobilinogen, UA: 1 mg/dL (ref 0.0–1.0)

## 2013-04-19 MED ORDER — IBUPROFEN 800 MG PO TABS
ORAL_TABLET | ORAL | Status: AC
  Filled 2013-04-19: qty 1

## 2013-04-19 MED ORDER — CEFTRIAXONE SODIUM 1 G IJ SOLR
1.0000 g | Freq: Once | INTRAMUSCULAR | Status: AC
  Administered 2013-04-19: 1 g via INTRAMUSCULAR

## 2013-04-19 MED ORDER — CEFTRIAXONE SODIUM 1 G IJ SOLR
INTRAMUSCULAR | Status: AC
  Filled 2013-04-19: qty 10

## 2013-04-19 MED ORDER — IBUPROFEN 800 MG PO TABS
800.0000 mg | ORAL_TABLET | Freq: Once | ORAL | Status: AC
  Administered 2013-04-19: 800 mg via ORAL

## 2013-04-19 MED ORDER — LIDOCAINE HCL (PF) 1 % IJ SOLN
INTRAMUSCULAR | Status: AC
  Filled 2013-04-19: qty 5

## 2013-04-19 MED ORDER — OXYCODONE-ACETAMINOPHEN 5-325 MG PO TABS: ORAL_TABLET | ORAL | Status: DC

## 2013-04-19 MED ORDER — CIPROFLOXACIN HCL 500 MG PO TABS: 500.0000 mg | ORAL_TABLET | Freq: Two times a day (BID) | ORAL | Status: DC

## 2013-04-19 NOTE — ED Notes (Signed)
All injections and medications given by Orland Penman, CMA

## 2013-04-19 NOTE — ED Provider Notes (Signed)
Chief Complaint   Chief Complaint  Patient presents with  . Urinary Tract Infection    History of Present Illness   Barbara Trevino is a 53 year old female who has had a three-day history of bladder pressure, urinary urgency, frequency, and cloudy, malodorous urine. She denies any dysuria or hematuria. She's had aching in her right CVA area, chills, nausea. No fever, vomiting, or abdominal pain. She has had a urinary tract infection once in the past. She also has a history of a bladder tumor which has been completely resected.  Review of Systems   Other than as noted above, the patient denies any of the following symptoms: General:  No fevers, chills, or sweats. GI:  No abdominal pain, back pain, nausea, vomiting, diarrhea, or constipation. GU:  No dysuria, frequency, urgency, hematuria, or incontinence. GYN:  No discharge, itching, vulvar pain or lesions, pelvic pain, or abnormal vaginal bleeding.   [place]   Past medical history, family history, social history, meds, and allergies were reviewed.  She has no known medical allergies. Current meds include meloxicam, Protonix, Crestor, Detrol, Effexor, and Ambien. She has a history of breast cancer, bladder cancer, neuropathy, hyperlipidemia, and GERD.  Physical Examination     Vital signs:  BP 126/78  Pulse 102  Temp(Src) 99.7 F (37.6 C) (Oral)  Resp 18  SpO2 100% Gen:  Alert, oriented, in no distress. Lungs:  Clear to auscultation, no wheezes, rales or rhonchi. Heart:  Regular rhythm, no gallop or murmer. Abdomen:  Flat and soft. There was slight suprapubic pain to palpation.  No guarding, or rebound.  No hepato-splenomegaly or mass.  Bowel sounds were normally active.  No hernia. Back:  She has moderate right CVA tenderness.  Skin:  Clear, warm and dry.  Labs   Results for orders placed during the hospital encounter of 04/19/13  POCT URINALYSIS DIP (DEVICE)      Result Value Range   Glucose, UA NEGATIVE  NEGATIVE mg/dL   Bilirubin Urine NEGATIVE  NEGATIVE   Ketones, ur NEGATIVE  NEGATIVE mg/dL   Specific Gravity, Urine 1.015  1.005 - 1.030   Hgb urine dipstick MODERATE (*) NEGATIVE   pH 7.5  5.0 - 8.0   Protein, ur 100 (*) NEGATIVE mg/dL   Urobilinogen, UA 1.0  0.0 - 1.0 mg/dL   Nitrite NEGATIVE  NEGATIVE   Leukocytes, UA LARGE (*) NEGATIVE     A urine culture was obtained.  Results are pending at this time and we will call about any positive results.  Course in [facility] [place]   Given Rocephin 1 g IM and ibuprofen 800 mg by mouth.  Assessment   The encounter diagnosis was Acute pyelonephritis.   Plan   1.  Meds:  The following meds were prescribed:   Discharge Medication List as of 04/19/2013 10:34 AM    START taking these medications   Details  ciprofloxacin (CIPRO) 500 MG tablet Take 1 tablet (500 mg total) by mouth every 12 (twelve) hours., Starting 04/19/2013, Until Discontinued, Normal    oxyCODONE-acetaminophen (PERCOCET) 5-325 MG per tablet 1 to 2 tablets every 6 hours as needed for pain., Print        2.  Patient Education/Counseling:  The patient was given appropriate handouts, self care instructions, and instructed in symptomatic relief. The patient was told to avoid intercourse for 10 days, get extra fluids, and return for a follow up with her primary care doctor at the completion of treatment for a repeat UA and  culture.  3.  Follow up:  The patient was told to follow up here if no better in 3 to 4 days, or sooner if becoming worse in any way, and given some red flag symptoms such as fever, persistent vomiting, or severe flank or abdominal pain which would prompt immediate return. Suggested followup with her primary care physician or urologist in 48 hours.     Harden Mo, MD 04/19/13 [phone]

## 2013-04-19 NOTE — ED Notes (Addendum)
Pt c/o urinary frequency with pressure x 2 days. Has cloudy urine. Denies fever. Pt is alert and oriented. Desiree B, CMA.

## 2013-04-19 NOTE — Discharge Instructions (Signed)

## 2013-04-21 LAB — URINE CULTURE: SPECIAL REQUESTS: NORMAL

## 2013-04-21 NOTE — ED Notes (Signed)
Urine culture: >100,000 colonies E. Coli.  Pt. adequately treated with Cipro. Barbara Trevino 04/21/2013

## 2013-07-09 ENCOUNTER — Other Ambulatory Visit (HOSPITAL_COMMUNITY): Payer: Self-pay | Admitting: Orthopaedic Surgery

## 2013-07-09 DIAGNOSIS — M545 Low back pain, unspecified: Secondary | ICD-10-CM

## 2013-07-09 DIAGNOSIS — R52 Pain, unspecified: Secondary | ICD-10-CM

## 2013-07-15 ENCOUNTER — Ambulatory Visit (HOSPITAL_COMMUNITY)
Admission: RE | Admit: 2013-07-15 | Discharge: 2013-07-15 | Disposition: A | Discharge status: Home / Self Care | Payer: 59 | Source: Ambulatory Visit | Attending: Orthopaedic Surgery | Admitting: Orthopaedic Surgery

## 2013-07-15 DIAGNOSIS — M545 Low back pain, unspecified: Secondary | ICD-10-CM

## 2013-07-15 DIAGNOSIS — M48061 Spinal stenosis, lumbar region without neurogenic claudication: Secondary | ICD-10-CM | POA: Insufficient documentation

## 2013-07-15 DIAGNOSIS — M5144 Schmorl's nodes, thoracic region: Secondary | ICD-10-CM | POA: Insufficient documentation

## 2013-07-15 DIAGNOSIS — M25569 Pain in unspecified knee: Secondary | ICD-10-CM | POA: Insufficient documentation

## 2013-07-15 DIAGNOSIS — R52 Pain, unspecified: Secondary | ICD-10-CM

## 2013-07-15 DIAGNOSIS — M47817 Spondylosis without myelopathy or radiculopathy, lumbosacral region: Secondary | ICD-10-CM | POA: Insufficient documentation

## 2013-07-15 DIAGNOSIS — M5126 Other intervertebral disc displacement, lumbar region: Secondary | ICD-10-CM | POA: Insufficient documentation

## 2013-07-15 DIAGNOSIS — C50919 Malignant neoplasm of unspecified site of unspecified female breast: Secondary | ICD-10-CM | POA: Insufficient documentation

## 2013-09-07 ENCOUNTER — Ambulatory Visit
Admission: RE | Admit: 2013-09-07 | Discharge: 2013-09-07 | Disposition: A | Discharge status: Home / Self Care | Payer: 59 | Source: Ambulatory Visit

## 2013-09-07 ENCOUNTER — Other Ambulatory Visit: Payer: Self-pay

## 2013-09-07 DIAGNOSIS — Z1231 Encounter for screening mammogram for malignant neoplasm of breast: Secondary | ICD-10-CM

## 2014-09-13 ENCOUNTER — Other Ambulatory Visit: Payer: Self-pay

## 2014-09-13 DIAGNOSIS — Z1231 Encounter for screening mammogram for malignant neoplasm of breast: Secondary | ICD-10-CM

## 2014-09-20 ENCOUNTER — Ambulatory Visit
Admission: RE | Admit: 2014-09-20 | Discharge: 2014-09-20 | Disposition: A | Discharge status: Home / Self Care | Payer: 59 | Source: Ambulatory Visit

## 2014-09-20 DIAGNOSIS — Z1231 Encounter for screening mammogram for malignant neoplasm of breast: Secondary | ICD-10-CM

## 2014-10-05 ENCOUNTER — Ambulatory Visit: Payer: 59

## 2014-12-10 ENCOUNTER — Encounter: Payer: Self-pay | Admitting: Genetic Counselor

## 2015-02-05 ENCOUNTER — Telehealth: Payer: 59 | Admitting: Nurse Practitioner

## 2015-02-05 DIAGNOSIS — N3000 Acute cystitis without hematuria: Secondary | ICD-10-CM | POA: Diagnosis not present

## 2015-02-05 MED ORDER — SULFAMETHOXAZOLE-TRIMETHOPRIM 800-160 MG PO TABS
1.0000 | ORAL_TABLET | Freq: Two times a day (BID) | ORAL | Status: DC
Start: 2015-02-05 — End: 2015-02-05

## 2015-02-05 MED ORDER — NITROFURANTOIN MONOHYD MACRO 100 MG PO CAPS: 100.0000 mg | ORAL_CAPSULE | Freq: Two times a day (BID) | ORAL | Status: DC

## 2015-02-05 NOTE — Addendum Note (Signed)
Addended by: Chevis Pretty on: 02/05/2015 02:01 PM   Modules accepted: Orders, Medications

## 2015-02-05 NOTE — Addendum Note (Signed)
Addended by: Chevis Pretty on: 02/05/2015 01:49 PM   Modules accepted: Orders

## 2015-02-05 NOTE — Progress Notes (Signed)

## 2015-04-08 DIAGNOSIS — M1711 Unilateral primary osteoarthritis, right knee: Secondary | ICD-10-CM | POA: Diagnosis not present

## 2015-04-08 MED FILL — HYDROCODON-APAP 5-325: 5-325 | 10 days supply | Qty: 40 | Fill #0

## 2015-04-08 MED FILL — traMADol HCL 50 MG TABS: 50 | 8 days supply | Qty: 60 | Fill #0

## 2015-04-12 DIAGNOSIS — R350 Frequency of micturition: Secondary | ICD-10-CM | POA: Diagnosis not present

## 2015-04-12 DIAGNOSIS — Z0000 Encounter for general adult medical examination without abnormal findings: Secondary | ICD-10-CM | POA: Diagnosis not present

## 2015-04-12 DIAGNOSIS — R300 Dysuria: Secondary | ICD-10-CM | POA: Diagnosis not present

## 2015-04-12 DIAGNOSIS — N390 Urinary tract infection, site not specified: Secondary | ICD-10-CM | POA: Diagnosis not present

## 2015-04-21 MED FILL — traZODone HCL 50 MG TABS: 50 | 30 days supply | Qty: 90 | Fill #3

## 2015-04-21 MED FILL — VENLAFAXINE HCL ER 150 MG C: 150 | 90 days supply | Qty: 90 | Fill #0

## 2015-05-03 DIAGNOSIS — Z0000 Encounter for general adult medical examination without abnormal findings: Secondary | ICD-10-CM | POA: Diagnosis not present

## 2015-05-03 DIAGNOSIS — B961 Klebsiella pneumoniae [K. pneumoniae] as the cause of diseases classified elsewhere: Secondary | ICD-10-CM | POA: Diagnosis not present

## 2015-05-03 DIAGNOSIS — N390 Urinary tract infection, site not specified: Secondary | ICD-10-CM | POA: Diagnosis not present

## 2015-05-04 MED FILL — MYRBETRIQ ER 50 MG TABLET: 50 | 90 days supply | Qty: 90 | Fill #2

## 2015-05-04 MED FILL — MELOXICAM 15 MG TABLET: 15 | 90 days supply | Qty: 90 | Fill #1

## 2015-05-06 MED FILL — CIPROFLOXACIN HCL 250 MG TA: 250 | 3 days supply | Qty: 6 | Fill #0

## 2015-05-06 MED FILL — traMADol HCL 50 MG TABS: 50 | 8 days supply | Qty: 60 | Fill #0

## 2015-05-06 MED FILL — HYDROCODON-APAP 5-325: 5-325 | 10 days supply | Qty: 40 | Fill #0

## 2015-05-06 MED FILL — TRIMETHOPRIM 100 MG TABLET: 100 | 6 days supply | Qty: 6 | Fill #0

## 2015-05-09 MED FILL — CIPROFLOXACIN HCL 500 MG TA: 500 | 7 days supply | Qty: 14 | Fill #0

## 2015-05-25 DIAGNOSIS — N390 Urinary tract infection, site not specified: Secondary | ICD-10-CM | POA: Diagnosis not present

## 2015-06-01 DIAGNOSIS — L821 Other seborrheic keratosis: Secondary | ICD-10-CM | POA: Diagnosis not present

## 2015-06-01 DIAGNOSIS — C44619 Basal cell carcinoma of skin of left upper limb, including shoulder: Secondary | ICD-10-CM | POA: Diagnosis not present

## 2015-06-01 DIAGNOSIS — L720 Epidermal cyst: Secondary | ICD-10-CM | POA: Diagnosis not present

## 2015-06-01 DIAGNOSIS — D485 Neoplasm of uncertain behavior of skin: Secondary | ICD-10-CM | POA: Diagnosis not present

## 2015-06-01 DIAGNOSIS — L570 Actinic keratosis: Secondary | ICD-10-CM | POA: Diagnosis not present

## 2015-06-01 DIAGNOSIS — L738 Other specified follicular disorders: Secondary | ICD-10-CM | POA: Diagnosis not present

## 2015-06-01 DIAGNOSIS — L565 Disseminated superficial actinic porokeratosis (DSAP): Secondary | ICD-10-CM | POA: Diagnosis not present

## 2015-06-09 MED FILL — HYDROCODON-APAP 5-325: 5-325 | 10 days supply | Qty: 40 | Fill #0

## 2015-06-09 MED FILL — ROSUVASTATIN CALCIUM 20 MG: 20 | 90 days supply | Qty: 90 | Fill #2

## 2015-06-09 MED FILL — AMITRIPTYLINE HCL 25 MG TAB: 25 | 90 days supply | Qty: 180 | Fill #2

## 2015-06-09 MED FILL — GABAPENTIN 100 MG CAPSULE: 100 | 30 days supply | Qty: 120 | Fill #1

## 2015-06-09 MED FILL — PANTOPRAZOLE SOD DR 40 MG T: 40 | 90 days supply | Qty: 90 | Fill #2

## 2015-06-09 MED FILL — traMADol HCL 50 MG TABS: 50 | 8 days supply | Qty: 60 | Fill #0

## 2015-06-14 MED FILL — traZODone HCL 50 MG TABS: 50 | 30 days supply | Qty: 90 | Fill #4

## 2015-07-14 MED FILL — traMADol HCL 50 MG TABS: 50 | 8 days supply | Qty: 60 | Fill #0

## 2015-07-14 MED FILL — HYDROCODON-APAP 5-325: 5-325 | 10 days supply | Qty: 40 | Fill #0

## 2015-07-25 MED FILL — MYRBETRIQ ER 50 MG TABLET: 50 | 90 days supply | Qty: 90 | Fill #3

## 2015-07-25 MED FILL — traZODone HCL 50 MG TABS: 50 | 30 days supply | Qty: 90 | Fill #5

## 2015-07-25 MED FILL — GABAPENTIN 100 MG CAPSULE: 100 | 30 days supply | Qty: 120 | Fill #2

## 2015-07-26 MED FILL — VENLAFAXINE HCL ER 150 MG C: 150 | 90 days supply | Qty: 90 | Fill #0

## 2015-08-11 MED FILL — MELOXICAM 15 MG TABLET: 15 | 90 days supply | Qty: 90 | Fill #0

## 2015-08-18 ENCOUNTER — Other Ambulatory Visit: Payer: Self-pay

## 2015-08-18 ENCOUNTER — Other Ambulatory Visit: Payer: Self-pay | Admitting: General Surgery

## 2015-08-18 DIAGNOSIS — N644 Mastodynia: Secondary | ICD-10-CM

## 2015-08-22 ENCOUNTER — Ambulatory Visit
Admission: RE | Admit: 2015-08-22 | Discharge: 2015-08-22 | Disposition: A | Discharge status: Home / Self Care | Payer: 59 | Source: Ambulatory Visit | Attending: General Surgery | Admitting: General Surgery

## 2015-08-22 DIAGNOSIS — N644 Mastodynia: Secondary | ICD-10-CM

## 2015-08-22 DIAGNOSIS — R928 Other abnormal and inconclusive findings on diagnostic imaging of breast: Secondary | ICD-10-CM | POA: Diagnosis not present

## 2015-08-22 MED FILL — HYDROCODON-APAP 5-325: 5-325 | 10 days supply | Qty: 40 | Fill #0

## 2015-08-22 MED FILL — traMADol HCL 50 MG TABS: 50 | 8 days supply | Qty: 60 | Fill #0

## 2015-09-23 ENCOUNTER — Other Ambulatory Visit: Payer: Self-pay | Admitting: Family Medicine

## 2015-09-23 DIAGNOSIS — Z1231 Encounter for screening mammogram for malignant neoplasm of breast: Secondary | ICD-10-CM

## 2015-09-23 MED FILL — PANTOPRAZOLE SOD DR 40 MG T: 40 | 90 days supply | Qty: 90 | Fill #0

## 2015-09-23 MED FILL — traZODone HCL 50 MG TABS: 50 | 30 days supply | Qty: 90 | Fill #6

## 2015-09-23 MED FILL — ROSUVASTATIN CALCIUM 20 MG: 20 | 90 days supply | Qty: 90 | Fill #0

## 2015-09-28 ENCOUNTER — Ambulatory Visit
Admission: RE | Admit: 2015-09-28 | Discharge: 2015-09-28 | Disposition: A | Discharge status: Home / Self Care | Payer: 59 | Source: Ambulatory Visit | Attending: Family Medicine | Admitting: Family Medicine

## 2015-09-28 DIAGNOSIS — Z1231 Encounter for screening mammogram for malignant neoplasm of breast: Secondary | ICD-10-CM

## 2015-09-28 MED FILL — HYDROCODON-APAP 5-325: 5-325 | 10 days supply | Qty: 40 | Fill #0

## 2015-09-28 MED FILL — traMADol HCL 50 MG TABS: 50 | 8 days supply | Qty: 60 | Fill #0

## 2015-11-02 DIAGNOSIS — E669 Obesity, unspecified: Secondary | ICD-10-CM | POA: Diagnosis not present

## 2015-11-02 DIAGNOSIS — R3911 Hesitancy of micturition: Secondary | ICD-10-CM | POA: Diagnosis not present

## 2015-11-02 DIAGNOSIS — E782 Mixed hyperlipidemia: Secondary | ICD-10-CM | POA: Diagnosis not present

## 2015-11-02 DIAGNOSIS — G620 Drug-induced polyneuropathy: Secondary | ICD-10-CM | POA: Diagnosis not present

## 2015-11-02 DIAGNOSIS — R609 Edema, unspecified: Secondary | ICD-10-CM | POA: Diagnosis not present

## 2015-11-02 DIAGNOSIS — N183 Chronic kidney disease, stage 3 (moderate): Secondary | ICD-10-CM | POA: Diagnosis not present

## 2015-11-02 DIAGNOSIS — R748 Abnormal levels of other serum enzymes: Secondary | ICD-10-CM | POA: Diagnosis not present

## 2015-11-02 DIAGNOSIS — M1990 Unspecified osteoarthritis, unspecified site: Secondary | ICD-10-CM | POA: Diagnosis not present

## 2015-11-02 DIAGNOSIS — K219 Gastro-esophageal reflux disease without esophagitis: Secondary | ICD-10-CM | POA: Diagnosis not present

## 2015-11-02 MED FILL — traZODone HCL 50 MG TABS: 50 | 90 days supply | Qty: 270 | Fill #0

## 2015-11-02 MED FILL — GABAPENTIN 100 MG CAPSULE: 100 | 90 days supply | Qty: 360 | Fill #0

## 2015-11-02 MED FILL — AMITRIPTYLINE HCL 25 MG TAB: 25 | 90 days supply | Qty: 180 | Fill #0

## 2015-11-02 MED FILL — VENLAFAXINE HCL ER 150 MG C: 150 | 90 days supply | Qty: 90 | Fill #0

## 2015-11-02 MED FILL — CIPROFLOXACIN HCL 500 MG TA: 500 | 7 days supply | Qty: 14 | Fill #0

## 2015-11-02 MED FILL — HYDROCODON-APAP 5-325: 5-325 | 10 days supply | Qty: 40 | Fill #0

## 2015-11-02 MED FILL — traMADol HCL 50 MG TABS: 50 | 8 days supply | Qty: 60 | Fill #0

## 2015-11-07 ENCOUNTER — Other Ambulatory Visit (HOSPITAL_COMMUNITY): Payer: Self-pay | Admitting: Family Medicine

## 2015-11-07 DIAGNOSIS — R748 Abnormal levels of other serum enzymes: Secondary | ICD-10-CM

## 2015-11-09 ENCOUNTER — Ambulatory Visit (HOSPITAL_COMMUNITY): Payer: 59

## 2015-11-14 MED FILL — MELOXICAM 15 MG TABLET: 15 | 90 days supply | Qty: 90 | Fill #1

## 2015-11-14 MED FILL — MYRBETRIQ ER 50 MG TABLET: 50 | 90 days supply | Qty: 90 | Fill #0

## 2015-11-16 DIAGNOSIS — Z8551 Personal history of malignant neoplasm of bladder: Secondary | ICD-10-CM | POA: Diagnosis not present

## 2015-11-16 DIAGNOSIS — N3946 Mixed incontinence: Secondary | ICD-10-CM | POA: Diagnosis not present

## 2015-12-09 MED FILL — PANTOPRAZOLE SOD DR 40 MG T: 40 | 90 days supply | Qty: 90 | Fill #0

## 2015-12-09 MED FILL — traMADol HCL 50 MG TABS: 50 | 8 days supply | Qty: 60 | Fill #0

## 2015-12-09 MED FILL — HYDROCODON-APAP 5-325: 5-325 | 10 days supply | Qty: 40 | Fill #0

## 2016-01-13 MED FILL — traMADol HCL 50 MG TABS: 50 | 5 days supply | Qty: 60 | Fill #0

## 2016-01-20 MED FILL — valACYclovir HCL 1 GM TABS: 1 | 6 days supply | Qty: 24 | Fill #0

## 2016-01-23 DIAGNOSIS — M1712 Unilateral primary osteoarthritis, left knee: Secondary | ICD-10-CM | POA: Diagnosis not present

## 2016-01-23 DIAGNOSIS — M1711 Unilateral primary osteoarthritis, right knee: Secondary | ICD-10-CM | POA: Diagnosis not present

## 2016-01-23 MED FILL — HYDROCODON-APAP 5-325: 5-325 | 10 days supply | Qty: 40 | Fill #0

## 2016-01-30 DIAGNOSIS — M1712 Unilateral primary osteoarthritis, left knee: Secondary | ICD-10-CM | POA: Diagnosis not present

## 2016-01-30 DIAGNOSIS — M1711 Unilateral primary osteoarthritis, right knee: Secondary | ICD-10-CM | POA: Diagnosis not present

## 2016-02-02 MED FILL — VENLAFAXINE HCL ER 150 MG C: 150 | 90 days supply | Qty: 90 | Fill #1

## 2016-02-02 MED FILL — MYRBETRIQ ER 50 MG TABLET: 50 | 90 days supply | Qty: 90 | Fill #1

## 2016-02-02 MED FILL — AMITRIPTYLINE HCL 25 MG TAB: 25 | 90 days supply | Qty: 180 | Fill #1

## 2016-02-02 MED FILL — GABAPENTIN 100 MG CAPSULE: 100 | 90 days supply | Qty: 360 | Fill #1

## 2016-02-03 MED FILL — MELOXICAM 15 MG TABLET: 15 | 90 days supply | Qty: 90 | Fill #0

## 2016-02-06 DIAGNOSIS — M1712 Unilateral primary osteoarthritis, left knee: Secondary | ICD-10-CM | POA: Diagnosis not present

## 2016-02-06 DIAGNOSIS — M1711 Unilateral primary osteoarthritis, right knee: Secondary | ICD-10-CM | POA: Diagnosis not present

## 2016-02-13 DIAGNOSIS — M1711 Unilateral primary osteoarthritis, right knee: Secondary | ICD-10-CM | POA: Diagnosis not present

## 2016-02-13 DIAGNOSIS — M1712 Unilateral primary osteoarthritis, left knee: Secondary | ICD-10-CM | POA: Diagnosis not present

## 2016-02-20 DIAGNOSIS — M1711 Unilateral primary osteoarthritis, right knee: Secondary | ICD-10-CM | POA: Diagnosis not present

## 2016-02-20 DIAGNOSIS — M1712 Unilateral primary osteoarthritis, left knee: Secondary | ICD-10-CM | POA: Diagnosis not present

## 2016-02-21 MED FILL — HYDROCODON-APAP 5-325: 5-325 | 10 days supply | Qty: 40 | Fill #0

## 2016-02-21 MED FILL — traMADol HCL 50 MG TABS: 50 | 8 days supply | Qty: 60 | Fill #0

## 2016-03-19 MED FILL — DYMISTA NASAL SPRAY: 137-50 | 30 days supply | Qty: 23 | Fill #0

## 2016-04-03 MED FILL — PANTOPRAZOLE SOD DR 40 MG T: 40 | 90 days supply | Qty: 90 | Fill #1

## 2016-04-03 MED FILL — traZODone HCL 50 MG TABS: 50 | 90 days supply | Qty: 270 | Fill #1

## 2016-04-11 MED FILL — HYDROCODON-APAP 5-325: 5-325 | 10 days supply | Qty: 40 | Fill #0

## 2016-04-11 MED FILL — traMADol HCL 50 MG TABS: 50 | 8 days supply | Qty: 60 | Fill #0

## 2016-05-01 MED FILL — AMITRIPTYLINE HCL 25 MG TAB: 25 | 90 days supply | Qty: 180 | Fill #2

## 2016-05-01 MED FILL — VENLAFAXINE HCL ER 150 MG C: 150 | 90 days supply | Qty: 90 | Fill #2

## 2016-05-01 MED FILL — MELOXICAM 15 MG TABLET: 15 | 90 days supply | Qty: 90 | Fill #1

## 2016-05-07 MED FILL — MYRBETRIQ ER 50 MG TABLET: 50 | 30 days supply | Qty: 30 | Fill #0

## 2016-05-23 MED FILL — HYDROCODON-APAP 5-325: 5-325 | 10 days supply | Qty: 40 | Fill #0

## 2016-05-23 MED FILL — traMADol HCL 50 MG TABS: 50 | 8 days supply | Qty: 60 | Fill #0

## 2016-06-20 MED FILL — MYRBETRIQ ER 50 MG TABLET: 50 | 90 days supply | Qty: 90 | Fill #1

## 2016-07-03 MED FILL — HYDROCODON-APAP 5-325: 5-325 | 10 days supply | Qty: 40 | Fill #0

## 2016-07-03 MED FILL — PANTOPRAZOLE SOD DR 40 MG T: 40 | 90 days supply | Qty: 90 | Fill #2

## 2016-07-03 MED FILL — traMADol HCL 50 MG TABS: 50 | 7 days supply | Qty: 60 | Fill #0

## 2016-07-09 DIAGNOSIS — H5213 Myopia, bilateral: Secondary | ICD-10-CM | POA: Diagnosis not present

## 2016-07-09 DIAGNOSIS — H524 Presbyopia: Secondary | ICD-10-CM | POA: Diagnosis not present

## 2016-07-23 MED FILL — AMITRIPTYLINE HCL 25 MG TAB: 25 | 90 days supply | Qty: 180 | Fill #3

## 2016-07-23 MED FILL — traZODone HCL 50 MG TABS: 50 | 90 days supply | Qty: 270 | Fill #2

## 2016-07-23 MED FILL — VENLAFAXINE HCL ER 150 MG C: 150 | 90 days supply | Qty: 90 | Fill #3

## 2016-07-24 MED FILL — MELOXICAM 15 MG TABLET: 15 | 90 days supply | Qty: 90 | Fill #0

## 2016-08-13 MED FILL — HYDROCODON-APAP 5-325: 5-325 | 10 days supply | Qty: 40 | Fill #0

## 2016-08-13 MED FILL — traMADol HCL 50 MG TABS: 50 | 8 days supply | Qty: 60 | Fill #0

## 2016-09-07 MED FILL — valACYclovir HCL 1 GM TABS: 1 | 6 days supply | Qty: 24 | Fill #1

## 2016-09-17 MED FILL — MYRBETRIQ ER 50 MG TABLET: 50 | 90 days supply | Qty: 90 | Fill #2

## 2016-10-01 ENCOUNTER — Ambulatory Visit
Admission: RE | Admit: 2016-10-01 | Discharge: 2016-10-01 | Disposition: A | Discharge status: Home / Self Care | Payer: 59 | Source: Ambulatory Visit | Attending: Family Medicine | Admitting: Family Medicine

## 2016-10-01 ENCOUNTER — Other Ambulatory Visit: Payer: Self-pay | Admitting: Family Medicine

## 2016-10-01 DIAGNOSIS — Z1231 Encounter for screening mammogram for malignant neoplasm of breast: Secondary | ICD-10-CM

## 2016-10-01 HISTORY — DX: Personal history of irradiation: Z92.3

## 2016-10-01 HISTORY — DX: Personal history of antineoplastic chemotherapy: Z92.21

## 2016-10-02 ENCOUNTER — Other Ambulatory Visit: Payer: Self-pay | Admitting: General Surgery

## 2016-10-02 ENCOUNTER — Ambulatory Visit
Admission: RE | Admit: 2016-10-02 | Discharge: 2016-10-02 | Disposition: A | Discharge status: Home / Self Care | Payer: 59 | Source: Ambulatory Visit | Attending: General Surgery | Admitting: General Surgery

## 2016-10-02 ENCOUNTER — Other Ambulatory Visit: Payer: Self-pay | Admitting: Family Medicine

## 2016-10-02 DIAGNOSIS — N6310 Unspecified lump in the right breast, unspecified quadrant: Secondary | ICD-10-CM

## 2016-10-02 DIAGNOSIS — N6489 Other specified disorders of breast: Secondary | ICD-10-CM

## 2016-10-02 DIAGNOSIS — R928 Other abnormal and inconclusive findings on diagnostic imaging of breast: Secondary | ICD-10-CM | POA: Diagnosis not present

## 2016-10-05 MED FILL — PANTOPRAZOLE SOD DR 40 MG T: 40 | 90 days supply | Qty: 90 | Fill #3

## 2016-10-08 MED FILL — traMADol HCL 50 MG TABS: 50 | 8 days supply | Qty: 60 | Fill #0

## 2016-10-08 MED FILL — HYDROCODON-APAP 5-325: 5-325 | 10 days supply | Qty: 40 | Fill #0

## 2016-10-15 MED FILL — GABAPENTIN 100 MG CAPSULE: 100 | 90 days supply | Qty: 360 | Fill #2

## 2016-11-07 DIAGNOSIS — K219 Gastro-esophageal reflux disease without esophagitis: Secondary | ICD-10-CM | POA: Diagnosis not present

## 2016-11-07 DIAGNOSIS — M1990 Unspecified osteoarthritis, unspecified site: Secondary | ICD-10-CM | POA: Diagnosis not present

## 2016-11-07 DIAGNOSIS — B009 Herpesviral infection, unspecified: Secondary | ICD-10-CM | POA: Diagnosis not present

## 2016-11-07 DIAGNOSIS — R609 Edema, unspecified: Secondary | ICD-10-CM | POA: Diagnosis not present

## 2016-11-07 DIAGNOSIS — N183 Chronic kidney disease, stage 3 (moderate): Secondary | ICD-10-CM | POA: Diagnosis not present

## 2016-11-07 DIAGNOSIS — R945 Abnormal results of liver function studies: Secondary | ICD-10-CM | POA: Diagnosis not present

## 2016-11-07 DIAGNOSIS — G620 Drug-induced polyneuropathy: Secondary | ICD-10-CM | POA: Diagnosis not present

## 2016-11-07 DIAGNOSIS — E782 Mixed hyperlipidemia: Secondary | ICD-10-CM | POA: Diagnosis not present

## 2016-11-07 MED FILL — traZODone HCL 50 MG TABS: 50 | 90 days supply | Qty: 270 | Fill #0

## 2016-11-07 MED FILL — valACYclovir HCL 1 GM TABS: 1 | 6 days supply | Qty: 24 | Fill #0

## 2016-11-07 MED FILL — VENLAFAXINE HCL ER 150 MG C: 150 | 90 days supply | Qty: 90 | Fill #0

## 2016-11-07 MED FILL — AMITRIPTYLINE HCL 25 MG TAB: 25 | 90 days supply | Qty: 180 | Fill #0

## 2016-11-08 ENCOUNTER — Other Ambulatory Visit: Payer: Self-pay | Admitting: Family Medicine

## 2016-11-08 DIAGNOSIS — R748 Abnormal levels of other serum enzymes: Secondary | ICD-10-CM

## 2016-11-09 ENCOUNTER — Ambulatory Visit
Admission: RE | Admit: 2016-11-09 | Discharge: 2016-11-09 | Disposition: A | Discharge status: Home / Self Care | Payer: 59 | Source: Ambulatory Visit | Attending: Family Medicine | Admitting: Family Medicine

## 2016-11-09 ENCOUNTER — Other Ambulatory Visit: Payer: 59

## 2016-11-09 DIAGNOSIS — R7989 Other specified abnormal findings of blood chemistry: Secondary | ICD-10-CM | POA: Diagnosis not present

## 2016-11-09 DIAGNOSIS — R748 Abnormal levels of other serum enzymes: Secondary | ICD-10-CM

## 2016-11-09 DIAGNOSIS — R7301 Impaired fasting glucose: Secondary | ICD-10-CM | POA: Diagnosis not present

## 2016-11-09 MED FILL — ROSUVASTATIN CALCIUM 20 MG: 20 | 90 days supply | Qty: 90 | Fill #0

## 2016-11-13 ENCOUNTER — Other Ambulatory Visit: Payer: 59

## 2016-11-21 DIAGNOSIS — N3946 Mixed incontinence: Secondary | ICD-10-CM | POA: Diagnosis not present

## 2016-11-21 DIAGNOSIS — Z8551 Personal history of malignant neoplasm of bladder: Secondary | ICD-10-CM | POA: Diagnosis not present

## 2016-12-04 MED FILL — MELOXICAM 15 MG TABLET: 15 | 90 days supply | Qty: 90 | Fill #1

## 2016-12-06 MED FILL — HYDROCODON-APAP 5-325: 5-325 | 10 days supply | Qty: 40 | Fill #0

## 2016-12-06 MED FILL — traMADol HCL 50 MG TABS: 50 | 8 days supply | Qty: 60 | Fill #0

## 2016-12-24 MED FILL — MYRBETRIQ ER 50 MG TABLET: 50 | 90 days supply | Qty: 90 | Fill #0

## 2016-12-26 ENCOUNTER — Ambulatory Visit (INDEPENDENT_AMBULATORY_CARE_PROVIDER_SITE_OTHER): Payer: 59 | Admitting: Neurology

## 2016-12-26 ENCOUNTER — Encounter: Payer: Self-pay | Admitting: Neurology

## 2016-12-26 DIAGNOSIS — G603 Idiopathic progressive neuropathy: Secondary | ICD-10-CM

## 2016-12-26 DIAGNOSIS — G629 Polyneuropathy, unspecified: Secondary | ICD-10-CM

## 2016-12-26 HISTORY — DX: Polyneuropathy, unspecified: G62.9

## 2016-12-26 NOTE — Progress Notes (Signed)
Reason for visit: Numbness in the feet  Referring physician: Dr. Erik Obey is a 56 y.o. female  History of present illness:  [name] is a 56 year old right-handed white female with a history of breast cancer treated in 2007. The patient underwent chemotherapy that included Taxol. The patient did not have any numbness during chemotherapy within several months after completion of chemotherapy she began having some numbness in the toes of the feet. The patient however has had ongoing progression of the numbness even to the present date. She has noted a spread of the numbness to include the distal half of the feet bilaterally, she denies any numbness up on the lower legs. The patient has not had any numbness in the hands, she denies any weakness or problems controlling the bowels or the bladder. She has not noted a severe change in balance. She denies any neck pain or back pain or pain down the arms or legs. She is noting some discomfort in the feet and has difficulty sleeping at night, but she also has some discomfort during the day too. She has been on Effexor and gabapentin and amitriptyline without full benefit. She only takes 200 mg gabapentin at night. She is sent to this office for further evaluation.  Past Medical History:  Diagnosis Date  . Cancer [facility]) 2007   hx rt breast cancer-bladder cancer  . GERD (gastroesophageal reflux disease)   . Hyperlipidemia   . Neuropathy   . Peripheral neuropathy 12/26/2016  . Personal history of chemotherapy   . Personal history of radiation therapy     Past Surgical History:  Procedure Laterality Date  . ABDOMINAL HYSTERECTOMY  2001   vag hyst  . BLADDER SUSPENSION  2009   cysto bladder cancer removed  . BREAST LUMPECTOMY Right 2007  . BREAST REDUCTION SURGERY  2004  . BREAST SURGERY  2007   rt lumpectomy-snbx  . CALCANEAL OSTEOTOMY  03/06/2012   Procedure: CALCANEAL OSTEOTOMY;  Surgeon: Wylene Simmer, MD;  Location: [place];  Service: Orthopedics;  Laterality: Right;  Right Gastroc Slide; Partial Tibia Tenolysis; Flexor Digitorum Longus Transfer to Navicular; Calcaneal Osteotomy  . CARPAL TUNNEL RELEASE  2007   left  . CYSTOSCOPY WITH BIOPSY    . GASTROCNEMIUS RECESSION  03/06/2012   Procedure: GASTROCNEMIUS SLIDE;  Surgeon: Wylene Simmer, MD;  Location: [place];  Service: Orthopedics;  Laterality: Right;  Right Gastroc Slide; Partial Tibia Tenolysis; Flexor Digitorum Longus Transfer to Navicular; Calcaneal Osteotomy    . OOPHORECTOMY    . PORT-A-CATH REMOVAL  2007   insertion then taken out  . REDUCTION MAMMAPLASTY Bilateral    20 plus years    Family History  Problem Relation Age of Onset  . Cancer Mother 17       Breast cancer  . Hypertension Mother   . Arthritis Mother   . Breast cancer Mother   . Hypertension Father   . Arthritis Father   . CAD Father   . Stroke Father   . Migraines Sister   . Cancer Maternal Aunt        Breast Cancer  . Breast cancer Maternal Aunt   . Cancer Paternal Aunt 74       Breast Cancer    Social history:  reports that she has never smoked. She has never used smokeless tobacco. She reports that she does not drink alcohol or use drugs.  Medications:  Prior to Admission medications  Medication Sig Start Date End Date Taking? Authorizing Provider  amitriptyline (ELAVIL) 25 MG tablet Take 25 mg by mouth daily. 11/07/16  Yes [provider]  Calcium-Vitamin D (CALTRATE 600 PLUS-VIT D PO) Take 1,200 mg by mouth daily.   Yes [provider]  cholecalciferol (VITAMIN D) 1000 UNITS tablet Take 1,000 Units by mouth daily.   Yes [provider]  gabapentin (NEURONTIN) 100 MG capsule TAKE 2 CAPSULES BY MOUTH TWICE DAILY FOR NEUROPATHY 10/15/16  Yes [provider]  HYDROcodone-acetaminophen (NORCO/VICODIN) 5-325 MG tablet TAKE 1 TABLET BY MOUTH EVERY 6 TO 8 HOURS AS NEEDED FOR PAIN 12/06/16  Yes [provider]  meloxicam (MOBIC) 15 MG tablet Take 15 mg by mouth daily.   Yes [provider]  MYRBETRIQ 50 MG TB24 tablet Take 50 mg by mouth daily. 12/24/16  Yes [provider]  pantoprazole (PROTONIX) 40 MG tablet TAKE 1 TABLET BY MOUTH ONCE DAILY FOR ACID REFLUX 10/05/16  Yes [provider]  rosuvastatin (CRESTOR) 20 MG tablet Take 20 mg by mouth daily.   Yes [provider]  traMADol (ULTRAM) 50 MG tablet Take 50 mg by mouth every 6 (six) hours as needed. for pain 12/06/16  Yes [provider]  traZODone (DESYREL) 50 MG tablet TAKE 1, 2, OR 3 TABLETS BY MOUTH AT BEDTIME FOR SLEEP DISTURBANCE 11/07/16  Yes [provider]  valACYclovir (VALTREX) 1000 MG tablet TAKE 2 TABLETS BY MOUTH TWICE A DAY FOR 4 DAYS WHEN COLD SORE BEGINS 11/07/16  Yes [provider]  venlafaxine XR (EFFEXOR-XR) 150 MG 24 hr capsule TAKE 1 CAPSULE BY MOUTH DAILY. 02/04/12  Yes Eston Esters, MD  ciprofloxacin (CIPRO) 500 MG tablet Take 500 mg by mouth 2 (two) times daily.    [provider]      Allergies  Allergen Reactions  . Septra [Sulfamethoxazole-Trimethoprim] Nausea And Vomiting    Very sick per patient    ROS:  Out of a complete 14 system review of symptoms, the patient complains only of the following symptoms, and all other reviewed systems are negative.  Ringing in the ears Foot numbness  Blood pressure 129/81, pulse 83, height 5\' 10"  (1.778 m), weight 279 lb (126.6 kg).  Physical Exam  General: The patient is alert and cooperative at the time of the examination. The patient is moderately to markedly obese.  Eyes: Pupils are equal, round, and reactive to light. Discs are flat bilaterally.  Neck: The neck is supple, no carotid bruits are noted.  Respiratory: The respiratory examination is clear.  Cardiovascular: The cardiovascular examination reveals a regular rate and rhythm, no obvious murmurs or rubs are noted.  Skin:  Extremities are without significant edema.  Neurologic Exam  Mental status: The patient is alert and oriented x 3 at the time of the examination. The patient has apparent normal recent and remote memory, with an apparently normal attention span and concentration ability.  Cranial nerves: Facial symmetry is present. There is good sensation of the face to pinprick and soft touch bilaterally. The strength of the facial muscles and the muscles to head turning and shoulder shrug are normal bilaterally. Speech is well enunciated, no aphasia or dysarthria is noted. Extraocular movements are full. Visual fields are full. The tongue is midline, and the patient has symmetric elevation of the soft palate. No obvious hearing deficits are noted.  Motor: The motor testing reveals 5 over 5 strength of all 4 extremities. Good symmetric motor tone is noted  throughout.  Sensory: Sensory testing is intact to pinprick, soft touch, vibration sensation, and position sense on the upper extremities. With the lower extremities there is a stocking pattern pinprick sensory deficit up to the knees bilaterally, minimal involvement of position sensation is noted in the feet, vibration sensation is symmetric. No evidence of extinction is noted.  Coordination: Cerebellar testing reveals good finger-nose-finger and heel-to-shin bilaterally.  Gait and station: Gait is normal. Tandem gait is slightly unsteady. Romberg is negative. No drift is seen.  Reflexes: Deep tendon reflexes are symmetric, but are depressed bilaterally. Toes are downgoing bilaterally.   Assessment/Plan:  1. Probable peripheral neuropathy  The patient indicates that she began to have some sensory changes within a few months after chemotherapy was stopped, certainly her chemotherapy regimen may have led to initial changes in her foot sensation. She claims to have ongoing progression of her sensory symptoms which would not likely be related to chemotherapy 11  years later. The patient will be set up for blood work today, she will have nerve conduction studies done on both legs and one arm and EMG study on one leg. She will follow-up for the above evaluation. The patient will go up on the gabapentin taking 200 mg twice daily, amitriptyline and Effexor should also help neuropathy pain. The gabapentin dose can be significantly increased if the patient tolerates the drug.  Jill Alexanders MD 12/26/2016 8:46 AM  [facility] [address] [place], [place] [address]  Phone [phone] Fax [phone]

## 2016-12-26 NOTE — Patient Instructions (Signed)
   Go up on the gabapentin to 200 mg twice a day as prescribed. We will get blood work today and get EMG and NCV to look at nerve function of the legs.

## 2017-01-01 LAB — LYME, WESTERN BLOT, SERUM (REFLEXED)
IGG P41 AB.: ABSENT
IGG P45 AB.: ABSENT
IgG P18 Ab.: ABSENT
IgG P28 Ab.: ABSENT
IgG P30 Ab.: ABSENT
IgG P66 Ab.: ABSENT
IgM P23 Ab.: ABSENT
IgM P39 Ab.: ABSENT
IgM P41 Ab.: ABSENT
LYME IGM WB: NEGATIVE
Lyme IgG Wb: NEGATIVE

## 2017-01-01 LAB — MULTIPLE MYELOMA PANEL, SERUM
Albumin SerPl Elph-Mcnc: 3.8 g/dL (ref 2.9–4.4)
Albumin/Glob SerPl: 1.3 (ref 0.7–1.7)
Alpha 1: 0.2 g/dL (ref 0.0–0.4)
Alpha2 Glob SerPl Elph-Mcnc: 0.9 g/dL (ref 0.4–1.0)
B-GLOBULIN SERPL ELPH-MCNC: 1 g/dL (ref 0.7–1.3)
Gamma Glob SerPl Elph-Mcnc: 1 g/dL (ref 0.4–1.8)
Globulin, Total: 3.1 g/dL (ref 2.2–3.9)
IGM (IMMUNOGLOBULIN M), SRM: 128 mg/dL (ref 26–217)
IgA/Immunoglobulin A, Serum: 130 mg/dL (ref 87–352)
IgG (Immunoglobin G), Serum: 794 mg/dL (ref 700–1600)
TOTAL PROTEIN: 6.9 g/dL (ref 6.0–8.5)

## 2017-01-01 LAB — SEDIMENTATION RATE: SED RATE: 5 mm/h (ref 0–40)

## 2017-01-01 LAB — ANA W/REFLEX: Anti Nuclear Antibody(ANA): NEGATIVE

## 2017-01-01 LAB — VITAMIN B12: VITAMIN B 12: 330 pg/mL (ref 232–1245)

## 2017-01-01 LAB — B. BURGDORFI ANTIBODIES: Lyme IgG/IgM Ab: 2.47 {ISR} — ABNORMAL HIGH (ref 0.00–0.90)

## 2017-01-01 LAB — ANGIOTENSIN CONVERTING ENZYME: ANGIO CONVERT ENZYME: 62 U/L (ref 14–82)

## 2017-01-04 MED FILL — PANTOPRAZOLE SOD DR 40 MG T: 40 | 90 days supply | Qty: 90 | Fill #0

## 2017-01-08 ENCOUNTER — Encounter: Payer: Self-pay | Admitting: Neurology

## 2017-01-08 ENCOUNTER — Ambulatory Visit (INDEPENDENT_AMBULATORY_CARE_PROVIDER_SITE_OTHER): Payer: 59 | Admitting: Neurology

## 2017-01-08 ENCOUNTER — Ambulatory Visit (INDEPENDENT_AMBULATORY_CARE_PROVIDER_SITE_OTHER): Payer: Self-pay | Admitting: Neurology

## 2017-01-08 DIAGNOSIS — G603 Idiopathic progressive neuropathy: Secondary | ICD-10-CM | POA: Diagnosis not present

## 2017-01-08 MED ORDER — GABAPENTIN 300 MG PO CAPS: ORAL_CAPSULE | ORAL | 3 refills | Status: DC

## 2017-01-08 MED FILL — GABAPENTIN 300 MG CAPSULE: 300 | 30 days supply | Qty: 90 | Fill #0

## 2017-01-08 NOTE — Procedures (Signed)
     HISTORY:  Barbara Trevino is a 56 year old patient with a history of numbness in the feet associated with chemotherapy 11 years ago, but the symptoms have progressed slowly with discomfort and burning in the feet. The patient is being evaluated for a possible neuropathy.  NERVE CONDUCTION STUDIES:  Nerve conduction studies were performed on both lower extremities. The distal motor latencies for the peroneal nerves were normal bilaterally with normal motor amplitudes seen for these nerves. The distal motor latencies for the posterior tibial nerves were prolonged on the left, normal on the right, with low motor amplitudes seen for these nerves bilaterally. The nerve conduction velocities for the peroneal and posterior tibial nerves revealed slight slowing for the left peroneal nerve above and below the knee, and below the knee only for the right peroneal nerve, normal above the knee. The nerve conduction velocities for the posterior tibial nerves were normal bilaterally. The sensory latencies for the peroneal nerves were normal bilaterally but there was absence of the medial and lateral plantar sensory latencies bilaterally. The H reflex latencies were unobtainable bilaterally.  EMG STUDIES:  EMG study was performed on the left lower extremity:  The tibialis anterior muscle reveals 2 to 4K motor units with full recruitment. No fibrillations or positive waves were seen. The peroneus tertius muscle reveals 2 to 4K motor units with slightly decreased recruitment. No fibrillations or positive waves were seen. The medial gastrocnemius muscle reveals 1 to 4K motor units with decreased recruitment. No fibrillations or positive waves were seen. The vastus lateralis muscle reveals 2 to 4K motor units with full recruitment. No fibrillations or positive waves were seen. The iliopsoas muscle reveals 2 to 4K motor units with full recruitment. No fibrillations or positive waves were seen. The biceps femoris  muscle (long head) reveals 2 to 4K motor units with full recruitment. No fibrillations or positive waves were seen. The lumbosacral paraspinal muscles were tested at 3 levels, and revealed no abnormalities of insertional activity at all 3 levels tested. There was good relaxation.   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 EMG evaluation of the left lower extremity did show mild stable chronic changes of denervation distally consistent with the diagnosis of peripheral neuropathy. There is no evidence of an overlying lumbosacral radiculopathy.  Jill Alexanders MD 01/08/2017 4:32 PM  [facility] [address] [place], [place] [address]  Phone [phone] Fax [phone]

## 2017-01-08 NOTE — Progress Notes (Addendum)
The patient comes in for EMG and nerve conduction study evaluation today. 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  The patient is having ongoing discomfort in the feet, she is on gabapentin taking 200 mg twice daily. She is tolerating the medication well.  We will go up on the gabapentin taking 300 mg twice daily for 7 days and then go up to 300 mg the morning and 600 mg in the evening.  She will follow-up in about 3-4 months.    Juab    Nerve / Sites Muscle Latency Ref. Amplitude Ref. Rel Amp Segments Distance Velocity Ref. Area    ms ms mV mV %  cm m/s m/s mVms  L Peroneal - EDB     Ankle EDB 3.3 ?6.5 3.5 ?2.0 100 Ankle - EDB 9   10.6     Fib head EDB 12.9  3.1  89.4 Fib head - Ankle 38 40 ?44 8.8     Pop fossa EDB 14.0  3.1  98.7 Pop fossa - Fib head 46 442 ?44 8.6         Pop fossa - Ankle      R Peroneal - EDB     Ankle EDB 4.5 ?6.5 3.3 ?2.0 100 Ankle - EDB 9   10.4     Fib head EDB 13.0  3.1  95.1 Fib head - Ankle 37 43 ?44 8.9     Pop fossa EDB 15.0  2.8  88.6 Pop fossa - Fib head 10 51 ?44 8.0         Pop fossa - Ankle      L Tibial - AH     Ankle AH 6.6 ?5.8 1.8 ?4.0 100 Ankle - AH 9   3.9     Pop fossa AH 17.9  0.9  52.2 Pop fossa - Ankle 46 41 ?41 2.7  R Tibial - AH     Ankle AH 5.6 ?5.8 0.3 ?4.0 100 Ankle - AH 9   0.9     Pop fossa AH 16.1  0.1  42.9 Pop fossa - Ankle 44 42 ?41 0.7             SNC    Nerve / Sites Rec. Site Peak Lat Ref.  Amp Ref. Segments Distance Peak Diff Ref.    ms ms V V  cm ms ms  L Superficial peroneal - Ankle     Lat leg Ankle 3.5 ?4.4 6 ?6 Lat leg - Ankle 14    R Superficial peroneal - Ankle     Lat leg Ankle 2.8 ?4.4 2 ?6 Lat leg - Ankle 14    R Medial plantar, Lateral plantar - Ankle (Medial, lateral sole)     Medial plantar Sole Ankle NR ?3.7 NR ?3 Medial plantar Sole - Ankle 14       Lateral plantar Sole Ankle NR ?3.7 NR ?3 Lateral plantar Sole - Ankle 14          Medial plantar Sole - Lateral plantar  Sole  NR ?0.0  L Medial plantar, Lateral plantar - Ankle (Medial, lateral sole)     Medial plantar Sole Ankle NR ?3.7 NR ?3 Medial plantar Sole - Ankle 14       Lateral plantar Sole Ankle NR ?3.7 NR ?3 Lateral plantar Sole - Ankle 14          Medial plantar Sole - Lateral plantar Sole  NR ?0.0  H Reflex    Nerve H Lat Lat Hmax   ms ms   Left Right Ref. Left Right Ref.  Tibial - Soleus NR NR ?35.0 NR NR ?35.0

## 2017-01-08 NOTE — Progress Notes (Signed)
Please refer EMG and nerve conduction study procedure note. 

## 2017-01-23 MED FILL — traMADol HCL 50 MG TABS: 50 | 8 days supply | Qty: 60 | Fill #0

## 2017-01-23 MED FILL — HYDROCODON-APAP 5-325: 5-325 | 10 days supply | Qty: 40 | Fill #0

## 2017-02-04 DIAGNOSIS — M1711 Unilateral primary osteoarthritis, right knee: Secondary | ICD-10-CM | POA: Diagnosis not present

## 2017-02-04 DIAGNOSIS — M1712 Unilateral primary osteoarthritis, left knee: Secondary | ICD-10-CM | POA: Diagnosis not present

## 2017-02-11 DIAGNOSIS — M1712 Unilateral primary osteoarthritis, left knee: Secondary | ICD-10-CM | POA: Diagnosis not present

## 2017-02-11 DIAGNOSIS — M1711 Unilateral primary osteoarthritis, right knee: Secondary | ICD-10-CM | POA: Diagnosis not present

## 2017-02-11 MED FILL — VENLAFAXINE HCL ER 150 MG C: 150 | 90 days supply | Qty: 90 | Fill #1

## 2017-02-11 MED FILL — GABAPENTIN 300 MG CAPSULE: 300 | 30 days supply | Qty: 90 | Fill #1

## 2017-02-11 MED FILL — AMITRIPTYLINE HCL 25 MG TAB: 25 | 90 days supply | Qty: 180 | Fill #1

## 2017-02-18 DIAGNOSIS — M1712 Unilateral primary osteoarthritis, left knee: Secondary | ICD-10-CM | POA: Diagnosis not present

## 2017-02-18 DIAGNOSIS — M1711 Unilateral primary osteoarthritis, right knee: Secondary | ICD-10-CM | POA: Diagnosis not present

## 2017-02-20 MED FILL — HYDROCODON-APAP 5-325: 5-325 | 10 days supply | Qty: 40 | Fill #0

## 2017-02-27 MED FILL — traMADol HCL 50 MG TABS: 50 | 8 days supply | Qty: 60 | Fill #0

## 2017-02-28 MED FILL — MELOXICAM 15 MG TABLET: 15 | 90 days supply | Qty: 90 | Fill #0

## 2017-03-20 ENCOUNTER — Ambulatory Visit: Payer: Self-pay

## 2017-03-20 ENCOUNTER — Other Ambulatory Visit: Payer: Self-pay | Admitting: Occupational Medicine

## 2017-03-20 DIAGNOSIS — M25522 Pain in left elbow: Secondary | ICD-10-CM

## 2017-04-01 MED FILL — ROSUVASTATIN CALCIUM 20 MG: 20 | 90 days supply | Qty: 90 | Fill #1

## 2017-04-01 MED FILL — PANTOPRAZOLE SOD DR 40 MG T: 40 | 90 days supply | Qty: 90 | Fill #1

## 2017-04-01 MED FILL — MYRBETRIQ ER 50 MG TABLET: 50 | 90 days supply | Qty: 90 | Fill #1

## 2017-04-01 MED FILL — GABAPENTIN 300 MG CAPSULE: 300 | 30 days supply | Qty: 90 | Fill #2

## 2017-04-01 MED FILL — traZODone HCL 50 MG TABS: 50 | 90 days supply | Qty: 270 | Fill #1

## 2017-04-04 ENCOUNTER — Other Ambulatory Visit: Payer: 59

## 2017-04-08 ENCOUNTER — Ambulatory Visit: Payer: Self-pay

## 2017-04-08 ENCOUNTER — Ambulatory Visit: Admission: RE | Admit: 2017-04-08 | Payer: 59 | Source: Ambulatory Visit

## 2017-04-08 ENCOUNTER — Ambulatory Visit
Admission: RE | Admit: 2017-04-08 | Discharge: 2017-04-08 | Disposition: A | Discharge status: Home / Self Care | Payer: 59 | Source: Ambulatory Visit | Attending: General Surgery | Admitting: General Surgery

## 2017-04-08 ENCOUNTER — Other Ambulatory Visit: Payer: Self-pay | Admitting: Occupational Medicine

## 2017-04-08 DIAGNOSIS — M25522 Pain in left elbow: Secondary | ICD-10-CM

## 2017-04-08 DIAGNOSIS — R928 Other abnormal and inconclusive findings on diagnostic imaging of breast: Secondary | ICD-10-CM | POA: Diagnosis not present

## 2017-04-08 DIAGNOSIS — N6489 Other specified disorders of breast: Secondary | ICD-10-CM

## 2017-04-11 ENCOUNTER — Encounter: Payer: Self-pay | Admitting: Neurology

## 2017-04-11 ENCOUNTER — Ambulatory Visit: Payer: 59 | Admitting: Neurology

## 2017-04-11 VITALS — BP 148/87 | HR 91 | Ht 70.0 in | Wt 288.0 lb

## 2017-04-11 DIAGNOSIS — G603 Idiopathic progressive neuropathy: Secondary | ICD-10-CM

## 2017-04-11 MED ORDER — GABAPENTIN 300 MG PO CAPS: 600.0000 mg | ORAL_CAPSULE | Freq: Two times a day (BID) | ORAL | 3 refills | Status: DC

## 2017-04-11 NOTE — Patient Instructions (Signed)
   We will go up on the gabapentin to 600 mg twice a day.

## 2017-04-11 NOTE — Progress Notes (Signed)
Reason for visit: Peripheral neuropathy  Barbara Trevino is an 57 y.o. female  History of present illness:  Barbara Trevino is a 57 year old right-handed white female with a history of breast cancer previously status post chemotherapy.  The patient developed numbness in the feet after chemotherapy stopped.  She has had some progression of her foot numbness over 11 years slowly.  The patient has been subjected to an EMG and nerve conduction study that did confirm the presence of a peripheral neuropathy.  She has been started on gabapentin for the discomfort.  She currently is on 300 mg in the morning and 600 mg in the evening.  She sleeps fairly well, she takes trazodone at night as well for sleep.  She may have some restless legs symptoms at times.  The patient still has some burning sensations in the feet, she is bothered by the numbness in the feet as well.  She returns to this office for an evaluation.  Past Medical History:  Diagnosis Date  . Cancer [facility]) 2007   hx rt breast cancer-bladder cancer  . GERD (gastroesophageal reflux disease)   . Hyperlipidemia   . Neuropathy   . Peripheral neuropathy 12/26/2016  . Personal history of chemotherapy   . Personal history of radiation therapy     Past Surgical History:  Procedure Laterality Date  . ABDOMINAL HYSTERECTOMY  2001   vag hyst  . BLADDER SUSPENSION  2009   cysto bladder cancer removed  . BREAST LUMPECTOMY Right 2007  . BREAST REDUCTION SURGERY  2004  . BREAST SURGERY  2007   rt lumpectomy-snbx  . CALCANEAL OSTEOTOMY  03/06/2012   Procedure: CALCANEAL OSTEOTOMY;  Surgeon: Wylene Simmer, MD;  Location: [place];  Service: Orthopedics;  Laterality: Right;  Right Gastroc Slide; Partial Tibia Tenolysis; Flexor Digitorum Longus Transfer to Navicular; Calcaneal Osteotomy  . CARPAL TUNNEL RELEASE  2007   left  . CYSTOSCOPY WITH BIOPSY    . GASTROCNEMIUS RECESSION  03/06/2012   Procedure: GASTROCNEMIUS SLIDE;  Surgeon:  Wylene Simmer, MD;  Location: [place];  Service: Orthopedics;  Laterality: Right;  Right Gastroc Slide; Partial Tibia Tenolysis; Flexor Digitorum Longus Transfer to Navicular; Calcaneal Osteotomy    . OOPHORECTOMY    . PORT-A-CATH REMOVAL  2007   insertion then taken out  . REDUCTION MAMMAPLASTY Bilateral    20 plus years    Family History  Problem Relation Age of Onset  . Cancer Mother 64       Breast cancer  . Hypertension Mother   . Arthritis Mother   . Breast cancer Mother   . Hypertension Father   . Arthritis Father   . CAD Father   . Stroke Father   . Migraines Sister   . Cancer Maternal Aunt        Breast Cancer  . Breast cancer Maternal Aunt   . Cancer Paternal Aunt 17       Breast Cancer    Social history:  reports that  has never smoked. she has never used smokeless tobacco. She reports that she does not drink alcohol or use drugs.    Allergies  Allergen Reactions  . Septra [Sulfamethoxazole-Trimethoprim] Nausea And Vomiting    Very sick per patient    Medications:  Prior to Admission medications   Medication Sig Start Date End Date Taking? Authorizing Provider  amitriptyline (ELAVIL) 25 MG tablet Take 25 mg by mouth daily. 11/07/16  Yes [provider]  Calcium-Vitamin D (CALTRATE 600 PLUS-VIT D PO) Take 1,200 mg by mouth daily.   Yes [provider]  cholecalciferol (VITAMIN D) 1000 UNITS tablet Take 1,000 Units by mouth daily.   Yes [provider]  ciprofloxacin (CIPRO) 500 MG tablet Take 500 mg by mouth as needed.    Yes [provider]  gabapentin (NEURONTIN) 300 MG capsule One capsule in the morning and 2 in the evening 01/08/17  Yes Kathrynn Ducking, MD  HYDROcodone-acetaminophen (NORCO/VICODIN) 5-325 MG tablet TAKE 1 TABLET BY MOUTH EVERY 6 TO 8 HOURS AS NEEDED FOR PAIN 12/06/16  Yes [provider]  meloxicam (MOBIC) 15 MG tablet Take 15 mg by mouth daily.   Yes [provider]    MYRBETRIQ 50 MG TB24 tablet Take 50 mg by mouth daily. 12/24/16  Yes [provider]  pantoprazole (PROTONIX) 40 MG tablet TAKE 1 TABLET BY MOUTH ONCE DAILY FOR ACID REFLUX 10/05/16  Yes [provider]  rosuvastatin (CRESTOR) 20 MG tablet Take 20 mg by mouth daily.   Yes [provider]  traMADol (ULTRAM) 50 MG tablet Take 50 mg by mouth every 6 (six) hours as needed. for pain 12/06/16  Yes [provider]  traZODone (DESYREL) 50 MG tablet TAKE 1, 2, OR 3 TABLETS BY MOUTH AT BEDTIME FOR SLEEP DISTURBANCE 11/07/16  Yes [provider]  valACYclovir (VALTREX) 1000 MG tablet TAKE 2 TABLETS BY MOUTH TWICE A DAY FOR 4 DAYS WHEN COLD SORE BEGINS as needed 11/07/16  Yes [provider]  venlafaxine XR (EFFEXOR-XR) 150 MG 24 hr capsule TAKE 1 CAPSULE BY MOUTH DAILY. 02/04/12  Yes Eston Esters, MD    ROS:  Out of a complete 14 system review of symptoms, the patient complains only of the following symptoms, and all other reviewed systems are negative.  Numbness  Blood pressure (!) 148/87, pulse 91, height 5\' 10"  (1.778 m), weight 288 lb (130.6 kg).  Physical Exam  General: The patient is alert and cooperative at the time of the examination.  The patient is moderately obese.  Skin: No significant peripheral edema is noted.   Neurologic Exam  Mental status: The patient is alert and oriented x 3 at the time of the examination. The patient has apparent normal recent and remote memory, with an apparently normal attention span and concentration ability.   Cranial nerves: Facial symmetry is present. Speech is normal, no aphasia or dysarthria is noted. Extraocular movements are full. Visual fields are full.  Motor: The patient has good strength in all 4 extremities.  Sensory examination: Soft touch sensation is symmetric on the face, arms, and legs.  Coordination: The patient has good finger-nose-finger and heel-to-shin bilaterally.  Gait and station:  The patient has a normal gait. Tandem gait is slightly unsteady. Romberg is negative. No drift is seen.  The patient is able to walk on heels and the toes bilaterally.  Reflexes: Deep tendon reflexes are symmetric.   Assessment/Plan:  1.  Peripheral neuropathy  The patient will be increased on the gabapentin taking 600 mg twice daily.  A prescription was sent in for the medication.  The patient will follow-up in 6 months, sooner if needed.  She is to call if any dose adjustments are required.  Jill Alexanders MD 04/11/2017 3:06 PM  [facility] [address] [place], [place] [address]  Phone [phone] Fax [phone]

## 2017-04-12 MED FILL — traMADol HCL 50 MG TABS: 50 | 8 days supply | Qty: 60 | Fill #0

## 2017-04-12 MED FILL — HYDROCODON-APAP 5-325: 5-325 | 10 days supply | Qty: 40 | Fill #0

## 2017-04-30 MED FILL — valACYclovir HCL 1 GM TABS: 1 | 6 days supply | Qty: 24 | Fill #1

## 2017-05-13 MED FILL — AMITRIPTYLINE HCL 25 MG TAB: 25 | 90 days supply | Qty: 180 | Fill #2

## 2017-05-13 MED FILL — GABAPENTIN 300 MG CAPSULE: 300 | 90 days supply | Qty: 360 | Fill #0

## 2017-05-13 MED FILL — MELOXICAM 15 MG TABLET: 15 | 90 days supply | Qty: 90 | Fill #1

## 2017-05-13 MED FILL — VENLAFAXINE HCL ER 150 MG C: 150 | 90 days supply | Qty: 90 | Fill #2

## 2017-05-15 MED FILL — traMADol HCL 50 MG TABS: 50 | 8 days supply | Qty: 60 | Fill #0

## 2017-05-15 MED FILL — HYDROCODON-APAP 5-325: 5-325 | 10 days supply | Qty: 40 | Fill #0

## 2017-06-28 MED FILL — PANTOPRAZOLE SOD DR 40 MG T: 40 | 90 days supply | Qty: 90 | Fill #2

## 2017-06-28 MED FILL — ROSUVASTATIN CALCIUM 20 MG: 20 | 90 days supply | Qty: 90 | Fill #2

## 2017-06-28 MED FILL — MYRBETRIQ ER 50 MG TABLET: 50 | 90 days supply | Qty: 90 | Fill #2

## 2017-07-03 DIAGNOSIS — M79672 Pain in left foot: Secondary | ICD-10-CM | POA: Diagnosis not present

## 2017-07-03 MED FILL — traMADol HCL 50 MG TABS: 50 | 8 days supply | Qty: 60 | Fill #0

## 2017-07-03 MED FILL — HYDROCODON-APAP 5-325: 5-325 | 10 days supply | Qty: 40 | Fill #0

## 2017-08-15 MED FILL — traMADol HCL 50 MG TABS: 50 | 7 days supply | Qty: 60 | Fill #0

## 2017-08-15 MED FILL — HYDROCODON-APAP 5-325: 5-325 | 10 days supply | Qty: 40 | Fill #0

## 2017-08-23 MED FILL — AMITRIPTYLINE HCL 25 MG TAB: 25 | 90 days supply | Qty: 180 | Fill #3

## 2017-08-23 MED FILL — VENLAFAXINE HCL ER 150 MG C: 150 | 90 days supply | Qty: 90 | Fill #3

## 2017-08-23 MED FILL — GABAPENTIN 300 MG CAPSULE: 300 | 90 days supply | Qty: 360 | Fill #1

## 2017-08-29 ENCOUNTER — Other Ambulatory Visit: Payer: Self-pay

## 2017-08-29 ENCOUNTER — Ambulatory Visit: Payer: 59 | Attending: Family Medicine

## 2017-08-29 DIAGNOSIS — M25572 Pain in left ankle and joints of left foot: Secondary | ICD-10-CM | POA: Insufficient documentation

## 2017-08-29 NOTE — Therapy (Signed)
[place], [place], [address] Phone: [phone]   Fax:  [phone]  Physical Therapy Evaluation  Patient Details  Name: Barbara Trevino MRN: [id number] Date of Birth: [birth year] Referring Provider: Melrose Nakayama MD   Encounter Date: 08/29/2017  PT End of Session - 08/29/17 1324    Visit Number  1    Number of Visits  12    Date for PT Re-Evaluation  10/04/17    PT Start Time  0120    PT Stop Time  0205    PT Time Calculation (min)  45 min    Activity Tolerance  Patient tolerated treatment well    Behavior During Therapy  [facility] for tasks assessed/performed       Past Medical History:  Diagnosis Date  . Cancer [facility]) 2007   hx rt breast cancer-bladder cancer  . GERD (gastroesophageal reflux disease)   . Hyperlipidemia   . Neuropathy   . Peripheral neuropathy 12/26/2016  . Personal history of chemotherapy   . Personal history of radiation therapy     Past Surgical History:  Procedure Laterality Date  . ABDOMINAL HYSTERECTOMY  2001   vag hyst  . BLADDER SUSPENSION  2009   cysto bladder cancer removed  . BREAST LUMPECTOMY Right 2007  . BREAST REDUCTION SURGERY  2004  . BREAST SURGERY  2007   rt lumpectomy-snbx  . CALCANEAL OSTEOTOMY  03/06/2012   Procedure: CALCANEAL OSTEOTOMY;  Surgeon: Wylene Simmer, MD;  Location: [place];  Service: Orthopedics;  Laterality: Right;  Right Gastroc Slide; Partial Tibia Tenolysis; Flexor Digitorum Longus Transfer to Navicular; Calcaneal Osteotomy  . CARPAL TUNNEL RELEASE  2007   left  . CYSTOSCOPY WITH BIOPSY    . GASTROCNEMIUS RECESSION  03/06/2012   Procedure: GASTROCNEMIUS SLIDE;  Surgeon: Wylene Simmer, MD;  Location: [place];  Service: Orthopedics;  Laterality: Right;  Right Gastroc Slide; Partial Tibia Tenolysis; Flexor Digitorum Longus Transfer to Navicular; Calcaneal Osteotomy    . OOPHORECTOMY    . PORT-A-CATH REMOVAL  2007    insertion then taken out  . REDUCTION MAMMAPLASTY Bilateral    20 plus years    There were no vitals filed for this visit.   Subjective Assessment - 08/29/17 1325    Subjective  She reports achilles tendonitis.  SPur in same area. 2 months of pain. Varies by day with pain.    Md did not do anything except refrred to PT. Limits walking..  Stiffens and sore after sitting.     Pertinent History  Rt calcanel osteotomy.    Limitations  -- Can do all activity but with pain.     Diagnostic tests  Xray    Patient Stated Goals  She wants to have less pain.     Currently in Pain?  No/denies    Pain Score  -- 8/10 on walking then eases after that    Pain Location  Ankle    Pain Orientation  Left;Lateral;Posterior    Pain Descriptors / Indicators  Sharp;Sore    Pain Type  Chronic pain    Pain Onset  More than a month ago    Pain Frequency  Intermittent    Aggravating Factors   standing and walking    Pain Relieving Factors  sitting,          [facility] PT Assessment - 08/29/17 0001      Assessment   Medical Diagnosis  achilles tendonitis  Referring Provider  Melrose Nakayama MD    Onset Date/Surgical Date  -- 2 months ago started    Next MD Visit  As needed    Prior Therapy  No for this issue      Precautions   Precautions  None      Restrictions   Weight Bearing Restrictions  No      Balance Screen   Has the patient fallen in the past 6 months  No      Prior Function   Level of Independence  Independent      Cognition   Overall Cognitive Status  Within Functional Limits for tasks assessed      Posture/Postural Control   Posture Comments  mild pronation bilateral      ROM / Strength   AROM / PROM / Strength  AROM;PROM;Strength      AROM   AROM Assessment Site  Ankle    Right/Left Ankle  Right;Left    Right Ankle Dorsiflexion  103    Right Ankle Plantar Flexion  50    Right Ankle Inversion  33    Right Ankle Eversion  20    Left Ankle Dorsiflexion  105    Left Ankle  Plantar Flexion  60    Left Ankle Inversion  35    Left Ankle Eversion  22      Strength   Strength Assessment Site  Ankle    Right/Left Ankle  Right;Left    Right Ankle Dorsiflexion  5/5    Right Ankle Plantar Flexion  5/5    Right Ankle Inversion  5/5    Right Ankle Eversion  5/5    Left Ankle Dorsiflexion  5/5    Left Ankle Plantar Flexion  5/5    Left Ankle Inversion  5/5    Left Ankle Eversion  5/5 mild pain lateal malleolus      Flexibility   Soft Tissue Assessment /Muscle Length  yes                Objective measurements completed on examination: See above findings.              PT Education - 08/29/17 1416    Education Details  POC   DF stretch 30 sec 2-3 reps 2x/day PF reed band 10 reps 2x/day    Person(s) Educated  Patient    Methods  Explanation;Demonstration;Verbal cues;Handout    Comprehension  Returned demonstration;Verbalized understanding       PT Short Term Goals - 08/29/17 1410      PT SHORT TERM GOAL #1   Title  She will be incepencent with initial HEP    Time  3    Period  Weeks    Status  New      PT SHORT TERM GOAL #2   Title  She will report pain decr 30% or more with walking    Time  3    Period  Weeks    Status  New        PT Long Term Goals - 08/29/17 1411      PT LONG TERM GOAL #1   Title  She will be independent with all hEP issued    Time  6    Period  Weeks    Status  New      PT LONG TERM GOAL #2   Title  She will report pain as intermittant with activity on feet.     Time  6    Period  Weeks    Status  New      PT LONG TERM GOAL #3   Title  She will be able to sit for a period and rise with pain increased 0-2/10 max    Time  6    Period  Weeks    Status  New             Plan - 08/29/17 1412    Clinical Impression Statement  Ms Mccullers presents with LT lateal ankle and heel cord insertion pain. This is worse with activity on feet. No specific incident caused pain.  Her ROM is normal and  strength is normal.  She has some valgus at knees.   She should improve with PT     Clinical Presentation  Stable    Clinical Decision Making  Low    Rehab Potential  Good    PT Frequency  2x / week    PT Duration  6 weeks    PT Treatment/Interventions  Cryotherapy;Iontophoresis 4mg /ml Dexamethasone;Ultrasound;Patient/family education;Therapeutic exercise;Manual techniques;Taping    PT Next Visit Plan  Review stretch and PF band exer.  modalities and manual for pain    PT Home Exercise Plan  Pf red band and strap DF stretch    Consulted and Agree with Plan of Care  Patient       Patient will benefit from skilled therapeutic intervention in order to improve the following deficits and impairments:  Pain, Postural dysfunction, Decreased activity tolerance, Difficulty walking  Visit Diagnosis: Pain in left ankle and joints of left foot     Problem List Patient Active Problem List   Diagnosis Date Noted  . Peripheral neuropathy 12/26/2016  . History of breast cancer in female 08/19/2012  . FOOT PAIN 06/10/2009  . PES PLANUS 06/10/2009    Darrel Hoover  PT 08/29/2017, 2:17 PM  [facility] [address] [place], [place], [address] Phone: [phone]   Fax:  [phone]  Name: MEOSHA CASTANON MRN: [id number] Date of Birth: 1960/07/29

## 2017-09-02 MED FILL — MELOXICAM 15 MG TABLET: 15 | 60 days supply | Qty: 60 | Fill #0

## 2017-09-10 ENCOUNTER — Ambulatory Visit: Payer: 59 | Attending: Family Medicine

## 2017-09-10 DIAGNOSIS — M25572 Pain in left ankle and joints of left foot: Secondary | ICD-10-CM | POA: Diagnosis not present

## 2017-09-10 NOTE — Therapy (Signed)
[place], [place], [address] Phone: [phone]   Fax:  [phone]  Physical Therapy Treatment  Patient Details  Name: Barbara Trevino MRN: [id number] Date of Birth: 1960-05-01 Referring Provider: Melrose Nakayama MD   Encounter Date: 09/10/2017  PT End of Session - 09/10/17 1625    Visit Number  2    Number of Visits  12    Date for PT Re-Evaluation  10/04/17    PT Start Time  0425    PT Stop Time  0505    PT Time Calculation (min)  40 min    Activity Tolerance  Patient tolerated treatment well    Behavior During Therapy  [facility] for tasks assessed/performed       Past Medical History:  Diagnosis Date  . Cancer [facility]) 2007   hx rt breast cancer-bladder cancer  . GERD (gastroesophageal reflux disease)   . Hyperlipidemia   . Neuropathy   . Peripheral neuropathy 12/26/2016  . Personal history of chemotherapy   . Personal history of radiation therapy     Past Surgical History:  Procedure Laterality Date  . ABDOMINAL HYSTERECTOMY  2001   vag hyst  . BLADDER SUSPENSION  2009   cysto bladder cancer removed  . BREAST LUMPECTOMY Right 2007  . BREAST REDUCTION SURGERY  2004  . BREAST SURGERY  2007   rt lumpectomy-snbx  . CALCANEAL OSTEOTOMY  03/06/2012   Procedure: CALCANEAL OSTEOTOMY;  Surgeon: Wylene Simmer, MD;  Location: [place];  Service: Orthopedics;  Laterality: Right;  Right Gastroc Slide; Partial Tibia Tenolysis; Flexor Digitorum Longus Transfer to Navicular; Calcaneal Osteotomy  . CARPAL TUNNEL RELEASE  2007   left  . CYSTOSCOPY WITH BIOPSY    . GASTROCNEMIUS RECESSION  03/06/2012   Procedure: GASTROCNEMIUS SLIDE;  Surgeon: Wylene Simmer, MD;  Location: [place];  Service: Orthopedics;  Laterality: Right;  Right Gastroc Slide; Partial Tibia Tenolysis; Flexor Digitorum Longus Transfer to Navicular; Calcaneal Osteotomy    . OOPHORECTOMY    . PORT-A-CATH REMOVAL  2007    insertion then taken out  . REDUCTION MAMMAPLASTY Bilateral    20 plus years    There were no vitals filed for this visit.  Subjective Assessment - 09/10/17 1631    Subjective  killing me. Stretching incr pain(so asked her to ease on stretching tension)  (Pended)     Currently in Pain?  Yes  (Pended)     Pain Score  5   (Pended)     Pain Location  Heel  (Pended)     Pain Orientation  Left  (Pended)     Pain Descriptors / Indicators  Sharp;Sore  (Pended)     Pain Type  Chronic pain  (Pended)     Pain Onset  More than a month ago  (Pended)     Pain Frequency  Intermittent  (Pended)                        [facility] Adult PT Treatment/Exercise - 09/10/17 0001      Exercises   Exercises  Ankle;Other Exercises    Other Exercises   She was independent with initial HEP      Modalities   Modalities  Ultrasound;Iontophoresis      Ultrasound   Ultrasound Location  LT heel cord insertioln and latreral foot    Ultrasound Parameters  100% 1.5 Wcm2 1 MHz    Ultrasound Goals  Pain      Iontophoresis   Type of Iontophoresis  Dexamethasone    Location  LT heel cord insertion    Dose  1cc    Time  4-6 hours      Manual Therapy   Manual Therapy  Soft tissue mobilization    Soft tissue mobilization  manally and with tool calf and heel cors and lateral foot.              PT Education - 09/10/17 1659    Education Details  ionto patch management    Person(s) Educated  Patient    Methods  Explanation    Comprehension  Verbalized understanding       PT Short Term Goals - 08/29/17 1410      PT SHORT TERM GOAL #1   Title  She will be incepencent with initial HEP    Time  3    Period  Weeks    Status  New      PT SHORT TERM GOAL #2   Title  She will report pain decr 30% or more with walking    Time  3    Period  Weeks    Status  New        PT Long Term Goals - 08/29/17 1411      PT LONG TERM GOAL #1   Title  She will be independent with all hEP issued     Time  6    Period  Weeks    Status  New      PT LONG TERM GOAL #2   Title  She will report pain as intermittant with activity on feet.     Time  6    Period  Weeks    Status  New      PT LONG TERM GOAL #3   Title  She will be able to sit for a period and rise with pain increased 0-2/10 max    Time  6    Period  Weeks    Status  New            Plan - 09/10/17 1625    Clinical Impression Statement  Some better post session . Cautioned about wear time with ionto patch.    Generally no change with symptoms so far. She is able to do HEP correctly    PT Treatment/Interventions  Cryotherapy;Iontophoresis 4mg /ml Dexamethasone;Ultrasound;Patient/family education;Therapeutic exercise;Manual techniques;Taping    PT Next Visit Plan  .  modalities and manual for pain    PT Home Exercise Plan  Pf red band and strap DF stretch    Consulted and Agree with Plan of Care  Patient       Patient will benefit from skilled therapeutic intervention in order to improve the following deficits and impairments:  Pain, Postural dysfunction, Decreased activity tolerance, Difficulty walking  Visit Diagnosis: Pain in left ankle and joints of left foot     Problem List Patient Active Problem List   Diagnosis Date Noted  . Peripheral neuropathy 12/26/2016  . History of breast cancer in female 08/19/2012  . FOOT PAIN 06/10/2009  . PES PLANUS 06/10/2009    Darrel Hoover  PT 09/10/2017, 5:08 PM  [facility] [address] [place], [place], [address] Phone: [phone]   Fax:  [phone]  Name: Barbara Trevino MRN: [id number] Date of Birth: 1960/11/17

## 2017-09-10 NOTE — Patient Instructions (Signed)

## 2017-09-11 ENCOUNTER — Ambulatory Visit: Payer: 59 | Admitting: Physical Therapy

## 2017-09-17 ENCOUNTER — Ambulatory Visit: Payer: 59

## 2017-09-17 DIAGNOSIS — B009 Herpesviral infection, unspecified: Secondary | ICD-10-CM | POA: Diagnosis not present

## 2017-09-17 DIAGNOSIS — R7301 Impaired fasting glucose: Secondary | ICD-10-CM | POA: Diagnosis not present

## 2017-09-17 DIAGNOSIS — M25572 Pain in left ankle and joints of left foot: Secondary | ICD-10-CM

## 2017-09-17 DIAGNOSIS — K219 Gastro-esophageal reflux disease without esophagitis: Secondary | ICD-10-CM | POA: Diagnosis not present

## 2017-09-17 DIAGNOSIS — G620 Drug-induced polyneuropathy: Secondary | ICD-10-CM | POA: Diagnosis not present

## 2017-09-17 DIAGNOSIS — R609 Edema, unspecified: Secondary | ICD-10-CM | POA: Diagnosis not present

## 2017-09-17 DIAGNOSIS — Z0000 Encounter for general adult medical examination without abnormal findings: Secondary | ICD-10-CM | POA: Diagnosis not present

## 2017-09-17 DIAGNOSIS — M1990 Unspecified osteoarthritis, unspecified site: Secondary | ICD-10-CM | POA: Diagnosis not present

## 2017-09-17 DIAGNOSIS — E782 Mixed hyperlipidemia: Secondary | ICD-10-CM | POA: Diagnosis not present

## 2017-09-17 DIAGNOSIS — N183 Chronic kidney disease, stage 3 (moderate): Secondary | ICD-10-CM | POA: Diagnosis not present

## 2017-09-17 MED FILL — ROSUVASTATIN CALCIUM 20 MG: 20 | 90 days supply | Qty: 90 | Fill #0

## 2017-09-17 MED FILL — traZODone HCL 50 MG TABS: 50 | 90 days supply | Qty: 270 | Fill #0

## 2017-09-17 MED FILL — PANTOPRAZOLE SOD DR 40 MG T: 40 | 90 days supply | Qty: 90 | Fill #0

## 2017-09-17 MED FILL — valACYclovir HCL 1 GM TABS: 1 | 6 days supply | Qty: 24 | Fill #0

## 2017-09-17 NOTE — Therapy (Signed)
[place], [place], [address] Phone: [phone]   Fax:  [phone]  Physical Therapy Treatment  Patient Details  Name: Barbara Trevino MRN: [id number] Date of Birth: [birth year] Referring Provider: Melrose Nakayama MD   Encounter Date: 09/17/2017  PT End of Session - 09/17/17 1625    Visit Number  3    Number of Visits  12    Date for PT Re-Evaluation  10/04/17    PT Start Time  0422    PT Stop Time  0500    PT Time Calculation (min)  38 min    Activity Tolerance  Patient tolerated treatment well    Behavior During Therapy  [facility] for tasks assessed/performed       Past Medical History:  Diagnosis Date  . Cancer [facility]) 2007   hx rt breast cancer-bladder cancer  . GERD (gastroesophageal reflux disease)   . Hyperlipidemia   . Neuropathy   . Peripheral neuropathy 12/26/2016  . Personal history of chemotherapy   . Personal history of radiation therapy     Past Surgical History:  Procedure Laterality Date  . ABDOMINAL HYSTERECTOMY  2001   vag hyst  . BLADDER SUSPENSION  2009   cysto bladder cancer removed  . BREAST LUMPECTOMY Right 2007  . BREAST REDUCTION SURGERY  2004  . BREAST SURGERY  2007   rt lumpectomy-snbx  . CALCANEAL OSTEOTOMY  03/06/2012   Procedure: CALCANEAL OSTEOTOMY;  Surgeon: Wylene Simmer, MD;  Location: [place];  Service: Orthopedics;  Laterality: Right;  Right Gastroc Slide; Partial Tibia Tenolysis; Flexor Digitorum Longus Transfer to Navicular; Calcaneal Osteotomy  . CARPAL TUNNEL RELEASE  2007   left  . CYSTOSCOPY WITH BIOPSY    . GASTROCNEMIUS RECESSION  03/06/2012   Procedure: GASTROCNEMIUS SLIDE;  Surgeon: Wylene Simmer, MD;  Location: [place];  Service: Orthopedics;  Laterality: Right;  Right Gastroc Slide; Partial Tibia Tenolysis; Flexor Digitorum Longus Transfer to Navicular; Calcaneal Osteotomy    . OOPHORECTOMY    . PORT-A-CATH REMOVAL  2007    insertion then taken out  . REDUCTION MAMMAPLASTY Bilateral    20 plus years    There were no vitals filed for this visit.  Subjective Assessment - 09/17/17 1625    Subjective  no change . Ionto she did not feel anything. She reported iincreased pain on leaving MD office this AM and did not do anyuthing to it there. She also report able to move foot around and decr pain.     Pain Location  Ankle    Pain Orientation  Left;Lateral;Posterior    Pain Descriptors / Indicators  Sharp;Sore    Pain Type  Chronic pain    Pain Onset  More than a month ago    Pain Frequency  Intermittent                       [facility] Adult PT Treatment/Exercise - 09/17/17 0001      Ultrasound   Ultrasound Location  LT heel cord and lateral foot moving prox alonf tendon    Ultrasound Parameters  100% 1.5 Wcm2 , 50%    Ultrasound Goals  Pain      Iontophoresis   Type of Iontophoresis  Dexamethasone    Location  LT heel cord 1 inch distal from insertion.     Dose  1cc    Time  4-6 hours      Manual Therapy  Soft tissue mobilization  manally and with tool calf and heel cors and lateral foot.       Ankle Exercises: Stretches   Slant Board Stretch  2 reps;60 seconds               PT Short Term Goals - 09/17/17 1706      PT SHORT TERM GOAL #1   Title  She will be independent with initial HEP    Status  On-going      PT SHORT TERM GOAL #2   Title  She will report pain decr 30% or more with walking    Status  On-going        PT Long Term Goals - 08/29/17 1411      PT LONG TERM GOAL #1   Title  She will be independent with all hEP issued    Time  6    Period  Weeks    Status  New      PT LONG TERM GOAL #2   Title  She will report pain as intermittant with activity on feet.     Time  6    Period  Weeks    Status  New      PT LONG TERM GOAL #3   Title  She will be able to sit for a period and rise with pain increased 0-2/10 max    Time  6    Period  Weeks     Status  New            Plan - 09/17/17 1625    Clinical Impression Statement  She reports feeling better after session and wanted to try ionto again .   She is frustrated that the pain has lasted so long.    She was tender mid calf and mildly alteral heel cord about 1 inch up fro insertion.      PT Treatment/Interventions  Cryotherapy;Iontophoresis 4mg /ml Dexamethasone;Ultrasound;Patient/family education;Therapeutic exercise;Manual techniques;Taping    PT Next Visit Plan  .  modalities and manual for pain    PT Home Exercise Plan  Pf red band and strap DF stretch    Consulted and Agree with Plan of Care  Patient       Patient will benefit from skilled therapeutic intervention in order to improve the following deficits and impairments:  Pain, Postural dysfunction, Decreased activity tolerance, Difficulty walking  Visit Diagnosis: Pain in left ankle and joints of left foot     Problem List Patient Active Problem List   Diagnosis Date Noted  . Peripheral neuropathy 12/26/2016  . History of breast cancer in female 08/19/2012  . FOOT PAIN 06/10/2009  . PES PLANUS 06/10/2009    Darrel Hoover  PT 09/17/2017, 5:07 PM  [facility] [address] [place], [place], [address] Phone: [phone]   Fax:  [phone]  Name: Barbara Trevino MRN: [id number] Date of Birth: Aug 28, 1960

## 2017-09-19 ENCOUNTER — Ambulatory Visit: Payer: 59

## 2017-09-19 DIAGNOSIS — M25572 Pain in left ankle and joints of left foot: Secondary | ICD-10-CM

## 2017-09-19 NOTE — Therapy (Signed)
[place], [place], [address] Phone: [phone]   Fax:  [phone]  Physical Therapy Treatment  Patient Details  Name: Barbara Trevino MRN: [id number] Date of Birth: December 25, 1960 Referring Provider: Melrose Nakayama MD   Encounter Date: 09/19/2017  PT End of Session - 09/19/17 1626    Visit Number  4    Number of Visits  12    Date for PT Re-Evaluation  10/04/17    PT Start Time  0425    PT Stop Time  0503    PT Time Calculation (min)  38 min    Activity Tolerance  Patient tolerated treatment well    Behavior During Therapy  [facility] for tasks assessed/performed       Past Medical History:  Diagnosis Date  . Cancer [facility]) 2007   hx rt breast cancer-bladder cancer  . GERD (gastroesophageal reflux disease)   . Hyperlipidemia   . Neuropathy   . Peripheral neuropathy 12/26/2016  . Personal history of chemotherapy   . Personal history of radiation therapy     Past Surgical History:  Procedure Laterality Date  . ABDOMINAL HYSTERECTOMY  2001   vag hyst  . BLADDER SUSPENSION  2009   cysto bladder cancer removed  . BREAST LUMPECTOMY Right 2007  . BREAST REDUCTION SURGERY  2004  . BREAST SURGERY  2007   rt lumpectomy-snbx  . CALCANEAL OSTEOTOMY  03/06/2012   Procedure: CALCANEAL OSTEOTOMY;  Surgeon: Wylene Simmer, MD;  Location: [place];  Service: Orthopedics;  Laterality: Right;  Right Gastroc Slide; Partial Tibia Tenolysis; Flexor Digitorum Longus Transfer to Navicular; Calcaneal Osteotomy  . CARPAL TUNNEL RELEASE  2007   left  . CYSTOSCOPY WITH BIOPSY    . GASTROCNEMIUS RECESSION  03/06/2012   Procedure: GASTROCNEMIUS SLIDE;  Surgeon: Wylene Simmer, MD;  Location: [place];  Service: Orthopedics;  Laterality: Right;  Right Gastroc Slide; Partial Tibia Tenolysis; Flexor Digitorum Longus Transfer to Navicular; Calcaneal Osteotomy    . OOPHORECTOMY    . PORT-A-CATH REMOVAL  2007    insertion then taken out  . REDUCTION MAMMAPLASTY Bilateral    20 plus years    There were no vitals filed for this visit.  Subjective Assessment - 09/19/17 1627    Subjective  Worse today for no apparent reason.  Moved foot around and had some time 02-30 min of less pain but after sitting pain increased     Currently in Pain?  Yes    Pain Score  8     Pain Location  Heel    Pain Orientation  Left;Lateral    Pain Descriptors / Indicators  Sharp;Sore    Pain Type  Chronic pain    Pain Onset  More than a month ago    Pain Frequency  Intermittent    Aggravating Factors   activity on feet.     Pain Relieving Factors  sat                       [facility] Adult PT Treatment/Exercise - 09/19/17 0001      Ultrasound   Ultrasound Location  LT heel cord    Ultrasound Parameters  50%  1MHZ . 1,5 Wc2    Ultrasound Goals  Pain      Iontophoresis   Type of Iontophoresis  --    Location  --    Dose  --    Time  --  Manual Therapy   Manual Therapy  Taping    Soft tissue mobilization  manally and with tool calf and heel cors and lateral foot.     Kinesiotex  Inhibit Muscle      Kinesiotix   Inhibit Muscle   one strap with Y over heel top gastroc and dross piece over painful area and 2 fans for swelling.        Ankle Exercises: Stretches   Slant Board Stretch  2 reps;60 seconds             PT Education - 09/19/17 1711    Education Details  tape management of removal with any irritation and wear 3 days if helpful and she can shower with tape    Person(s) Educated  Patient    Methods  Explanation    Comprehension  Verbalized understanding       PT Short Term Goals - 09/17/17 1706      PT SHORT TERM GOAL #1   Title  She will be independent with initial HEP    Status  On-going      PT SHORT TERM GOAL #2   Title  She will report pain decr 30% or more with walking    Status  On-going        PT Long Term Goals - 08/29/17 1411      PT LONG TERM GOAL  #1   Title  She will be independent with all hEP issued    Time  6    Period  Weeks    Status  New      PT LONG TERM GOAL #2   Title  She will report pain as intermittant with activity on feet.     Time  6    Period  Weeks    Status  New      PT LONG TERM GOAL #3   Title  She will be able to sit for a period and rise with pain increased 0-2/10 max    Time  6    Period  Weeks    Status  New            Plan - 09/19/17 1626    Clinical Impression Statement  She was worse today but on walking out she reported pain was less. I changed from ionto to kineseotape to see if  she had less pain with this modality.   Assess next week progress. Discussed spur and possible irritation with flareups and possible mechanical cause of pain from the spur    PT Treatment/Interventions  Cryotherapy;Iontophoresis 4mg /ml Dexamethasone;Ultrasound;Patient/family education;Therapeutic exercise;Manual techniques;Taping    PT Next Visit Plan  .  modalities and manual for pain assess tape and retape if helpful    PT Home Exercise Plan  Pf red band and strap DF stretch    Consulted and Agree with Plan of Care  Patient       Patient will benefit from skilled therapeutic intervention in order to improve the following deficits and impairments:  Pain, Postural dysfunction, Decreased activity tolerance, Difficulty walking  Visit Diagnosis: Pain in left ankle and joints of left foot     Problem List Patient Active Problem List   Diagnosis Date Noted  . Peripheral neuropathy 12/26/2016  . History of breast cancer in female 08/19/2012  . FOOT PAIN 06/10/2009  . PES PLANUS 06/10/2009    Darrel Hoover  PT 09/19/2017, 5:15 PM  [facility]  [place], [place], [address] Phone: [phone]   Fax:  [phone]  Name: Barbara Trevino MRN: [id number] Date of Birth: 1960-04-18

## 2017-09-24 ENCOUNTER — Ambulatory Visit: Payer: 59

## 2017-09-24 DIAGNOSIS — M25572 Pain in left ankle and joints of left foot: Secondary | ICD-10-CM | POA: Diagnosis not present

## 2017-09-24 NOTE — Therapy (Signed)
[place], [place], [address] Phone: [phone]   Fax:  [phone]  Physical Therapy Treatment  Patient Details  Name: Barbara Trevino MRN: [id number] Date of Birth: 1960-08-02 Referring Provider: Melrose Nakayama MD   Encounter Date: 09/24/2017  PT End of Session - 09/24/17 1713    Visit Number  5    Number of Visits  12    Date for PT Re-Evaluation  10/04/17    PT Start Time  0425    PT Stop Time  0505    PT Time Calculation (min)  40 min    Activity Tolerance  Patient tolerated treatment well    Behavior During Therapy  [facility] for tasks assessed/performed       Past Medical History:  Diagnosis Date  . Cancer [facility]) 2007   hx rt breast cancer-bladder cancer  . GERD (gastroesophageal reflux disease)   . Hyperlipidemia   . Neuropathy   . Peripheral neuropathy 12/26/2016  . Personal history of chemotherapy   . Personal history of radiation therapy     Past Surgical History:  Procedure Laterality Date  . ABDOMINAL HYSTERECTOMY  2001   vag hyst  . BLADDER SUSPENSION  2009   cysto bladder cancer removed  . BREAST LUMPECTOMY Right 2007  . BREAST REDUCTION SURGERY  2004  . BREAST SURGERY  2007   rt lumpectomy-snbx  . CALCANEAL OSTEOTOMY  03/06/2012   Procedure: CALCANEAL OSTEOTOMY;  Surgeon: Wylene Simmer, MD;  Location: [place];  Service: Orthopedics;  Laterality: Right;  Right Gastroc Slide; Partial Tibia Tenolysis; Flexor Digitorum Longus Transfer to Navicular; Calcaneal Osteotomy  . CARPAL TUNNEL RELEASE  2007   left  . CYSTOSCOPY WITH BIOPSY    . GASTROCNEMIUS RECESSION  03/06/2012   Procedure: GASTROCNEMIUS SLIDE;  Surgeon: Wylene Simmer, MD;  Location: [place];  Service: Orthopedics;  Laterality: Right;  Right Gastroc Slide; Partial Tibia Tenolysis; Flexor Digitorum Longus Transfer to Navicular; Calcaneal Osteotomy    . OOPHORECTOMY    . PORT-A-CATH REMOVAL  2007    insertion then taken out  . REDUCTION MAMMAPLASTY Bilateral    20 plus years    There were no vitals filed for this visit.  Subjective Assessment - 09/24/17 1626    Subjective  Felt good with Kineseotape on but worsened after 2 days of work and no tape.   Feels tape was helpful. [place] dPA but no response so far    Pain Score  7     Pain Location  Heel    Pain Orientation  Left    Pain Descriptors / Indicators  Sharp;Sore    Pain Type  Chronic pain    Pain Onset  More than a month ago    Pain Frequency  Intermittent    Aggravating Factors   being on feet     Pain Relieving Factors  rest                       [facility] Adult PT Treatment/Exercise - 09/24/17 0001      Ultrasound   Ultrasound Location  Lt heel cord to insertion    Ultrasound Parameters  100% 1MHz 1.5 Wcm2      Manual Therapy   Soft tissue mobilization  manally and with tool calf and heel cors and lateral foot.     Kinesiotex  Inhibit Muscle      Kinesiotix   Inhibit Muscle  one strap with Y over heel top gastroc and dross piece over painful area and 2 fans for swelling.        Ankle Exercises: Stretches   Slant Board Stretch  2 reps;60 seconds               PT Short Term Goals - 09/24/17 1717      PT SHORT TERM GOAL #1   Title  She will be independent with initial HEP    Status  Achieved      PT SHORT TERM GOAL #2   Title  She will report pain decr 30% or more with walking    Baseline  too variable    Status  On-going        PT Long Term Goals - 08/29/17 1411      PT LONG TERM GOAL #1   Title  She will be independent with all hEP issued    Time  6    Period  Weeks    Status  New      PT LONG TERM GOAL #2   Title  She will report pain as intermittant with activity on feet.     Time  6    Period  Weeks    Status  New      PT LONG TERM GOAL #3   Title  She will be able to sit for a period and rise with pain increased 0-2/10 max    Time  6    Period  Weeks     Status  New            Plan - 09/24/17 1625    Clinical Impression Statement  She reports always feeling much better after session and kineseotape may have been beneficial  so will assess next visit.Marland Kitchen  continue manual an modalities  and stretching. Progress does not last unfotunately    PT Treatment/Interventions  Cryotherapy;Iontophoresis 4mg /ml Dexamethasone;Ultrasound;Patient/family education;Therapeutic exercise;Manual techniques;Taping    PT Next Visit Plan  .  modalities and manual for pain assess tape and retape if helpful Pt will remove heel cups agagin to see if this will decr pain. at work    PT Home Exercise Plan  Pf red band and strap DF stretch , standing stretch    Consulted and Agree with Plan of Care  Patient       Patient will benefit from skilled therapeutic intervention in order to improve the following deficits and impairments:  Pain, Postural dysfunction, Decreased activity tolerance, Difficulty walking  Visit Diagnosis: Pain in left ankle and joints of left foot     Problem List Patient Active Problem List   Diagnosis Date Noted  . Peripheral neuropathy 12/26/2016  . History of breast cancer in female 08/19/2012  . FOOT PAIN 06/10/2009  . PES PLANUS 06/10/2009    Darrel Hoover  PT 09/24/2017, 5:19 PM  [facility] [address] [place], [place], [address] Phone: [phone]   Fax:  [phone]  Name: Barbara Trevino MRN: [id number] Date of Birth: October 18, 1960

## 2017-09-25 MED FILL — traMADol HCL 50 MG TABS: 50 | 8 days supply | Qty: 60 | Fill #0

## 2017-09-25 MED FILL — HYDROCODON-APAP 5-325: 5-325 | 10 days supply | Qty: 40 | Fill #0

## 2017-09-25 MED FILL — predniSONE 5 MG TABS: 5 | 6 days supply | Qty: 21 | Fill #0

## 2017-09-26 ENCOUNTER — Ambulatory Visit: Payer: 59

## 2017-09-26 DIAGNOSIS — M25572 Pain in left ankle and joints of left foot: Secondary | ICD-10-CM

## 2017-09-26 NOTE — Therapy (Signed)
[place], [place], [address] Phone: [phone]   Fax:  [phone]  Patient Details  Name: Barbara Trevino MRN: [id number] Date of Birth: [birth year] Referring Provider:  Gaynelle Arabian, MD  Encounter Date: 09/26/2017      Barbara Trevino arrived and reported no pain even after working all day. She feels the taping and the new dose pack of steroids is stopping the pain. We agreed that as she is doing well we should let the meds and tape continue until next week and assess continued improvement. She will remove the tape this Saturday or Sunday and finish meds  next Tuesday and she will return too PT then to see if pain is still gone and assess further treatment.   Darrel Hoover  PT 09/26/2017, 5:02 PM  [facility] [address] [place], [place], [address] Phone: [phone]   Fax:  [phone]

## 2017-09-30 ENCOUNTER — Encounter: Payer: Self-pay | Admitting: Physical Therapy

## 2017-09-30 ENCOUNTER — Ambulatory Visit: Payer: 59 | Attending: Family Medicine | Admitting: Physical Therapy

## 2017-09-30 DIAGNOSIS — M25572 Pain in left ankle and joints of left foot: Secondary | ICD-10-CM | POA: Diagnosis not present

## 2017-09-30 NOTE — Therapy (Signed)
[place], [place], [address] Phone: [phone]   Fax:  [phone]  Physical Therapy Treatment  Patient Details  Name: Barbara Trevino MRN: [id number] Date of Birth: [birth year] Referring Provider: Melrose Nakayama MD   Encounter Date: 09/30/2017  PT End of Session - 09/30/17 1718    Visit Number  6    Number of Visits  12    Date for PT Re-Evaluation  10/04/17    PT Start Time  0428    PT Stop Time  0452    PT Time Calculation (min)  24 min       Past Medical History:  Diagnosis Date  . Cancer [facility]) 2007   hx rt breast cancer-bladder cancer  . GERD (gastroesophageal reflux disease)   . Hyperlipidemia   . Neuropathy   . Peripheral neuropathy 12/26/2016  . Personal history of chemotherapy   . Personal history of radiation therapy     Past Surgical History:  Procedure Laterality Date  . ABDOMINAL HYSTERECTOMY  2001   vag hyst  . BLADDER SUSPENSION  2009   cysto bladder cancer removed  . BREAST LUMPECTOMY Right 2007  . BREAST REDUCTION SURGERY  2004  . BREAST SURGERY  2007   rt lumpectomy-snbx  . CALCANEAL OSTEOTOMY  03/06/2012   Procedure: CALCANEAL OSTEOTOMY;  Surgeon: Wylene Simmer, MD;  Location: [place];  Service: Orthopedics;  Laterality: Right;  Right Gastroc Slide; Partial Tibia Tenolysis; Flexor Digitorum Longus Transfer to Navicular; Calcaneal Osteotomy  . CARPAL TUNNEL RELEASE  2007   left  . CYSTOSCOPY WITH BIOPSY    . GASTROCNEMIUS RECESSION  03/06/2012   Procedure: GASTROCNEMIUS SLIDE;  Surgeon: Wylene Simmer, MD;  Location: [place];  Service: Orthopedics;  Laterality: Right;  Right Gastroc Slide; Partial Tibia Tenolysis; Flexor Digitorum Longus Transfer to Navicular; Calcaneal Osteotomy    . OOPHORECTOMY    . PORT-A-CATH REMOVAL  2007   insertion then taken out  . REDUCTION MAMMAPLASTY Bilateral    20 plus years    There were no vitals filed for  this visit.  Subjective Assessment - 09/30/17 1630    Subjective  Within an our of taking the tape off yesterday, the pain returned.     Currently in Pain?  Yes    Pain Score  8     Pain Location  Heel    Pain Orientation  Left    Pain Descriptors / Indicators  Sharp;Sore    Pain Type  Chronic pain    Aggravating Factors   no tape     Pain Relieving Factors  tape                       [facility] Adult PT Treatment/Exercise - 09/30/17 0001      Self-Care   Self-Care  Heat/Ice Application;Other Self-Care Comments    RICE  --    Heat/Ice Application  USe of ice pack at home , 10 minuts at a time, 2-3 times a day for inflammation    Other Self-Care Comments   The need to continue HEP despite improvement with tape       Kinesiotix   Inhibit Muscle   one strap with Y over heel top gastroc and cross piece over painful area and 2 fans for swelling.                 PT Short Term Goals - 09/24/17 1717  PT SHORT TERM GOAL #1   Title  She will be independent with initial HEP    Status  Achieved      PT SHORT TERM GOAL #2   Title  She will report pain decr 30% or more with walking    Baseline  too variable    Status  On-going        PT Long Term Goals - 08/29/17 1411      PT LONG TERM GOAL #1   Title  She will be independent with all hEP issued    Time  6    Period  Weeks    Status  New      PT LONG TERM GOAL #2   Title  She will report pain as intermittant with activity on feet.     Time  6    Period  Weeks    Status  New      PT LONG TERM GOAL #3   Title  She will be able to sit for a period and rise with pain increased 0-2/10 max    Time  6    Period  Weeks    Status  New            Plan - 09/30/17 1713    Clinical Impression Statement  pt arrives with antalgic gait and reports increased pain one hour after removing kinesiotape yesterday. She took final dose of prednisone today. She feels the tape is the source of pain relief. Today,  due to her son having an accident, she would like to be re-taped and end session early. Afte tape applied, pt reports pain decreased from 8/10 to 5/10. Recommended she try ice at home. Also, recommended she continue HEP despite feeling better when tape is on. She has one more visit in [place]. Will need ERO after next treatment.     PT Next Visit Plan  .  modalities and manual for pain assess tape and retape if helpful Pt will remove heel cups agagin to see if this will decr pain. at work : ERO on July 9th    [place]  Pf red band and strap DF stretch , standing stretch    Consulted and Agree with Plan of Care  Patient       Patient will benefit from skilled therapeutic intervention in order to improve the following deficits and impairments:  Pain, Postural dysfunction, Decreased activity tolerance, Difficulty walking  Visit Diagnosis: Pain in left ankle and joints of left foot     Problem List Patient Active Problem List   Diagnosis Date Noted  . Peripheral neuropathy 12/26/2016  . History of breast cancer in female 08/19/2012  . FOOT PAIN 06/10/2009  . PES PLANUS 06/10/2009    Dorene Ar, PTA 09/30/2017, [place], [place], [address] Phone: [phone]   Fax:  [phone]  Name: Barbara Trevino MRN: [id number] Date of Birth: 1960/10/18

## 2017-10-02 ENCOUNTER — Ambulatory Visit: Payer: 59 | Admitting: Physical Therapy

## 2017-10-02 ENCOUNTER — Encounter: Payer: Self-pay | Admitting: Physical Therapy

## 2017-10-02 DIAGNOSIS — M25572 Pain in left ankle and joints of left foot: Secondary | ICD-10-CM

## 2017-10-02 NOTE — Patient Instructions (Addendum)
Inversion (Eccentric), (Resistance Band)    Pull foot in ( also in other direction)  against resistance band. Slowly release for 3-5 seconds. Use ___green_____ resistance band. __10 reps per set, _1__ sets per day, _7__ days per week.  http://ecce.exer.us/5   Copyright  VHI. All rights reserved.  Gastroc / Plantar Fascia, Sitting With Partner    Sit with heel in one hand of partner, other over toes. Have partner  Or yourself, gently push toes toward trunk. To increase stretch, gently lean forward toward partner. Hold _5-10__ seconds. Repeat3-5 ___ times per session. Do __1_ sessions per day.  Copyright  VHI. All rights reserved.  Heel raise on step and slowly lower heel off step,  taking 3- 5 seconds 5 X  daily

## 2017-10-02 NOTE — Therapy (Signed)
[place], [place], [address] Phone: [phone]   Fax:  [phone]  Physical Therapy Treatment  Patient Details  Name: Barbara Trevino MRN: [id number] Date of Birth: 08-Feb-1961 Referring Provider: Melrose Nakayama MD   Encounter Date: 10/02/2017  PT End of Session - 10/02/17 1745    Visit Number  7    Number of Visits  12    Date for PT Re-Evaluation  10/04/17    PT Start Time  1634    PT Stop Time  1730    PT Time Calculation (min)  56 min    Activity Tolerance  Patient tolerated treatment well    Behavior During Therapy  [facility] for tasks assessed/performed       Past Medical History:  Diagnosis Date  . Cancer [facility]) 2007   hx rt breast cancer-bladder cancer  . GERD (gastroesophageal reflux disease)   . Hyperlipidemia   . Neuropathy   . Peripheral neuropathy 12/26/2016  . Personal history of chemotherapy   . Personal history of radiation therapy     Past Surgical History:  Procedure Laterality Date  . ABDOMINAL HYSTERECTOMY  2001   vag hyst  . BLADDER SUSPENSION  2009   cysto bladder cancer removed  . BREAST LUMPECTOMY Right 2007  . BREAST REDUCTION SURGERY  2004  . BREAST SURGERY  2007   rt lumpectomy-snbx  . CALCANEAL OSTEOTOMY  03/06/2012   Procedure: CALCANEAL OSTEOTOMY;  Surgeon: Wylene Simmer, MD;  Location: [place];  Service: Orthopedics;  Laterality: Right;  Right Gastroc Slide; Partial Tibia Tenolysis; Flexor Digitorum Longus Transfer to Navicular; Calcaneal Osteotomy  . CARPAL TUNNEL RELEASE  2007   left  . CYSTOSCOPY WITH BIOPSY    . GASTROCNEMIUS RECESSION  03/06/2012   Procedure: GASTROCNEMIUS SLIDE;  Surgeon: Wylene Simmer, MD;  Location: [place];  Service: Orthopedics;  Laterality: Right;  Right Gastroc Slide; Partial Tibia Tenolysis; Flexor Digitorum Longus Transfer to Navicular; Calcaneal Osteotomy    . OOPHORECTOMY    . PORT-A-CATH REMOVAL  2007    insertion then taken out  . REDUCTION MAMMAPLASTY Bilateral    20 plus years    There were no vitals filed for this visit.  Subjective Assessment - 10/02/17 1749    Subjective  Not any better,  wears arch supports only at work.  This is affecting every aspect of my life.  I do the exercises but not every day.  I have neuropathy in my foot. and it is spreading    Currently in Pain?  Yes    Pain Score  8     Pain Location  Heel    Pain Orientation  Left    Pain Descriptors / Indicators  Sharp;Sore    Pain Type  Chronic pain    Pain Frequency  Intermittent    Aggravating Factors   walking,  standing    Pain Relieving Factors  tape,  tip toe walking,  The steriod pack wore off                       [facility] Adult PT Treatment/Exercise - 10/02/17 0001      Self-Care   Other Self-Care Comments   anatomy,  trying to normalize forces in the foot to decrease pain in achiller      Manual Therapy   Manual therapy comments  fibular a/P and tallocural joint posterior glides with movement.  to increase DF,  toe extension    Soft tissue mobilization  cross friction massage,   tender lateral retenaculum insertion area heel    Kinesiotex  Inhibit Muscle;Facilitate Muscle      Kinesiotix   Inhibit Muscle   achilles    Facilitate Muscle   Tibialis anterior      Ankle Exercises: Stretches   Plantar Fascia Stretch  5 reps;10 seconds HEP    Gastroc Stretch  5 reps eccentric focue,  cued,  HEP      Ankle Exercises: Seated   Other Seated Ankle Exercises  eccentric band IV/EV  10 X each green,   HEP  able to do with decreased pain             PT Education - 10/02/17 1744    Education Details  anatomy, HEP    Person(s) Educated  Patient    Methods  Explanation;Demonstration;Tactile cues;Verbal cues;Handout    Comprehension  Verbalized understanding;Returned demonstration       PT Short Term Goals - 09/24/17 1717      PT SHORT TERM GOAL #1   Title  She will be  independent with initial HEP    Status  Achieved      PT SHORT TERM GOAL #2   Title  She will report pain decr 30% or more with walking    Baseline  too variable    Status  On-going        PT Long Term Goals - 08/29/17 1411      PT LONG TERM GOAL #1   Title  She will be independent with all hEP issued    Time  6    Period  Weeks    Status  New      PT LONG TERM GOAL #2   Title  She will report pain as intermittant with activity on feet.     Time  6    Period  Weeks    Status  New      PT LONG TERM GOAL #3   Title  She will be able to sit for a period and rise with pain increased 0-2/10 max    Time  6    Period  Weeks    Status  New            Plan - 10/02/17 1746    Clinical Impression Statement  Able to progress HEP.  Patient had increased pain with exercise and manual.  (mild)  she declined the need of modalities.  She is frustrated about [place] [employer].  iMPROVED TOE EXTENSION   noted post manual.     PT Next Visit Plan  .  modalities and manual for pain assess tape and retape if helpful . at work  continue eccentric ankle  : ERO on July 9th    PT Home Exercise Plan  Pf red band and strap DF stretch , standing stretch,  eccentric gastroc on step, and IV/EV green band,  fascial stretch in arch    Consulted and Agree with Plan of Care  Patient       Patient will benefit from skilled therapeutic intervention in order to improve the following deficits and impairments:     Visit Diagnosis: Pain in left ankle and joints of left foot     Problem List Patient Active Problem List   Diagnosis Date Noted  . Peripheral neuropathy 12/26/2016  . History of breast cancer in female 08/19/2012  . FOOT PAIN 06/10/2009  . PES  PLANUS 06/10/2009    Caeson Filippi  PTA 10/02/2017, 5:58 PM  [facility] [address] [place], [place], [address] Phone: [phone]   Fax:  [phone]  Name: Barbara Trevino MRN:  [id number] Date of Birth: 07-Dec-1960

## 2017-10-08 ENCOUNTER — Ambulatory Visit: Payer: 59

## 2017-10-08 DIAGNOSIS — M25572 Pain in left ankle and joints of left foot: Secondary | ICD-10-CM

## 2017-10-08 MED FILL — MYRBETRIQ ER 50 MG TABLET: 50 | 90 days supply | Qty: 90 | Fill #3

## 2017-10-08 NOTE — Therapy (Addendum)
[place], [place], [address] Phone: [phone]   Fax:  [phone]  Patient Details  Name: Barbara Trevino MRN: [id number] Date of Birth: Aug 16, 1960 Referring Provider:  Melrose Nakayama, MD  Encounter Date: 10/08/2017                                                                                                     Today Ms Mcelhinney arrived with out limp but reporting 6/10 pain. She reports frustration as she does not feel she is better . She is doing her HEP. We have  Used [place], ionto, taping , stretching and band exercises  and STW without any lasting benefit.  I have asked her to talk to you or her Orthopedist for feedback as to her next choice . We would be happy to continue PT for a few more visits to see if she can improve if you wish.    Thanks and sorry she has not made any improvement.  PHYSICAL THERAPY DISCHARGE SUMMARY  Visits from Start of Care: 9 Current functional level related to goals / functional outcomes: See above   Remaining deficits: See above   Education / Equipment: HEP Plan:                                                    Patient goals were partially met. Patient is being discharged due to not returning since the last visit.  ?????   Pearson Forster PT 12/05/17  Darrel Hoover  PT 10/08/2017, 4:49 PM  [place] [facility] [address] [place], [place], [address] Phone: [phone]   Fax:  [phone]

## 2017-10-14 ENCOUNTER — Ambulatory Visit: Payer: 59 | Admitting: Adult Health

## 2017-10-25 MED FILL — HYDROCODON-APAP 5-325: 5-325 | 10 days supply | Qty: 40 | Fill #0

## 2017-10-25 MED FILL — traMADol HCL 50 MG TABS: 50 | 8 days supply | Qty: 60 | Fill #0

## 2017-11-13 MED FILL — MELOXICAM 15 MG TABLET: 15 | 90 days supply | Qty: 90 | Fill #0

## 2017-11-13 MED FILL — VENLAFAXINE HCL ER 150 MG C: 150 | 90 days supply | Qty: 90 | Fill #0

## 2017-11-13 MED FILL — AMITRIPTYLINE HCL 25 MG TAB: 25 | 90 days supply | Qty: 180 | Fill #0

## 2017-11-22 DIAGNOSIS — N3946 Mixed incontinence: Secondary | ICD-10-CM | POA: Diagnosis not present

## 2017-11-22 DIAGNOSIS — Z8551 Personal history of malignant neoplasm of bladder: Secondary | ICD-10-CM | POA: Diagnosis not present

## 2017-11-28 MED FILL — CYCLOBENZAPRINE 10 MG TAB: 10 | 10 days supply | Qty: 30 | Fill #0

## 2017-11-28 MED FILL — traMADol HCL 50 MG TABS: 50 | 8 days supply | Qty: 60 | Fill #0

## 2017-11-28 MED FILL — HYDROCODON-APAP 5-325: 5-325 | 10 days supply | Qty: 40 | Fill #0

## 2018-01-01 MED FILL — HYDROCODON-APAP 5-325: 5-325 | 10 days supply | Qty: 40 | Fill #0

## 2018-01-01 MED FILL — traMADol HCL 50 MG TABS: 50 | 8 days supply | Qty: 60 | Fill #0

## 2018-01-01 MED FILL — predniSONE 10 MG TABS: 10 | 6 days supply | Qty: 21 | Fill #0

## 2018-01-23 MED FILL — MYRBETRIQ ER 50 MG TABLET: 50 | 90 days supply | Qty: 90 | Fill #0

## 2018-01-23 MED FILL — PANTOPRAZOLE SOD DR 40 MG T: 40 | 90 days supply | Qty: 90 | Fill #1

## 2018-01-23 MED FILL — ROSUVASTATIN CALCIUM 20 MG: 20 | 90 days supply | Qty: 90 | Fill #1

## 2018-01-31 DIAGNOSIS — M79672 Pain in left foot: Secondary | ICD-10-CM | POA: Diagnosis not present

## 2018-02-04 MED FILL — traMADol HCL 50 MG TABS: 50 | 8 days supply | Qty: 60 | Fill #0

## 2018-02-04 MED FILL — HYDROCODON-APAP 5-325: 5-325 | 10 days supply | Qty: 40 | Fill #0

## 2018-02-14 DIAGNOSIS — H5213 Myopia, bilateral: Secondary | ICD-10-CM | POA: Diagnosis not present

## 2018-02-14 DIAGNOSIS — H524 Presbyopia: Secondary | ICD-10-CM | POA: Diagnosis not present

## 2018-02-14 DIAGNOSIS — M7662 Achilles tendinitis, left leg: Secondary | ICD-10-CM | POA: Diagnosis not present

## 2018-02-14 DIAGNOSIS — H5201 Hypermetropia, right eye: Secondary | ICD-10-CM | POA: Diagnosis not present

## 2018-03-06 MED FILL — AMITRIPTYLINE HCL 25 MG TAB: 25 | 90 days supply | Qty: 180 | Fill #1

## 2018-03-06 MED FILL — VENLAFAXINE HCL ER 150 MG C: 150 | 90 days supply | Qty: 90 | Fill #1

## 2018-03-06 MED FILL — MELOXICAM 15 MG TABLET: 15 | 90 days supply | Qty: 90 | Fill #1

## 2018-03-12 MED FILL — HYDROCODON-APAP 5-325: 5-325 | 10 days supply | Qty: 40 | Fill #0

## 2018-03-12 MED FILL — traMADol HCL 50 MG TABS: 50 | 8 days supply | Qty: 60 | Fill #0

## 2018-04-10 ENCOUNTER — Ambulatory Visit: Payer: 59 | Admitting: Adult Health

## 2018-04-14 MED FILL — traMADol HCL 50 MG TABS: 50 | 8 days supply | Qty: 60 | Fill #0

## 2018-04-15 MED FILL — MYRBETRIQ ER 50 MG TABLET: 50 | 90 days supply | Qty: 90 | Fill #1

## 2018-04-15 MED FILL — PANTOPRAZOLE SOD DR 40 MG T: 40 | 90 days supply | Qty: 90 | Fill #2

## 2018-04-15 MED FILL — HYDROCODON-APAP 5-325: 5-325 | 10 days supply | Qty: 40 | Fill #0

## 2018-04-15 MED FILL — ROSUVASTATIN CALCIUM 20 MG: 20 | 90 days supply | Qty: 90 | Fill #2

## 2018-04-24 ENCOUNTER — Other Ambulatory Visit (HOSPITAL_COMMUNITY): Payer: Self-pay | Admitting: Student

## 2018-04-24 ENCOUNTER — Other Ambulatory Visit: Payer: Self-pay | Admitting: Student

## 2018-04-24 DIAGNOSIS — M25572 Pain in left ankle and joints of left foot: Secondary | ICD-10-CM

## 2018-04-28 ENCOUNTER — Ambulatory Visit (HOSPITAL_COMMUNITY)
Admission: RE | Admit: 2018-04-28 | Discharge: 2018-04-28 | Disposition: A | Discharge status: Home / Self Care | Payer: 59 | Source: Ambulatory Visit | Attending: Student | Admitting: Student

## 2018-04-28 DIAGNOSIS — M25572 Pain in left ankle and joints of left foot: Secondary | ICD-10-CM | POA: Diagnosis not present

## 2018-04-28 DIAGNOSIS — S86012A Strain of left Achilles tendon, initial encounter: Secondary | ICD-10-CM | POA: Diagnosis not present

## 2018-05-23 MED FILL — MELOXICAM 15 MG TABLET: 15 | 30 days supply | Qty: 30 | Fill #0

## 2018-05-28 ENCOUNTER — Telehealth: Payer: Self-pay | Admitting: Neurology

## 2018-05-28 ENCOUNTER — Other Ambulatory Visit: Payer: Self-pay | Admitting: Neurology

## 2018-05-28 MED ORDER — GABAPENTIN 300 MG PO CAPS: 600.0000 mg | ORAL_CAPSULE | Freq: Two times a day (BID) | ORAL | 3 refills | Status: DC

## 2018-05-28 MED FILL — AMITRIPTYLINE HCL 25 MG TAB: 25 | 90 days supply | Qty: 180 | Fill #2

## 2018-05-28 MED FILL — GABAPENTIN 300 MG CAPSULE: 300 | 90 days supply | Qty: 360 | Fill #0

## 2018-05-28 MED FILL — VENLAFAXINE HCL ER 150 MG C: 150 | 90 days supply | Qty: 90 | Fill #2

## 2018-05-28 NOTE — Telephone Encounter (Signed)
I contacted the pt and advised we would be able to send her rx for the gabapentin in pending her appt with MM, NP on 08/06/18. Pt requested rx to be sent to [facility] pt.

## 2018-05-28 NOTE — Telephone Encounter (Signed)
Pt has called, she is out of the medication. Pt c/a 10/18/17 due to being out of town. appt was c/a on 1/09 due to provider out of the office. The 1st available was 5/16. She is concerned about abruptly stopping the medication. Please call to advise

## 2018-06-11 ENCOUNTER — Other Ambulatory Visit: Payer: Self-pay

## 2018-06-11 ENCOUNTER — Encounter (INDEPENDENT_AMBULATORY_CARE_PROVIDER_SITE_OTHER): Payer: 59

## 2018-06-18 ENCOUNTER — Other Ambulatory Visit: Payer: Self-pay

## 2018-06-18 ENCOUNTER — Encounter (INDEPENDENT_AMBULATORY_CARE_PROVIDER_SITE_OTHER): Payer: Self-pay

## 2018-06-18 ENCOUNTER — Ambulatory Visit (INDEPENDENT_AMBULATORY_CARE_PROVIDER_SITE_OTHER): Payer: 59 | Admitting: Family Medicine

## 2018-06-18 ENCOUNTER — Encounter (INDEPENDENT_AMBULATORY_CARE_PROVIDER_SITE_OTHER): Payer: Self-pay | Admitting: Family Medicine

## 2018-06-18 VITALS — BP 132/82 | HR 85 | Ht 69.0 in | Wt 288.0 lb

## 2018-06-18 DIAGNOSIS — K219 Gastro-esophageal reflux disease without esophagitis: Secondary | ICD-10-CM | POA: Diagnosis not present

## 2018-06-18 DIAGNOSIS — E559 Vitamin D deficiency, unspecified: Secondary | ICD-10-CM

## 2018-06-18 DIAGNOSIS — Z6841 Body Mass Index (BMI) 40.0 and over, adult: Secondary | ICD-10-CM

## 2018-06-18 DIAGNOSIS — E7849 Other hyperlipidemia: Secondary | ICD-10-CM

## 2018-06-18 DIAGNOSIS — R5383 Other fatigue: Secondary | ICD-10-CM | POA: Diagnosis not present

## 2018-06-18 DIAGNOSIS — Z9189 Other specified personal risk factors, not elsewhere classified: Secondary | ICD-10-CM | POA: Diagnosis not present

## 2018-06-18 DIAGNOSIS — R0602 Shortness of breath: Secondary | ICD-10-CM

## 2018-06-18 DIAGNOSIS — E66813 Obesity, class 3: Secondary | ICD-10-CM

## 2018-06-18 DIAGNOSIS — Z0289 Encounter for other administrative examinations: Secondary | ICD-10-CM

## 2018-06-18 DIAGNOSIS — Z1331 Encounter for screening for depression: Secondary | ICD-10-CM | POA: Diagnosis not present

## 2018-06-18 NOTE — Progress Notes (Signed)
.  Office: [phone]  /  Fax: [phone]   HPI:   Chief Complaint: Barbara Trevino (MR# [id number]) is a 58 y.o. female who presents on 06/18/2018 for obesity evaluation and treatment. She states she heard about our clinic from a co-worker. Current BMI is Body mass index is 42.53 kg/m.Marland Kitchen Barbara Trevino has struggled with obesity for years and has been unsuccessful in either losing weight or maintaining long term weight loss. Barbara Trevino attended our information session and states she is currently in the action stage of change and ready to dedicate time achieving and maintaining a healthier weight.  Barbara Trevino states her desired weight loss is 108 lbs she has been heavy most of  her life off and on she started gaining weight after her divorce and with some foot problems her heaviest weight ever was 290 lbs. she snacks frequently in the evenings she sometimes makes poor food choices she sometimes eats larger portions than normal  she has binge eating behaviors she struggles with emotional eating    Fatigue Barbara Trevino feels her energy is lower than it should be. This has worsened with weight gain and has worsened recently. Barbara Trevino denies daytime somnolence and admits to waking up still tired at times. Patient is at risk for obstructive sleep apnea. Patient generally gets 6-7 hours of sleep per night, and states she generally has restful sleep. Snoring is present. Apneic episodes are not present. Epworth Sleepiness Score is 3.  Dyspnea on exertion Barbara Trevino notes increasing shortness of breath with exercising and seems to be worsening over time with weight gain. She notes getting out of breath sooner with activity than she used to. This has not gotten worse recently. EKG showed normal sinus rhythm at 90 BPM with poor R-wave progression. Barbara Trevino denies orthopnea.  Hyperlipidemia Barbara Trevino has hyperlipidemia and is on Crestor. She has been trying to improve her cholesterol levels with intensive lifestyle modification  including a low saturated fat diet, exercise and weight loss. She denies myalgias.  At risk for cardiovascular disease Barbara Trevino is at a higher than average risk for cardiovascular disease due to obesity. She currently denies any chest pain.  Gastroesophageal Reflux Disease (GERD) Barbara Trevino states her symptoms are well controlled.  Vitamin D deficiency Barbara Trevino has a diagnosis of Vitamin D deficiency. She is currently taking OTC Vit D and denies nausea, vomiting or muscle weakness.  Depression Screen Barbara Trevino's Food and Mood (modified PHQ-9) score was 11.  Depression screen PHQ 2/9 06/18/2018  Decreased Interest 3  Down, Depressed, Hopeless 2  PHQ - 2 Score 5  Altered sleeping 0  Tired, decreased energy 3  Change in appetite 2  Feeling bad or failure about yourself  0  Trouble concentrating 0  Moving slowly or fidgety/restless 1  Suicidal thoughts 0  PHQ-9 Score 11  Difficult doing work/chores Somewhat difficult   ASSESSMENT AND PLAN:  Other fatigue - Plan: EKG 12-Lead, Vitamin B12, CBC With Differential, Comprehensive metabolic panel, Folate, Hemoglobin A1c, Insulin, random, T3, T4, free, TSH  Shortness of breath on exertion  Other hyperlipidemia - Plan: Lipid Panel With LDL/HDL Ratio  Gastroesophageal reflux disease, esophagitis presence not specified  Vitamin D deficiency - Plan: VITAMIN D 25 Hydroxy (Vit-D Deficiency, Fractures)  Depression screening  At risk for heart disease  Class 3 severe obesity with serious comorbidity and body mass index (BMI) of 40.0 to 44.9 in adult, unspecified obesity type (HCC)  PLAN:  Fatigue Barbara Trevino was informed that her fatigue may be related to obesity, depression or  many other causes. Labs will be ordered, and in the meanwhile Barbara Trevino has agreed to work on diet, exercise and weight loss to help with fatigue. Proper sleep hygiene was discussed including the need for 7-8 hours of quality sleep each night. A sleep study was not ordered based on  symptoms and Epworth score.  Dyspnea on exertion Barbara Trevino's shortness of breath appears to be obesity related and exercise induced. She has agreed to work on weight loss and gradually increase exercise to treat her exercise induced shortness of breath. If Barbara Trevino follows our instructions and loses weight without improvement of her shortness of breath, we will plan to refer to pulmonology. We will monitor this condition regularly. Barbara Trevino will have EKG, IC, and labs done today and she agrees to this plan.  Hyperlipidemia Barbara Trevino was informed of the American Heart Association Guidelines emphasizing intensive lifestyle modifications as the first line treatment for hyperlipidemia. We discussed many lifestyle modifications today in depth, and Barbara Trevino will continue to work on decreasing saturated fats such as fatty red meat, butter and many fried foods. She will also increase vegetables and lean protein in her diet and continue to work on exercise and weight loss efforts. Barbara Trevino will have FLP drawn today.  Cardiovascular risk counseling Barbara Trevino was given extended (15 minutes) coronary artery disease prevention counseling today. She is 57 y.o. female and has risk factors for heart disease including obesity. We discussed intensive lifestyle modifications today with an emphasis on specific weight loss instructions and strategies. Pt was also informed of the importance of increasing exercise and decreasing saturated fats to help prevent heart disease.  Gastroesophageal Reflux Disease (GERD) Barbara Trevino will continue taking Protonix.  Vitamin D Deficiency Barbara Trevino was informed that low Vitamin D levels contributes to fatigue and are associated with obesity, breast, and colon cancer. She will continue taking OTC Vit D and will have Vitamin D level checked today. Barbara Trevino agrees to follow-up with our clinic in 2 weeks.  Depression Screen Barbara Trevino had a moderately positive depression screening. Depression is commonly associated with obesity  and often results in emotional eating behaviors. We will monitor this closely and work on CBT to help improve the non-hunger eating patterns. Referral to Psychology may be required if no improvement is seen as she continues in our clinic.  Obesity Barbara Trevino is currently in the action stage of change and her goal is to continue with weight loss efforts She has agreed to follow the Category 4 plan. Barbara Trevino has been instructed to work up to a goal of 150 minutes of combined cardio and strengthening exercise per week for weight loss and overall health benefits. We discussed the following Behavioral Modification Strategies today: increasing lean protein intake, increasing vegetables, work on meal planning, easy cooking plans, and planning for success.  Barbara Trevino has agreed to follow up with our clinic in 2 weeks. She was informed of the importance of frequent follow up visits to maximize her success with intensive lifestyle modifications for her multiple health conditions. She was informed we would discuss her lab results at her next visit unless there is a critical issue that needs to be addressed sooner. Barbara Trevino agreed to keep her next visit at the agreed upon time to discuss these results.  ALLERGIES: Allergies  Allergen Reactions  . Septra [Sulfamethoxazole-Trimethoprim] Nausea And Vomiting    Very sick per patient    MEDICATIONS: Current Outpatient Medications on File Prior to Visit  Medication Sig Dispense Refill  . amitriptyline (ELAVIL) 25 MG tablet Take 25 mg  by mouth daily.  3  . Calcium-Vitamin D (CALTRATE 600 PLUS-VIT D PO) Take 1,200 mg by mouth daily.    . cholecalciferol (VITAMIN D) 1000 UNITS tablet Take 1,000 Units by mouth daily.    Marland Kitchen gabapentin (NEURONTIN) 300 MG capsule Take 2 capsules (600 mg total) by mouth 2 (two) times daily. 360 capsule 3  . ibuprofen (ADVIL,MOTRIN) 200 MG tablet Take 200 mg by mouth every 6 (six) hours as needed.    . loratadine (CLARITIN) 10 MG tablet Take 10 mg  by mouth daily as needed for allergies.    . meloxicam (MOBIC) 15 MG tablet Take 15 mg by mouth daily.    Marland Kitchen MYRBETRIQ 50 MG TB24 tablet Take 50 mg by mouth daily.  11  . pantoprazole (PROTONIX) 40 MG tablet TAKE 1 TABLET BY MOUTH ONCE DAILY FOR ACID REFLUX  3  . rosuvastatin (CRESTOR) 20 MG tablet Take 20 mg by mouth daily.    . traZODone (DESYREL) 50 MG tablet TAKE 1, 2, OR 3 TABLETS BY MOUTH AT BEDTIME FOR SLEEP DISTURBANCE  3  . venlafaxine XR (EFFEXOR-XR) 150 MG 24 hr capsule TAKE 1 CAPSULE BY MOUTH DAILY. 90 capsule 2   No current facility-administered medications on file prior to visit.     PAST MEDICAL HISTORY: Past Medical History:  Diagnosis Date  . Achilles tendinosis   . Cancer [facility]) 2007   hx rt breast cancer-bladder cancer  . Fatty liver   . GERD (gastroesophageal reflux disease)   . Hyperlipidemia   . Neuropathy   . Osteoarthritis   . Peripheral neuropathy 12/26/2016  . Personal history of chemotherapy   . Personal history of radiation therapy     PAST SURGICAL HISTORY: Past Surgical History:  Procedure Laterality Date  . ABDOMINAL HYSTERECTOMY  2001   vag hyst  . BLADDER SUSPENSION  2009   cysto bladder cancer removed  . BREAST LUMPECTOMY Right 2007  . BREAST REDUCTION SURGERY  2004  . BREAST SURGERY  2007   rt lumpectomy-snbx  . CALCANEAL OSTEOTOMY  03/06/2012   Procedure: CALCANEAL OSTEOTOMY;  Surgeon: Wylene Simmer, MD;  Location: [facility];  Service: Orthopedics;  Laterality: Right;  Right Gastroc Slide; Partial Tibia Tenolysis; Flexor Digitorum Longus Transfer to Navicular; Calcaneal Osteotomy  . CARPAL TUNNEL RELEASE  2007   left  . CYSTOSCOPY WITH BIOPSY    . GASTROCNEMIUS RECESSION  03/06/2012   Procedure: GASTROCNEMIUS SLIDE;  Surgeon: Wylene Simmer, MD;  Location: [place];  Service: Orthopedics;  Laterality: Right;  Right Gastroc Slide; Partial Tibia Tenolysis; Flexor Digitorum Longus Transfer to Navicular; Calcaneal  Osteotomy    . OOPHORECTOMY    . PORT-A-CATH REMOVAL  2007   insertion then taken out  . REDUCTION MAMMAPLASTY Bilateral    20 plus years    SOCIAL HISTORY: Social History   Tobacco Use  . Smoking status: Never Smoker  . Smokeless tobacco: Never Used  Substance Use Topics  . Alcohol use: No  . Drug use: No    FAMILY HISTORY: Family History  Problem Relation Age of Onset  . Cancer Mother 63       Breast cancer  . Hypertension Mother   . Arthritis Mother   . Breast cancer Mother   . Hyperlipidemia Mother   . Hypertension Father   . Arthritis Father   . CAD Father   . Stroke Father   . Cancer Father   . Kidney disease Father   . Heart  disease Father   . Migraines Sister   . Cancer Maternal Aunt        Breast Cancer  . Breast cancer Maternal Aunt   . Cancer Paternal Aunt 40       Breast Cancer   ROS: Review of Systems  HENT: Positive for tinnitus.        Positive for dry mouth. Positive for mouth sores.  Eyes:       Positive for wearing glasses or contacts.  Cardiovascular: Negative for orthopnea.  Gastrointestinal: Positive for heartburn.  Musculoskeletal: Positive for back pain, joint pain and myalgias.  Skin:       Positive for skin dryness.   PHYSICAL EXAM: Blood pressure 132/82, pulse 85, height 5' 9"  (1.753 m), weight 288 lb (130.6 kg), SpO2 96 %. Body mass index is 42.53 kg/m. Physical Exam Vitals signs reviewed.  Constitutional:      Appearance: Normal appearance. She is well-developed. She is obese.  HENT:     Head: Normocephalic and atraumatic.     Nose: Nose normal.  Eyes:     General: No scleral icterus. Neck:     Musculoskeletal: Normal range of motion.  Cardiovascular:     Rate and Rhythm: Normal rate and regular rhythm.  Pulmonary:     Effort: Pulmonary effort is normal. No respiratory distress.  Abdominal:     Palpations: Abdomen is soft.     Tenderness: There is no abdominal tenderness.  Musculoskeletal: Normal range of  motion.     Comments: Range of motion normal in all four extremities.  Skin:    General: Skin is warm and dry.  Neurological:     Mental Status: She is alert and oriented to person, place, and time.     Coordination: Coordination normal.  Psychiatric:        Mood and Affect: Mood and affect normal.        Behavior: Behavior normal.   RECENT LABS AND TESTS: BMET    Component Value Date/Time   NA 138 01/04/2011 0919   K 4.4 01/04/2011 0919   CL 103 01/04/2011 0919   CO2 24 01/04/2011 0919   GLUCOSE 98 01/04/2011 0919   BUN 14 01/04/2011 0919   CREATININE 1.08 01/04/2011 0919   CALCIUM 9.8 01/04/2011 0919   GFRNONAA 59 (L) 06/15/2009 1140   GFRAA  06/15/2009 1140    >60        The eGFR has been calculated using the MDRD equation. This calculation has not been validated in all clinical situations. eGFR's persistently <60 mL/min signify possible Chronic Kidney Disease.   No results found for: HGBA1C No results found for: INSULIN CBC    Component Value Date/Time   WBC 3.9 01/04/2011 0919   WBC 4.2 06/15/2009 1140   RBC 4.03 01/04/2011 0919   RBC 3.88 06/15/2009 1140   HGB 12.8 03/06/2012 1012   HGB 12.3 01/04/2011 0919   HCT 35.5 01/04/2011 0919   PLT 206 01/04/2011 0919   MCV 88.0 01/04/2011 0919   MCH 30.5 01/04/2011 0919   MCHC 34.6 01/04/2011 0919   MCHC 33.8 06/15/2009 1140   RDW 14.1 01/04/2011 0919   LYMPHSABS 1.4 01/04/2011 0919   MONOABS 0.2 01/04/2011 0919   EOSABS 0.1 01/04/2011 0919   BASOSABS 0.0 01/04/2011 0919   Iron/TIBC/Ferritin/ %Sat No results found for: IRON, TIBC, FERRITIN, IRONPCTSAT Lipid Panel  No results found for: CHOL, TRIG, HDL, CHOLHDL, VLDL, LDLCALC, LDLDIRECT Hepatic Function Panel     Component Value  Date/Time   PROT 6.9 12/26/2016 0852   ALBUMIN 4.7 01/04/2011 0919   AST 31 01/04/2011 0919   ALT 29 01/04/2011 0919   ALKPHOS 91 01/04/2011 0919   BILITOT 0.3 01/04/2011 0919      Component Value Date/Time   TSH 3.216  03/29/2006 1058   No results found for: Vitamin D  ECG sinus rhythm with a rate of 90 BPM, low voltage in precordial leads, poor R-wave progression - may be secondary to pulmonary disease, consider old anterior infarct.   INDIRECT CALORIMETER done today shows a VO2 of 333 and a REE of 2318. Her calculated basal metabolic rate is 4996 thus her basal metabolic rate is better than expected.  OBESITY BEHAVIORAL INTERVENTION VISIT  Today's visit was #1  Starting weight: 288 lbs Starting date: 06/18/2018 Today's weight: 288 lbs Today's date: 06/18/2018 Total lbs lost to date: 0    06/18/2018  Height 5' 9"  (1.753 m)  Weight 288 lb (130.6 kg)  BMI (Calculated) 42.51  BLOOD PRESSURE - SYSTOLIC 924  BLOOD PRESSURE - DIASTOLIC 82  Waist Measurement  51 inches   Body Fat % 51.2 %  Total Body Water (lbs) 102.4 lbs  RMR 2318   ASK: We discussed the diagnosis of obesity with Barbara Trevino today and Barbara Trevino agreed to give [place] permission to discuss obesity behavioral modification therapy today.  ASSESS: Barbara Trevino has the diagnosis of obesity and her BMI today is 42.51. Dameka is in the action stage of change.   ADVISE: Syrina was educated on the multiple health risks of obesity as well as the benefit of weight loss to improve her health. She was advised of the need for long term treatment and the importance of lifestyle modifications to improve her current health and to decrease her risk of future health problems.  AGREE: Multiple dietary modification options and treatment options were discussed and  Kalayah agreed to follow the recommendations documented in the above note.  ARRANGE: Mirza was educated on the importance of frequent visits to treat obesity as outlined per CMS and USPSTF guidelines and agreed to schedule her next follow up appointment today.  I, Michaelene Song, am acting as transcriptionist for Ilene Qua, MD    I have reviewed the above documentation for accuracy and  completeness, and I agree with the above. - Ilene Qua, MD

## 2018-06-19 LAB — T3: T3, Total: 100 ng/dL (ref 71–180)

## 2018-06-19 LAB — COMPREHENSIVE METABOLIC PANEL
ALT: 40 IU/L — ABNORMAL HIGH (ref 0–32)
AST: 34 IU/L (ref 0–40)
Albumin/Globulin Ratio: 1.9 (ref 1.2–2.2)
Albumin: 4.7 g/dL (ref 3.8–4.9)
Alkaline Phosphatase: 94 IU/L (ref 39–117)
BILIRUBIN TOTAL: 0.3 mg/dL (ref 0.0–1.2)
BUN/Creatinine Ratio: 11 (ref 9–23)
BUN: 12 mg/dL (ref 6–24)
CO2: 26 mmol/L (ref 20–29)
Calcium: 9.8 mg/dL (ref 8.7–10.2)
Chloride: 97 mmol/L (ref 96–106)
Creatinine, Ser: 1.07 mg/dL — ABNORMAL HIGH (ref 0.57–1.00)
GFR calc Af Amer: 67 mL/min/{1.73_m2} (ref >59)
GFR calc non Af Amer: 58 mL/min/{1.73_m2} — ABNORMAL LOW (ref >59)
GLOBULIN, TOTAL: 2.5 g/dL (ref 1.5–4.5)
Glucose: 81 mg/dL (ref 65–99)
POTASSIUM: 4.6 mmol/L (ref 3.5–5.2)
SODIUM: 139 mmol/L (ref 134–144)
Total Protein: 7.2 g/dL (ref 6.0–8.5)

## 2018-06-19 LAB — LIPID PANEL WITH LDL/HDL RATIO
Cholesterol, Total: 176 mg/dL (ref 100–199)
HDL: 61 mg/dL (ref >39)
LDL Calculated: 86 mg/dL (ref 0–99)
LDL/HDL RATIO: 1.4 ratio (ref 0.0–3.2)
Triglycerides: 146 mg/dL (ref 0–149)
VLDL Cholesterol Cal: 29 mg/dL (ref 5–40)

## 2018-06-19 LAB — HEMOGLOBIN A1C
Est. average glucose Bld gHb Est-mCnc: 134 mg/dL
Hgb A1c MFr Bld: 6.3 % — ABNORMAL HIGH (ref 4.8–5.6)

## 2018-06-19 LAB — CBC WITH DIFFERENTIAL
Basophils Absolute: 0 10*3/uL (ref 0.0–0.2)
Basos: 1 %
EOS (ABSOLUTE): 0.1 10*3/uL (ref 0.0–0.4)
Eos: 2 %
Hematocrit: 38.6 % (ref 34.0–46.6)
Hemoglobin: 12.8 g/dL (ref 11.1–15.9)
Immature Grans (Abs): 0 10*3/uL (ref 0.0–0.1)
Immature Granulocytes: 0 %
Lymphocytes Absolute: 2 10*3/uL (ref 0.7–3.1)
Lymphs: 41 %
MCH: 29.4 pg (ref 26.6–33.0)
MCHC: 33.2 g/dL (ref 31.5–35.7)
MCV: 89 fL (ref 79–97)
Monocytes Absolute: 0.3 10*3/uL (ref 0.1–0.9)
Monocytes: 6 %
Neutrophils Absolute: 2.5 10*3/uL (ref 1.4–7.0)
Neutrophils: 50 %
RBC: 4.36 x10E6/uL (ref 3.77–5.28)
RDW: 13.5 % (ref 11.7–15.4)
WBC: 4.9 10*3/uL (ref 3.4–10.8)

## 2018-06-19 LAB — TSH: TSH: 0.759 u[IU]/mL (ref 0.450–4.500)

## 2018-06-19 LAB — INSULIN, RANDOM: INSULIN: 32.2 u[IU]/mL — ABNORMAL HIGH (ref 2.6–24.9)

## 2018-06-19 LAB — FOLATE: Folate: 6.4 ng/mL (ref >3.0)

## 2018-06-19 LAB — VITAMIN B12: Vitamin B-12: 254 pg/mL (ref 232–1245)

## 2018-06-19 LAB — T4, FREE: Free T4: 1.18 ng/dL (ref 0.82–1.77)

## 2018-06-19 LAB — VITAMIN D 25 HYDROXY (VIT D DEFICIENCY, FRACTURES): Vit D, 25-Hydroxy: 27.4 ng/mL — ABNORMAL LOW (ref 30.0–100.0)

## 2018-06-19 MED FILL — MELOXICAM 15 MG TABLET: 15 | 30 days supply | Qty: 30 | Fill #1

## 2018-06-24 ENCOUNTER — Encounter (INDEPENDENT_AMBULATORY_CARE_PROVIDER_SITE_OTHER): Payer: Self-pay

## 2018-07-02 ENCOUNTER — Encounter (INDEPENDENT_AMBULATORY_CARE_PROVIDER_SITE_OTHER): Payer: Self-pay | Admitting: Family Medicine

## 2018-07-02 ENCOUNTER — Ambulatory Visit (INDEPENDENT_AMBULATORY_CARE_PROVIDER_SITE_OTHER): Payer: 59 | Admitting: Family Medicine

## 2018-07-02 ENCOUNTER — Other Ambulatory Visit: Payer: Self-pay

## 2018-07-02 DIAGNOSIS — R7303 Prediabetes: Secondary | ICD-10-CM

## 2018-07-02 DIAGNOSIS — E559 Vitamin D deficiency, unspecified: Secondary | ICD-10-CM | POA: Diagnosis not present

## 2018-07-02 DIAGNOSIS — Z6841 Body Mass Index (BMI) 40.0 and over, adult: Secondary | ICD-10-CM

## 2018-07-03 MED ORDER — VITAMIN D (ERGOCALCIFEROL) 1.25 MG (50000 UNIT) PO CAPS: 50000.0000 [IU] | ORAL_CAPSULE | ORAL | 0 refills | Status: DC

## 2018-07-03 NOTE — Progress Notes (Signed)
Office: [phone]  /  Fax: [phone] TeleHealth Visit:  Barbara Trevino has verbally consented to this TeleHealth visit today. The patient is located at home, the provider is located at the [facility] office. The participants in this visit include the listed provider and patient and provider's assistant. The visit was conducted today via video on Facetime.  HPI:   Chief Complaint: OBESITY Barbara Trevino is here to discuss her progress with her obesity treatment plan. She is on the Category 4 plan and is following her eating plan approximately 80 % of the time. She states she is exercising 0 minutes 0 times per week. [name] hasn't been weighing herself, but she did have a few indulgences today. She has not eaten everything everyday. She has forgotten milk and can't eat all 10 oz of meat. She is getting in around 5-6 oz of meat per day. She denies cravings or hunger.  We were unable to weigh the patient today for this TeleHealth visit. She feels as if she has maintained her weight since her last visit. She has lost 0 lbs since starting treatment with [place].  Vitamin D Deficiency Barbara Trevino has a diagnosis of vitamin D deficiency. She is currently taking OTC Vit D on 1,000 IU daily. She notes fatigue and denies nausea, vomiting or muscle weakness.  Pre-Diabetes Barbara Trevino has a diagnosis of pre-diabetes based on her elevated Hgb A1c of 6.3 (newer diagnosis since last summer, previously 5.9). She was informed this puts her at greater risk of developing diabetes. She is not on medications and continues to work on diet and exercise to decrease risk of diabetes. She denies nausea or hypoglycemia.  ASSESSMENT AND PLAN:  Vitamin D deficiency - Plan: Vitamin D, Ergocalciferol, (DRISDOL) 1.25 MG (50000 UT) CAPS capsule  Prediabetes  Class 3 severe obesity with serious comorbidity and body mass index (BMI) of 40.0 to 44.9 in adult, unspecified obesity type ([place])  PLAN:  Vitamin D Deficiency [name]  was informed that low vitamin D levels contributes to fatigue and are associated with obesity, breast, and colon cancer. [name] agrees to start prescription Vit D @50 ,000 IU every week #4 with no refills and stop OTC Vit D. She will follow up for routine testing of vitamin D, at least 2-3 times per year. She was informed of the risk of over-replacement of vitamin D and agrees to not increase her dose unless she discusses this with [place] first. Candy agrees to follow up with our clinic in 2 weeks.  Pre-Diabetes Barbara Trevino will continue to work on weight loss, exercise, and decreasing simple carbohydrates in her diet to help decrease the risk of diabetes. We dicussed metformin including benefits and risks. She was informed that eating too many simple carbohydrates or too many calories at one sitting increases the likelihood of GI side effects. Barbara Trevino declined metformin for now and a prescription was not written today. We will retest Hgb A1c and insulin in 3 months. Barbara Trevino agrees to follow up with our clinic in 2 weeks as directed to monitor her progress.  Obesity Barbara Trevino is currently in the action stage of change. As such, her goal is to continue with weight loss efforts She has agreed to follow the Category 4 plan Barbara Trevino has been instructed to work up to a goal of 150 minutes of combined cardio and strengthening exercise per week for weight loss and overall health benefits. We discussed the following Behavioral Modification Strategies today: increasing lean protein intake, increasing vegetables and work on meal  planning and easy cooking plans, and planning for success   Barbara Trevino has agreed to follow up with our clinic in 2 weeks. She was informed of the importance of frequent follow up visits to maximize her success with intensive lifestyle modifications for her multiple health conditions.  ALLERGIES: Allergies  Allergen Reactions   Septra [Sulfamethoxazole-Trimethoprim] Nausea And Vomiting    Very sick per  patient    MEDICATIONS: Current Outpatient Medications on File Prior to Visit  Medication Sig Dispense Refill   amitriptyline (ELAVIL) 25 MG tablet Take 25 mg by mouth daily.  3   Calcium-Vitamin D (CALTRATE 600 PLUS-VIT D PO) Take 1,200 mg by mouth daily.     cholecalciferol (VITAMIN D) 1000 UNITS tablet Take 1,000 Units by mouth daily.     gabapentin (NEURONTIN) 300 MG capsule Take 2 capsules (600 mg total) by mouth 2 (two) times daily. 360 capsule 3   ibuprofen (ADVIL,MOTRIN) 200 MG tablet Take 200 mg by mouth every 6 (six) hours as needed.     loratadine (CLARITIN) 10 MG tablet Take 10 mg by mouth daily as needed for allergies.     meloxicam (MOBIC) 15 MG tablet Take 15 mg by mouth daily.     MYRBETRIQ 50 MG TB24 tablet Take 50 mg by mouth daily.  11   pantoprazole (PROTONIX) 40 MG tablet TAKE 1 TABLET BY MOUTH ONCE DAILY FOR ACID REFLUX  3   rosuvastatin (CRESTOR) 20 MG tablet Take 20 mg by mouth daily.     traZODone (DESYREL) 50 MG tablet TAKE 1, 2, OR 3 TABLETS BY MOUTH AT BEDTIME FOR SLEEP DISTURBANCE  3   venlafaxine XR (EFFEXOR-XR) 150 MG 24 hr capsule TAKE 1 CAPSULE BY MOUTH DAILY. 90 capsule 2   No current facility-administered medications on file prior to visit.     PAST MEDICAL HISTORY: Past Medical History:  Diagnosis Date   Achilles tendinosis    Cancer ([place]) 2007   hx rt breast cancer-bladder cancer   Fatty liver    GERD (gastroesophageal reflux disease)    Hyperlipidemia    Neuropathy    Osteoarthritis    Peripheral neuropathy 12/26/2016   Personal history of chemotherapy    Personal history of radiation therapy     PAST SURGICAL HISTORY: Past Surgical History:  Procedure Laterality Date   ABDOMINAL HYSTERECTOMY  2001   vag hyst   BLADDER SUSPENSION  2009   cysto bladder cancer removed   BREAST LUMPECTOMY Right 2007   BREAST REDUCTION SURGERY  2004   BREAST SURGERY  2007   rt lumpectomy-snbx   CALCANEAL OSTEOTOMY  03/06/2012    Procedure: CALCANEAL OSTEOTOMY;  Surgeon: Wylene Simmer, MD;  Location: [place];  Service: Orthopedics;  Laterality: Right;  Right Gastroc Slide; Partial Tibia Tenolysis; Flexor Digitorum Longus Transfer to Navicular; Calcaneal Osteotomy   CARPAL TUNNEL RELEASE  2007   left   CYSTOSCOPY WITH BIOPSY     GASTROCNEMIUS RECESSION  03/06/2012   Procedure: GASTROCNEMIUS SLIDE;  Surgeon: Wylene Simmer, MD;  Location: [place];  Service: Orthopedics;  Laterality: Right;  Right Gastroc Slide; Partial Tibia Tenolysis; Flexor Digitorum Longus Transfer to Navicular; Calcaneal Osteotomy     OOPHORECTOMY     PORT-A-CATH REMOVAL  2007   insertion then taken out   REDUCTION MAMMAPLASTY Bilateral    20 plus years    SOCIAL HISTORY: Social History   Tobacco Use   Smoking status: Never Smoker   Smokeless tobacco: Never Used  Substance Use Topics   Alcohol use: No   Drug use: No    FAMILY HISTORY: Family History  Problem Relation Age of Onset   Cancer Mother 22       Breast cancer   Hypertension Mother    Arthritis Mother    Breast cancer Mother    Hyperlipidemia Mother    Hypertension Father    Arthritis Father    CAD Father    Stroke Father    Cancer Father    Kidney disease Father    Heart disease Father    Migraines Sister    Cancer Maternal Aunt        Breast Cancer   Breast cancer Maternal Aunt    Cancer Paternal Aunt 74       Breast Cancer    ROS: Review of Systems  Constitutional: Positive for malaise/fatigue. Negative for weight loss.  Gastrointestinal: Negative for nausea and vomiting.  Musculoskeletal:       Negative muscle weakness  Endo/Heme/Allergies:       Negative hypoglycemia    PHYSICAL EXAM: Pt in no acute distress  RECENT LABS AND TESTS: BMET    Component Value Date/Time   NA 139 06/18/2018 1356   K 4.6 06/18/2018 1356   CL 97 06/18/2018 1356   CO2 26 06/18/2018 1356   GLUCOSE 81  06/18/2018 1356   GLUCOSE 98 01/04/2011 0919   BUN 12 06/18/2018 1356   CREATININE 1.07 (H) 06/18/2018 1356   CALCIUM 9.8 06/18/2018 1356   GFRNONAA 58 (L) 06/18/2018 1356   GFRAA 67 06/18/2018 1356   Lab Results  Component Value Date   HGBA1C 6.3 (H) 06/18/2018   Lab Results  Component Value Date   INSULIN 32.2 (H) 06/18/2018   CBC    Component Value Date/Time   WBC 4.9 06/18/2018 1356   WBC 3.9 01/04/2011 0919   WBC 4.2 06/15/2009 1140   RBC 4.36 06/18/2018 1356   RBC 4.03 01/04/2011 0919   RBC 3.88 06/15/2009 1140   HGB 12.8 06/18/2018 1356   HGB 12.3 01/04/2011 0919   HCT 38.6 06/18/2018 1356   HCT 35.5 01/04/2011 0919   PLT 206 01/04/2011 0919   MCV 89 06/18/2018 1356   MCV 88.0 01/04/2011 0919   MCH 29.4 06/18/2018 1356   MCH 30.5 01/04/2011 0919   MCHC 33.2 06/18/2018 1356   MCHC 34.6 01/04/2011 0919   MCHC 33.8 06/15/2009 1140   RDW 13.5 06/18/2018 1356   RDW 14.1 01/04/2011 0919   LYMPHSABS 2.0 06/18/2018 1356   LYMPHSABS 1.4 01/04/2011 0919   MONOABS 0.2 01/04/2011 0919   EOSABS 0.1 06/18/2018 1356   BASOSABS 0.0 06/18/2018 1356   BASOSABS 0.0 01/04/2011 0919   Iron/TIBC/Ferritin/ %Sat No results found for: IRON, TIBC, FERRITIN, IRONPCTSAT Lipid Panel     Component Value Date/Time   CHOL 176 06/18/2018 1356   TRIG 146 06/18/2018 1356   HDL 61 06/18/2018 1356   LDLCALC 86 06/18/2018 1356   Hepatic Function Panel     Component Value Date/Time   PROT 7.2 06/18/2018 1356   ALBUMIN 4.7 06/18/2018 1356   AST 34 06/18/2018 1356   ALT 40 (H) 06/18/2018 1356   ALKPHOS 94 06/18/2018 1356   BILITOT 0.3 06/18/2018 1356      Component Value Date/Time   TSH 0.759 06/18/2018 1356   TSH 3.216 03/29/2006 1058   TSH 1.101 02/12/2006 1025      I, Trixie Dredge, am acting as Location manager for [employer],  MD  I have reviewed the above documentation for accuracy and completeness, and I agree with the above. - Ilene Qua, MD

## 2018-07-16 ENCOUNTER — Ambulatory Visit (INDEPENDENT_AMBULATORY_CARE_PROVIDER_SITE_OTHER): Payer: 59 | Admitting: Family Medicine

## 2018-07-16 ENCOUNTER — Other Ambulatory Visit: Payer: Self-pay

## 2018-07-16 ENCOUNTER — Encounter (INDEPENDENT_AMBULATORY_CARE_PROVIDER_SITE_OTHER): Payer: Self-pay | Admitting: Family Medicine

## 2018-07-16 DIAGNOSIS — R7303 Prediabetes: Secondary | ICD-10-CM | POA: Diagnosis not present

## 2018-07-16 DIAGNOSIS — Z6841 Body Mass Index (BMI) 40.0 and over, adult: Secondary | ICD-10-CM | POA: Diagnosis not present

## 2018-07-16 DIAGNOSIS — E559 Vitamin D deficiency, unspecified: Secondary | ICD-10-CM | POA: Diagnosis not present

## 2018-07-16 MED FILL — MELOXICAM 15 MG TABLET: 15 | 90 days supply | Qty: 90 | Fill #0

## 2018-07-16 MED FILL — ROSUVASTATIN CALCIUM 20 MG: 20 | 90 days supply | Qty: 90 | Fill #0

## 2018-07-16 MED FILL — MYRBETRIQ ER 50 MG TABLET: 50 | 90 days supply | Qty: 90 | Fill #0

## 2018-07-16 MED FILL — PANTOPRAZOLE SOD DR 40 MG T: 40 | 90 days supply | Qty: 90 | Fill #0

## 2018-07-16 MED FILL — VIT D2 1.25 MG (50,000 UNIT: 1.25 MG | 28 days supply | Qty: 4 | Fill #0

## 2018-07-22 NOTE — Progress Notes (Signed)
Office: [phone]  /  Fax: [phone] TeleHealth Visit:  Barbara Trevino has verbally consented to this TeleHealth visit today. The patient is located at home, the provider is located at the [employer] [facility] office. The participants in this visit include the listed provider and patient. The visit was conducted today via face time.  HPI:   Chief Complaint: OBESITY Barbara Trevino is here to discuss her progress with her obesity treatment plan. She is on the Category 4 plan and is following her eating plan approximately 75-80 % of the time. She states she is doing some yard work. Barbara Trevino voices that work has been slow, which has led to her eating slightly more than the first 2 weeks. She did have an indulgent Easter. She is starting to get discouraged with slowness of weight loss.  We were unable to weigh the patient today for this TeleHealth visit. She feels as if she has lost 3 lbs since her last visit. She has lost 0 lbs since starting treatment with [place].  Pre-Diabetes Barbara Trevino has a diagnosis of pre-diabetes based on her elevated Hgb A1c of 6.3 and insulin of 32.2. She was informed this puts her at greater risk of developing diabetes. She is not on medications and denies feelings of hunger. She continues to work on diet and exercise to decrease risk of diabetes.   Vitamin D Deficiency Barbara Trevino has a diagnosis of vitamin D deficiency. She is currently taking prescription Vit D. She notes fatigue and denies nausea, vomiting or muscle weakness.  ASSESSMENT AND PLAN:  Prediabetes  Vitamin D deficiency  Class 3 severe obesity with serious comorbidity and body mass index (BMI) of 40.0 to 44.9 in adult, unspecified obesity type ([place])  PLAN:  Pre-Diabetes Barbara Trevino will continue to work on weight loss, exercise, and decreasing simple carbohydrates in her diet to help decrease the risk of diabetes. We dicussed metformin including benefits and risks. She was informed that eating too many simple  carbohydrates or too many calories at one sitting increases the likelihood of GI side effects. Lowella declined metformin for now and a prescription was not written today. We will repeat labs in late June. Zaila agrees to follow up with our clinic in 3 weeks as directed to monitor her progress.  Vitamin D Deficiency Barbara Trevino was informed that low vitamin D levels contributes to fatigue and are associated with obesity, breast, and colon cancer. Barbara Trevino agrees to continue taking prescription Vit D @50 ,000 IU every week, no refill needed. She will follow up for routine testing of vitamin D, at least 2-3 times per year. She was informed of the risk of over-replacement of vitamin D and agrees to not increase her dose unless she discusses this with [place] first. Barbara Trevino agrees to follow up with our clinic in 3 weeks.  Obesity Barbara Trevino is currently in the action stage of change. As such, her goal is to continue with weight loss efforts She has agreed to follow the Category 4 plan with breakfast options Barbara Trevino has been instructed to work up to a goal of 150 minutes of combined cardio and strengthening exercise per week for weight loss and overall health benefits. We discussed the following Behavioral Modification Strategies today: increasing lean protein intake, increasing vegetables and work on meal planning and easy cooking plans, better snacking choices, and planning for success   Philip has agreed to follow up with our clinic in 3 weeks. She was informed of the importance of frequent follow up visits to maximize her  success with intensive lifestyle modifications for her multiple health conditions.  ALLERGIES: Allergies  Allergen Reactions   Septra [Sulfamethoxazole-Trimethoprim] Nausea And Vomiting    Very sick per patient    MEDICATIONS: Current Outpatient Medications on File Prior to Visit  Medication Sig Dispense Refill   amitriptyline (ELAVIL) 25 MG tablet Take 25 mg by mouth daily.  3   Calcium-Vitamin D  (CALTRATE 600 PLUS-VIT D PO) Take 1,200 mg by mouth daily.     cholecalciferol (VITAMIN D) 1000 UNITS tablet Take 1,000 Units by mouth daily.     gabapentin (NEURONTIN) 300 MG capsule Take 2 capsules (600 mg total) by mouth 2 (two) times daily. 360 capsule 3   ibuprofen (ADVIL,MOTRIN) 200 MG tablet Take 200 mg by mouth every 6 (six) hours as needed.     loratadine (CLARITIN) 10 MG tablet Take 10 mg by mouth daily as needed for allergies.     meloxicam (MOBIC) 15 MG tablet Take 15 mg by mouth daily.     MYRBETRIQ 50 MG TB24 tablet Take 50 mg by mouth daily.  11   pantoprazole (PROTONIX) 40 MG tablet TAKE 1 TABLET BY MOUTH ONCE DAILY FOR ACID REFLUX  3   rosuvastatin (CRESTOR) 20 MG tablet Take 20 mg by mouth daily.     traZODone (DESYREL) 50 MG tablet TAKE 1, 2, OR 3 TABLETS BY MOUTH AT BEDTIME FOR SLEEP DISTURBANCE  3   venlafaxine XR (EFFEXOR-XR) 150 MG 24 hr capsule TAKE 1 CAPSULE BY MOUTH DAILY. 90 capsule 2   Vitamin D, Ergocalciferol, (DRISDOL) 1.25 MG (50000 UT) CAPS capsule Take 1 capsule (50,000 Units total) by mouth every 7 (seven) days. 4 capsule 0   No current facility-administered medications on file prior to visit.     PAST MEDICAL HISTORY: Past Medical History:  Diagnosis Date   Achilles tendinosis    Cancer ([place]) 2007   hx rt breast cancer-bladder cancer   Fatty liver    GERD (gastroesophageal reflux disease)    Hyperlipidemia    Neuropathy    Osteoarthritis    Peripheral neuropathy 12/26/2016   Personal history of chemotherapy    Personal history of radiation therapy     PAST SURGICAL HISTORY: Past Surgical History:  Procedure Laterality Date   ABDOMINAL HYSTERECTOMY  2001   vag hyst   BLADDER SUSPENSION  2009   cysto bladder cancer removed   BREAST LUMPECTOMY Right 2007   BREAST REDUCTION SURGERY  2004   BREAST SURGERY  2007   rt lumpectomy-snbx   CALCANEAL OSTEOTOMY  03/06/2012   Procedure: CALCANEAL OSTEOTOMY;  Surgeon: Wylene Simmer, MD;  Location: [place];  Service: Orthopedics;  Laterality: Right;  Right Gastroc Slide; Partial Tibia Tenolysis; Flexor Digitorum Longus Transfer to Navicular; Calcaneal Osteotomy   CARPAL TUNNEL RELEASE  2007   left   CYSTOSCOPY WITH BIOPSY     GASTROCNEMIUS RECESSION  03/06/2012   Procedure: GASTROCNEMIUS SLIDE;  Surgeon: Wylene Simmer, MD;  Location: [place];  Service: Orthopedics;  Laterality: Right;  Right Gastroc Slide; Partial Tibia Tenolysis; Flexor Digitorum Longus Transfer to Navicular; Calcaneal Osteotomy     OOPHORECTOMY     PORT-A-CATH REMOVAL  2007   insertion then taken out   REDUCTION MAMMAPLASTY Bilateral    20 plus years    SOCIAL HISTORY: Social History   Tobacco Use   Smoking status: Never Smoker   Smokeless tobacco: Never Used  Substance Use Topics   Alcohol use: No  Drug use: No    FAMILY HISTORY: Family History  Problem Relation Age of Onset   Cancer Mother 51       Breast cancer   Hypertension Mother    Arthritis Mother    Breast cancer Mother    Hyperlipidemia Mother    Hypertension Father    Arthritis Father    CAD Father    Stroke Father    Cancer Father    Kidney disease Father    Heart disease Father    Migraines Sister    Cancer Maternal Aunt        Breast Cancer   Breast cancer Maternal Aunt    Cancer Paternal Aunt 67       Breast Cancer    ROS: Review of Systems  Constitutional: Positive for malaise/fatigue and weight loss.  Gastrointestinal: Negative for nausea and vomiting.  Musculoskeletal:       Negative muscle weakness    PHYSICAL EXAM: Pt in no acute distress  RECENT LABS AND TESTS: BMET    Component Value Date/Time   NA 139 06/18/2018 1356   K 4.6 06/18/2018 1356   CL 97 06/18/2018 1356   CO2 26 06/18/2018 1356   GLUCOSE 81 06/18/2018 1356   GLUCOSE 98 01/04/2011 0919   BUN 12 06/18/2018 1356   CREATININE 1.07 (H) 06/18/2018 1356   CALCIUM  9.8 06/18/2018 1356   GFRNONAA 58 (L) 06/18/2018 1356   GFRAA 67 06/18/2018 1356   Lab Results  Component Value Date   HGBA1C 6.3 (H) 06/18/2018   Lab Results  Component Value Date   INSULIN 32.2 (H) 06/18/2018   CBC    Component Value Date/Time   WBC 4.9 06/18/2018 1356   WBC 3.9 01/04/2011 0919   WBC 4.2 06/15/2009 1140   RBC 4.36 06/18/2018 1356   RBC 4.03 01/04/2011 0919   RBC 3.88 06/15/2009 1140   HGB 12.8 06/18/2018 1356   HGB 12.3 01/04/2011 0919   HCT 38.6 06/18/2018 1356   HCT 35.5 01/04/2011 0919   PLT 206 01/04/2011 0919   MCV 89 06/18/2018 1356   MCV 88.0 01/04/2011 0919   MCH 29.4 06/18/2018 1356   MCH 30.5 01/04/2011 0919   MCHC 33.2 06/18/2018 1356   MCHC 34.6 01/04/2011 0919   MCHC 33.8 06/15/2009 1140   RDW 13.5 06/18/2018 1356   RDW 14.1 01/04/2011 0919   LYMPHSABS 2.0 06/18/2018 1356   LYMPHSABS 1.4 01/04/2011 0919   MONOABS 0.2 01/04/2011 0919   EOSABS 0.1 06/18/2018 1356   BASOSABS 0.0 06/18/2018 1356   BASOSABS 0.0 01/04/2011 0919   Iron/TIBC/Ferritin/ %Sat No results found for: IRON, TIBC, FERRITIN, IRONPCTSAT Lipid Panel     Component Value Date/Time   CHOL 176 06/18/2018 1356   TRIG 146 06/18/2018 1356   HDL 61 06/18/2018 1356   LDLCALC 86 06/18/2018 1356   Hepatic Function Panel     Component Value Date/Time   PROT 7.2 06/18/2018 1356   ALBUMIN 4.7 06/18/2018 1356   AST 34 06/18/2018 1356   ALT 40 (H) 06/18/2018 1356   ALKPHOS 94 06/18/2018 1356   BILITOT 0.3 06/18/2018 1356      Component Value Date/Time   TSH 0.759 06/18/2018 1356   TSH 3.216 03/29/2006 1058   TSH 1.101 02/12/2006 1025      I, Trixie Dredge, am acting as Location manager for Ilene Qua, MD  I have reviewed the above documentation for accuracy and completeness, and I agree with the above. - Ilene Qua,  MD

## 2018-07-23 ENCOUNTER — Telehealth: Payer: Self-pay | Admitting: Neurology

## 2018-07-23 NOTE — Telephone Encounter (Signed)
This patient is a [facility] patient and has never seen an NP before. Patient can be called and offered a virtual visit with Judson Roch, NP.

## 2018-07-31 ENCOUNTER — Telehealth: Payer: Self-pay

## 2018-07-31 NOTE — Telephone Encounter (Signed)
Spoke with the patient and I was able to get verbal consent to do a [email or web address] visit with [name] on 08/06/2018 and to file her insurance. E-mail, mobile number and carrier have been confirmed and sent.

## 2018-08-04 ENCOUNTER — Ambulatory Visit (INDEPENDENT_AMBULATORY_CARE_PROVIDER_SITE_OTHER): Payer: 59 | Admitting: Family Medicine

## 2018-08-04 ENCOUNTER — Encounter (INDEPENDENT_AMBULATORY_CARE_PROVIDER_SITE_OTHER): Payer: Self-pay | Admitting: Family Medicine

## 2018-08-04 ENCOUNTER — Other Ambulatory Visit: Payer: Self-pay

## 2018-08-04 DIAGNOSIS — Z6841 Body Mass Index (BMI) 40.0 and over, adult: Secondary | ICD-10-CM | POA: Diagnosis not present

## 2018-08-04 DIAGNOSIS — E559 Vitamin D deficiency, unspecified: Secondary | ICD-10-CM | POA: Diagnosis not present

## 2018-08-04 DIAGNOSIS — R7303 Prediabetes: Secondary | ICD-10-CM | POA: Diagnosis not present

## 2018-08-04 NOTE — Progress Notes (Signed)
Office: [phone]  /  Fax: [phone] TeleHealth Visit:  Barbara Trevino has verbally consented to this TeleHealth visit today. The patient is located at home, the provider is located at the [facility] office. The participants in this visit include the listed provider and patient. The visit was conducted today via face time.  HPI:   Chief Complaint: OBESITY Barbara Trevino is here to discuss her progress with her obesity treatment plan. She is on the Category 4 plan with breakfast options and is following her eating plan approximately 50 % of the time. She states she is exercising 0 minutes 0 times per week. Barbara Trevino has been struggling with staying on the plan, and is often eating indulgent foods. She wants  To get back on track and follow the meal plan.  We were unable to weigh the patient today for this TeleHealth visit. She feels as if she has maintained her weight since her last visit. She has lost 0 lbs since starting treatment with [place].  Vitamin D Deficiency Barbara Trevino has a diagnosis of vitamin D deficiency. She is currently taking prescription Vit D. She notes fatigue and denies nausea, vomiting or muscle weakness.  Pre-Diabetes Barbara Trevino has a diagnosis of pre-diabetes based on her elevated Hgb A1c at 6.3 and insulin at 32.2. She was informed this puts her at greater risk of developing diabetes. She is not taking metformin currently and notes carbohydrate cravings. She continues to work on diet and exercise to decrease risk of diabetes.   ASSESSMENT AND PLAN:  Vitamin D deficiency  Prediabetes  Class 3 severe obesity with serious comorbidity and body mass index (BMI) of 40.0 to 44.9 in adult, unspecified obesity type ([place])  PLAN:  Vitamin D Deficiency Barbara Trevino was informed that low vitamin D levels contributes to fatigue and are associated with obesity, breast, and colon cancer. Barbara Trevino agrees to continue taking prescription Vit D @50 ,000 IU every week and will follow up for routine  testing of vitamin D, at least 2-3 times per year. She was informed of the risk of over-replacement of vitamin D and agrees to not increase her dose unless she discusses this with [place] first. Barbara Trevino agrees to follow up with our clinic in 2 weeks.  Pre-Diabetes Barbara Trevino will continue to work on weight loss, exercise, and decreasing simple carbohydrates in her diet to help decrease the risk of diabetes. We dicussed metformin including benefits and risks. She was informed that eating too many simple carbohydrates or too many calories at one sitting increases the likelihood of GI side effects. Marki declined metformin for now and a prescription was not written today. We will repeat labs at the end of June. Barbara Trevino agrees to follow up with our clinic in 2 weeks as directed to monitor her progress.  Obesity Barbara Trevino is currently in the action stage of change. As such, her goal is to continue with weight loss efforts She has agreed to follow the Category 4 plan Barbara Trevino has been instructed to work up to a goal of 150 minutes of combined cardio and strengthening exercise per week or start physical activity for 10-15 minutes 3 times per week for weight loss and overall health benefits. We discussed the following Behavioral Modification Strategies today: increasing lean protein intake, increasing vegetables and work on meal planning and easy cooking plans, better snacking choices, and planning for success   Barbara Trevino has agreed to follow up with our clinic in 2 weeks. She was informed of the importance of frequent follow up  visits to maximize her success with intensive lifestyle modifications for her multiple health conditions.  ALLERGIES: Allergies  Allergen Reactions   Septra [Sulfamethoxazole-Trimethoprim] Nausea And Vomiting    Very sick per patient    MEDICATIONS: Current Outpatient Medications on File Prior to Visit  Medication Sig Dispense Refill   amitriptyline (ELAVIL) 25 MG tablet Take 25 mg by mouth daily.   3   Calcium-Vitamin D (CALTRATE 600 PLUS-VIT D PO) Take 1,200 mg by mouth daily.     cholecalciferol (VITAMIN D) 1000 UNITS tablet Take 1,000 Units by mouth daily.     gabapentin (NEURONTIN) 300 MG capsule Take 2 capsules (600 mg total) by mouth 2 (two) times daily. 360 capsule 3   ibuprofen (ADVIL,MOTRIN) 200 MG tablet Take 200 mg by mouth every 6 (six) hours as needed.     loratadine (CLARITIN) 10 MG tablet Take 10 mg by mouth daily as needed for allergies.     meloxicam (MOBIC) 15 MG tablet Take 15 mg by mouth daily.     MYRBETRIQ 50 MG TB24 tablet Take 50 mg by mouth daily.  11   pantoprazole (PROTONIX) 40 MG tablet TAKE 1 TABLET BY MOUTH ONCE DAILY FOR ACID REFLUX  3   rosuvastatin (CRESTOR) 20 MG tablet Take 20 mg by mouth daily.     traZODone (DESYREL) 50 MG tablet TAKE 1, 2, OR 3 TABLETS BY MOUTH AT BEDTIME FOR SLEEP DISTURBANCE  3   venlafaxine XR (EFFEXOR-XR) 150 MG 24 hr capsule TAKE 1 CAPSULE BY MOUTH DAILY. 90 capsule 2   Vitamin D, Ergocalciferol, (DRISDOL) 1.25 MG (50000 UT) CAPS capsule Take 1 capsule (50,000 Units total) by mouth every 7 (seven) days. 4 capsule 0   No current facility-administered medications on file prior to visit.     PAST MEDICAL HISTORY: Past Medical History:  Diagnosis Date   Achilles tendinosis    Cancer ([place]) 2007   hx rt breast cancer-bladder cancer   Fatty liver    GERD (gastroesophageal reflux disease)    Hyperlipidemia    Neuropathy    Osteoarthritis    Peripheral neuropathy 12/26/2016   Personal history of chemotherapy    Personal history of radiation therapy     PAST SURGICAL HISTORY: Past Surgical History:  Procedure Laterality Date   ABDOMINAL HYSTERECTOMY  2001   vag hyst   BLADDER SUSPENSION  2009   cysto bladder cancer removed   BREAST LUMPECTOMY Right 2007   BREAST REDUCTION SURGERY  2004   BREAST SURGERY  2007   rt lumpectomy-snbx   CALCANEAL OSTEOTOMY  03/06/2012   Procedure: CALCANEAL  OSTEOTOMY;  Surgeon: Wylene Simmer, MD;  Location: [place];  Service: Orthopedics;  Laterality: Right;  Right Gastroc Slide; Partial Tibia Tenolysis; Flexor Digitorum Longus Transfer to Navicular; Calcaneal Osteotomy   CARPAL TUNNEL RELEASE  2007   left   CYSTOSCOPY WITH BIOPSY     GASTROCNEMIUS RECESSION  03/06/2012   Procedure: GASTROCNEMIUS SLIDE;  Surgeon: Wylene Simmer, MD;  Location: [place];  Service: Orthopedics;  Laterality: Right;  Right Gastroc Slide; Partial Tibia Tenolysis; Flexor Digitorum Longus Transfer to Navicular; Calcaneal Osteotomy     OOPHORECTOMY     PORT-A-CATH REMOVAL  2007   insertion then taken out   REDUCTION MAMMAPLASTY Bilateral    20 plus years    SOCIAL HISTORY: Social History   Tobacco Use   Smoking status: Never Smoker   Smokeless tobacco: Never Used  Substance Use Topics   Alcohol  use: No   Drug use: No    FAMILY HISTORY: Family History  Problem Relation Age of Onset   Cancer Mother 58       Breast cancer   Hypertension Mother    Arthritis Mother    Breast cancer Mother    Hyperlipidemia Mother    Hypertension Father    Arthritis Father    CAD Father    Stroke Father    Cancer Father    Kidney disease Father    Heart disease Father    Migraines Sister    Cancer Maternal Aunt        Breast Cancer   Breast cancer Maternal Aunt    Cancer Paternal Aunt 48       Breast Cancer    ROS: Review of Systems  Constitutional: Positive for malaise/fatigue. Negative for weight loss.  Gastrointestinal: Negative for nausea and vomiting.  Musculoskeletal:       Negative muscle weakness    PHYSICAL EXAM: Pt in no acute distress  RECENT LABS AND TESTS: BMET    Component Value Date/Time   NA 139 06/18/2018 1356   K 4.6 06/18/2018 1356   CL 97 06/18/2018 1356   CO2 26 06/18/2018 1356   GLUCOSE 81 06/18/2018 1356   GLUCOSE 98 01/04/2011 0919   BUN 12 06/18/2018 1356   CREATININE  1.07 (H) 06/18/2018 1356   CALCIUM 9.8 06/18/2018 1356   GFRNONAA 58 (L) 06/18/2018 1356   GFRAA 67 06/18/2018 1356   Lab Results  Component Value Date   HGBA1C 6.3 (H) 06/18/2018   Lab Results  Component Value Date   INSULIN 32.2 (H) 06/18/2018   CBC    Component Value Date/Time   WBC 4.9 06/18/2018 1356   WBC 3.9 01/04/2011 0919   WBC 4.2 06/15/2009 1140   RBC 4.36 06/18/2018 1356   RBC 4.03 01/04/2011 0919   RBC 3.88 06/15/2009 1140   HGB 12.8 06/18/2018 1356   HGB 12.3 01/04/2011 0919   HCT 38.6 06/18/2018 1356   HCT 35.5 01/04/2011 0919   PLT 206 01/04/2011 0919   MCV 89 06/18/2018 1356   MCV 88.0 01/04/2011 0919   MCH 29.4 06/18/2018 1356   MCH 30.5 01/04/2011 0919   MCHC 33.2 06/18/2018 1356   MCHC 34.6 01/04/2011 0919   MCHC 33.8 06/15/2009 1140   RDW 13.5 06/18/2018 1356   RDW 14.1 01/04/2011 0919   LYMPHSABS 2.0 06/18/2018 1356   LYMPHSABS 1.4 01/04/2011 0919   MONOABS 0.2 01/04/2011 0919   EOSABS 0.1 06/18/2018 1356   BASOSABS 0.0 06/18/2018 1356   BASOSABS 0.0 01/04/2011 0919   Iron/TIBC/Ferritin/ %Sat No results found for: IRON, TIBC, FERRITIN, IRONPCTSAT Lipid Panel     Component Value Date/Time   CHOL 176 06/18/2018 1356   TRIG 146 06/18/2018 1356   HDL 61 06/18/2018 1356   LDLCALC 86 06/18/2018 1356   Hepatic Function Panel     Component Value Date/Time   PROT 7.2 06/18/2018 1356   ALBUMIN 4.7 06/18/2018 1356   AST 34 06/18/2018 1356   ALT 40 (H) 06/18/2018 1356   ALKPHOS 94 06/18/2018 1356   BILITOT 0.3 06/18/2018 1356      Component Value Date/Time   TSH 0.759 06/18/2018 1356   TSH 3.216 03/29/2006 1058   TSH 1.101 02/12/2006 1025      I, Trixie Dredge, am acting as Location manager for Ilene Qua, MD  I have reviewed the above documentation for accuracy and completeness, and I agree with  the above. - Ilene Qua, MD

## 2018-08-05 NOTE — Progress Notes (Signed)
PATIENT: Barbara Trevino DOB: 11-Aug-1960  REASON FOR VISIT: follow up HISTORY FROM: patient  Virtual Visit via Video Note  I connected with Rueben Bash on 08/06/18 at  2:30 PM EDT by a video enabled telemedicine application located remotely at [facility] and verified that I am speaking with the correct person using two identifiers who was located at their own home.   I discussed the limitations of evaluation and management by telemedicine and the availability of in person appointments. The patient expressed understanding and agreed to proceed.   PATIENT: Barbara Trevino DOB: June 29, 1960  REASON FOR VISIT: follow up HISTORY FROM: patient  HISTORY OF PRESENT ILLNESS: Today 08/05/18 :  Barbara Trevino is a 58 year old female with a history of peripheral neuropathy.  She is following up today with a virtual visit.  She reports that her neuropathy has remained the same.  She states that the numbness in the feet is what bothers her most. She states it is primarily located in the toes and the ball of the foot.  She does not describe any pain or numbness that goes up the leg.  She states on occasion she will have burning and tingling sensation in the toes.  She does feel that gabapentin has helped with that.  Reports that she consistently takes gabapentin at bedtime.  However sometimes she forgets the morning dose.  She denies any changes with her gait or balance.  She returns today for evaluation.  HISTORY (copied from Dr. Tobey Grim note)  04/11/17: Barbara Trevino is a 58 year old right-handed white female with a history of breast cancer previously status post chemotherapy.  The patient developed numbness in the feet after chemotherapy stopped.  She has had some progression of her foot numbness over 11 years slowly.  The patient has been subjected to an EMG and nerve conduction study that did confirm the presence of a peripheral neuropathy.  She has been started on gabapentin for the  discomfort.  She currently is on 300 mg in the morning and 600 mg in the evening.  She sleeps fairly well, she takes trazodone at night as well for sleep.  She may have some restless legs symptoms at times.  The patient still has some burning sensations in the feet, she is bothered by the numbness in the feet as well.  She returns to this office for an evaluation.   REVIEW OF SYSTEMS: Out of a complete 14 system review of symptoms, the patient complains only of the following symptoms, and all other reviewed systems are negative.  See HPI  ALLERGIES: Allergies  Allergen Reactions  . Septra [Sulfamethoxazole-Trimethoprim] Nausea And Vomiting    Very sick per patient    HOME MEDICATIONS: Outpatient Medications Prior to Visit  Medication Sig Dispense Refill  . amitriptyline (ELAVIL) 25 MG tablet Take 25 mg by mouth daily.  3  . Calcium-Vitamin D (CALTRATE 600 PLUS-VIT D PO) Take 1,200 mg by mouth daily.    . cholecalciferol (VITAMIN D) 1000 UNITS tablet Take 1,000 Units by mouth daily.    Marland Kitchen gabapentin (NEURONTIN) 300 MG capsule Take 2 capsules (600 mg total) by mouth 2 (two) times daily. 360 capsule 3  . ibuprofen (ADVIL,MOTRIN) 200 MG tablet Take 200 mg by mouth every 6 (six) hours as needed.    . loratadine (CLARITIN) 10 MG tablet Take 10 mg by mouth daily as needed for allergies.    . meloxicam (MOBIC) 15 MG tablet Take 15 mg by mouth  daily.    Marland Kitchen MYRBETRIQ 50 MG TB24 tablet Take 50 mg by mouth daily.  11  . pantoprazole (PROTONIX) 40 MG tablet TAKE 1 TABLET BY MOUTH ONCE DAILY FOR ACID REFLUX  3  . rosuvastatin (CRESTOR) 20 MG tablet Take 20 mg by mouth daily.    . traZODone (DESYREL) 50 MG tablet TAKE 1, 2, OR 3 TABLETS BY MOUTH AT BEDTIME FOR SLEEP DISTURBANCE  3  . venlafaxine XR (EFFEXOR-XR) 150 MG 24 hr capsule TAKE 1 CAPSULE BY MOUTH DAILY. 90 capsule 2  . Vitamin D, Ergocalciferol, (DRISDOL) 1.25 MG (50000 UT) CAPS capsule Take 1 capsule (50,000 Units total) by mouth every 7  (seven) days. 4 capsule 0   No facility-administered medications prior to visit.     PAST MEDICAL HISTORY: Past Medical History:  Diagnosis Date  . Achilles tendinosis   . Cancer [facility]) 2007   hx rt breast cancer-bladder cancer  . Fatty liver   . GERD (gastroesophageal reflux disease)   . Hyperlipidemia   . Neuropathy   . Osteoarthritis   . Peripheral neuropathy 12/26/2016  . Personal history of chemotherapy   . Personal history of radiation therapy     PAST SURGICAL HISTORY: Past Surgical History:  Procedure Laterality Date  . ABDOMINAL HYSTERECTOMY  2001   vag hyst  . BLADDER SUSPENSION  2009   cysto bladder cancer removed  . BREAST LUMPECTOMY Right 2007  . BREAST REDUCTION SURGERY  2004  . BREAST SURGERY  2007   rt lumpectomy-snbx  . CALCANEAL OSTEOTOMY  03/06/2012   Procedure: CALCANEAL OSTEOTOMY;  Surgeon: Wylene Simmer, MD;  Location: [place];  Service: Orthopedics;  Laterality: Right;  Right Gastroc Slide; Partial Tibia Tenolysis; Flexor Digitorum Longus Transfer to Navicular; Calcaneal Osteotomy  . CARPAL TUNNEL RELEASE  2007   left  . CYSTOSCOPY WITH BIOPSY    . GASTROCNEMIUS RECESSION  03/06/2012   Procedure: GASTROCNEMIUS SLIDE;  Surgeon: Wylene Simmer, MD;  Location: [place];  Service: Orthopedics;  Laterality: Right;  Right Gastroc Slide; Partial Tibia Tenolysis; Flexor Digitorum Longus Transfer to Navicular; Calcaneal Osteotomy    . OOPHORECTOMY    . PORT-A-CATH REMOVAL  2007   insertion then taken out  . REDUCTION MAMMAPLASTY Bilateral    20 plus years    FAMILY HISTORY: Family History  Problem Relation Age of Onset  . Cancer Mother 25       Breast cancer  . Hypertension Mother   . Arthritis Mother   . Breast cancer Mother   . Hyperlipidemia Mother   . Hypertension Father   . Arthritis Father   . CAD Father   . Stroke Father   . Cancer Father   . Kidney disease Father   . Heart disease Father   . Migraines  Sister   . Cancer Maternal Aunt        Breast Cancer  . Breast cancer Maternal Aunt   . Cancer Paternal Aunt 73       Breast Cancer    SOCIAL HISTORY: Social History   Socioeconomic History  . Marital status: Divorced    Spouse name: Not on file  . Number of children: 3  . Years of education: Associates  . Highest education level: Not on file  Occupational History  . Occupation: [employer]  Social Needs  . Financial resource strain: Not on file  . Food insecurity:    Worry: Not on file    Inability: Not on file  .  Transportation needs:    Medical: Not on file    Non-medical: Not on file  Tobacco Use  . Smoking status: Never Smoker  . Smokeless tobacco: Never Used  Substance and Sexual Activity  . Alcohol use: No  . Drug use: No  . Sexual activity: Not Currently    Birth control/protection: Post-menopausal, Surgical  Lifestyle  . Physical activity:    Days per week: Not on file    Minutes per session: Not on file  . Stress: Not on file  Relationships  . Social connections:    Talks on phone: Not on file    Gets together: Not on file    Attends religious service: Not on file    Active member of club or organization: Not on file    Attends meetings of clubs or organizations: Not on file    Relationship status: Not on file  . Intimate partner violence:    Fear of current or ex partner: Not on file    Emotionally abused: Not on file    Physically abused: Not on file    Forced sexual activity: Not on file  Other Topics Concern  . Not on file  Social History Narrative   Lives with son   Caffeine use: Tea every other day   Diet pepsi   Right handed       PHYSICAL EXAM   Generalized: Well developed, in no acute distress   Neurological examination  Mentation: Alert oriented to time, place, history taking. Follows all commands speech and language fluent Cranial nerve II-XII:Extraocular movements were full. Facial s symmetry noted. Uvula tongue  midline. Head turning and shoulder shrug  were normal and symmetric. Motor: Good strength throughout subjectively per patient Sensory: Sensory testing is intact to soft touch on all 4 extremities subjectively per patient Coordination: Cerebellar testing reveals good finger-nose-finger  Gait and station: Gait is normal.  Reflexes: UTA  DIAGNOSTIC DATA (LABS, IMAGING, TESTING) - I reviewed patient records, labs, notes, testing and imaging myself where available.  Lab Results  Component Value Date   WBC 4.9 06/18/2018   HGB 12.8 06/18/2018   HCT 38.6 06/18/2018   MCV 89 06/18/2018   PLT 206 01/04/2011      Component Value Date/Time   NA 139 06/18/2018 1356   K 4.6 06/18/2018 1356   CL 97 06/18/2018 1356   CO2 26 06/18/2018 1356   GLUCOSE 81 06/18/2018 1356   GLUCOSE 98 01/04/2011 0919   BUN 12 06/18/2018 1356   CREATININE 1.07 (H) 06/18/2018 1356   CALCIUM 9.8 06/18/2018 1356   PROT 7.2 06/18/2018 1356   ALBUMIN 4.7 06/18/2018 1356   AST 34 06/18/2018 1356   ALT 40 (H) 06/18/2018 1356   ALKPHOS 94 06/18/2018 1356   BILITOT 0.3 06/18/2018 1356   GFRNONAA 58 (L) 06/18/2018 1356   GFRAA 67 06/18/2018 1356   Lab Results  Component Value Date   CHOL 176 06/18/2018   HDL 61 06/18/2018   LDLCALC 86 06/18/2018   TRIG 146 06/18/2018   Lab Results  Component Value Date   HGBA1C 6.3 (H) 06/18/2018   Lab Results  Component Value Date   VITAMINB12 254 06/18/2018   Lab Results  Component Value Date   TSH 0.759 06/18/2018      ASSESSMENT AND PLAN 58 y.o. year old female  has a past medical history of Achilles tendinosis, Cancer ([place]) (2007), Fatty liver, GERD (gastroesophageal reflux disease), Hyperlipidemia, Neuropathy, Osteoarthritis, Peripheral neuropathy (12/26/2016), Personal history of  chemotherapy, and Personal history of radiation therapy. here with:  1.  Peripheral neuropathy  Overall the patient has remained stable.  She will continue on gabapentin.  Advised  that she can split the morning dose and take 1 tablet in the morning and 1 at lunch if it is causing her to be drowsy.  She will continue taking 600 mg at bedtime.  She is advised that if her symptoms worsen or she develops new symptoms she should let us know.  She will follow-up in 6 months or sooner if needed.   I spent approximately 15 minutes reviewing the patient's chart, discussing symptoms and plan of care.   Ward Givens, MSN, NP-C 08/05/2018, 3:32 PM [facility] [address], [place], [place] [address] [phone]

## 2018-08-06 ENCOUNTER — Other Ambulatory Visit: Payer: Self-pay

## 2018-08-06 ENCOUNTER — Ambulatory Visit (INDEPENDENT_AMBULATORY_CARE_PROVIDER_SITE_OTHER): Payer: 59 | Admitting: Adult Health

## 2018-08-06 ENCOUNTER — Encounter

## 2018-08-06 ENCOUNTER — Encounter: Payer: Self-pay | Admitting: Adult Health

## 2018-08-06 DIAGNOSIS — G603 Idiopathic progressive neuropathy: Secondary | ICD-10-CM | POA: Diagnosis not present

## 2018-08-06 NOTE — Progress Notes (Signed)
I have read the note, and I agree with the clinical assessment and plan.  Charles K Willis   

## 2018-08-18 ENCOUNTER — Ambulatory Visit (INDEPENDENT_AMBULATORY_CARE_PROVIDER_SITE_OTHER): Payer: 59 | Admitting: Family Medicine

## 2018-08-18 ENCOUNTER — Other Ambulatory Visit: Payer: Self-pay

## 2018-08-18 ENCOUNTER — Encounter (INDEPENDENT_AMBULATORY_CARE_PROVIDER_SITE_OTHER): Payer: Self-pay | Admitting: Family Medicine

## 2018-08-18 DIAGNOSIS — R7303 Prediabetes: Secondary | ICD-10-CM | POA: Diagnosis not present

## 2018-08-18 DIAGNOSIS — Z6841 Body Mass Index (BMI) 40.0 and over, adult: Secondary | ICD-10-CM

## 2018-08-18 DIAGNOSIS — E559 Vitamin D deficiency, unspecified: Secondary | ICD-10-CM

## 2018-08-18 MED ORDER — VITAMIN D (ERGOCALCIFEROL) 1.25 MG (50000 UNIT) PO CAPS: 50000.0000 [IU] | ORAL_CAPSULE | ORAL | 0 refills | Status: DC

## 2018-08-19 NOTE — Progress Notes (Signed)
Office: [phone]  /  Fax: [phone] TeleHealth Visit:  Barbara Trevino has verbally consented to this TeleHealth visit today. The patient is located at home, the provider is located at the [facility] office. The participants in this visit include the listed provider and patient. The visit was conducted today via face time.  HPI:   Chief Complaint: OBESITY Barbara Trevino is here to discuss her progress with her obesity treatment plan. She is on the Category 4 plan and is following her eating plan approximately 0 % of the time. She states she is doing yard work. Barbara Trevino's weight is of 288 lbs today. She notes work is picking up in terms of elective procedures starting. Last week she voices she did well, but she struggled at night. She also voices the past few days she has struggled to get on the plan.  We were unable to weigh the patient today for this TeleHealth visit. She feels as if she has maintained her weight since her last visit. She has lost 0 lbs since starting treatment with [place].  Pre-Diabetes Jakai has a diagnosis of pre-diabetes based on her elevated Hgb A1c of 6.3 and insulin of 32.2. She was informed this puts her at greater risk of developing diabetes. She notes carbohydrate cravings especially after dinner. She is not taking metformin currently and continues to work on diet and exercise to decrease risk of diabetes.   Vitamin D Deficiency Barbara Trevino has a diagnosis of vitamin D deficiency. She is currently taking prescription Vit D. She notes fatigue and denies nausea, vomiting or muscle weakness.  ASSESSMENT AND PLAN:  Vitamin D deficiency - Plan: Vitamin D, Ergocalciferol, (DRISDOL) 1.25 MG (50000 UT) CAPS capsule  Prediabetes  Class 3 severe obesity with serious comorbidity and body mass index (BMI) of 40.0 to 44.9 in adult, unspecified obesity type ([place])  PLAN:  Pre-Diabetes Barbara Trevino will continue to work on weight loss, exercise, and decreasing simple  carbohydrates in her diet to help decrease the risk of diabetes. We dicussed metformin including benefits and risks. She was informed that eating too many simple carbohydrates or too many calories at one sitting increases the likelihood of GI side effects. Taelyr declined metformin for now and a prescription was not written today. We will repeat labs at the end of June. Barbara Trevino agrees to follow up with our clinic in 2 weeks as directed to monitor her progress.  Vitamin D Deficiency Barbara Trevino was informed that low vitamin D levels contributes to fatigue and are associated with obesity, breast, and colon cancer. Barbara Trevino agrees to continue taking prescription Vit D @50 ,000 IU every week #4 and we will refill for 1 month. She will follow up for routine testing of vitamin D, at least 2-3 times per year. She was informed of the risk of over-replacement of vitamin D and agrees to not increase her dose unless she discusses this with [place] first. Liyat agrees to follow up with our clinic in 2 weeks.  Obesity Barbara Trevino is currently in the action stage of change. As such, her goal is to continue with weight loss efforts She has agreed to follow the Category 4 plan Barbara Trevino has been instructed to work up to a goal of 150 minutes of combined cardio and strengthening exercise per week for weight loss and overall health benefits. We discussed the following Behavioral Modification Strategies today: increasing lean protein intake, increasing vegetables and work on meal planning and easy cooking plans, keeping healthy foods in the home, and planning  for success   Barbara Trevino has agreed to follow up with our clinic in 2 weeks. She was informed of the importance of frequent follow up visits to maximize her success with intensive lifestyle modifications for her multiple health conditions.  ALLERGIES: Allergies  Allergen Reactions   Septra [Sulfamethoxazole-Trimethoprim] Nausea And Vomiting    Very sick per patient    MEDICATIONS: Current  Outpatient Medications on File Prior to Visit  Medication Sig Dispense Refill   amitriptyline (ELAVIL) 25 MG tablet Take 25 mg by mouth daily.  3   Calcium-Vitamin D (CALTRATE 600 PLUS-VIT D PO) Take 1,200 mg by mouth daily.     cholecalciferol (VITAMIN D) 1000 UNITS tablet Take 1,000 Units by mouth daily.     gabapentin (NEURONTIN) 300 MG capsule Take 2 capsules (600 mg total) by mouth 2 (two) times daily. 360 capsule 3   ibuprofen (ADVIL,MOTRIN) 200 MG tablet Take 200 mg by mouth every 6 (six) hours as needed.     loratadine (CLARITIN) 10 MG tablet Take 10 mg by mouth daily as needed for allergies.     meloxicam (MOBIC) 15 MG tablet Take 15 mg by mouth daily.     MYRBETRIQ 50 MG TB24 tablet Take 50 mg by mouth daily.  11   pantoprazole (PROTONIX) 40 MG tablet TAKE 1 TABLET BY MOUTH ONCE DAILY FOR ACID REFLUX  3   rosuvastatin (CRESTOR) 20 MG tablet Take 20 mg by mouth daily.     traZODone (DESYREL) 50 MG tablet TAKE 1, 2, OR 3 TABLETS BY MOUTH AT BEDTIME FOR SLEEP DISTURBANCE  3   venlafaxine XR (EFFEXOR-XR) 150 MG 24 hr capsule TAKE 1 CAPSULE BY MOUTH DAILY. 90 capsule 2   No current facility-administered medications on file prior to visit.     PAST MEDICAL HISTORY: Past Medical History:  Diagnosis Date   Achilles tendinosis    Cancer ([place]) 2007   hx rt breast cancer-bladder cancer   Fatty liver    GERD (gastroesophageal reflux disease)    Hyperlipidemia    Neuropathy    Osteoarthritis    Peripheral neuropathy 12/26/2016   Personal history of chemotherapy    Personal history of radiation therapy     PAST SURGICAL HISTORY: Past Surgical History:  Procedure Laterality Date   ABDOMINAL HYSTERECTOMY  2001   vag hyst   BLADDER SUSPENSION  2009   cysto bladder cancer removed   BREAST LUMPECTOMY Right 2007   BREAST REDUCTION SURGERY  2004   BREAST SURGERY  2007   rt lumpectomy-snbx   CALCANEAL OSTEOTOMY  03/06/2012   Procedure: CALCANEAL OSTEOTOMY;   Surgeon: Wylene Simmer, MD;  Location: [facility];  Service: Orthopedics;  Laterality: Right;  Right Gastroc Slide; Partial Tibia Tenolysis; Flexor Digitorum Longus Transfer to Navicular; Calcaneal Osteotomy   CARPAL TUNNEL RELEASE  2007   left   CYSTOSCOPY WITH BIOPSY     GASTROCNEMIUS RECESSION  03/06/2012   Procedure: GASTROCNEMIUS SLIDE;  Surgeon: Wylene Simmer, MD;  Location: [facility];  Service: Orthopedics;  Laterality: Right;  Right Gastroc Slide; Partial Tibia Tenolysis; Flexor Digitorum Longus Transfer to Navicular; Calcaneal Osteotomy     OOPHORECTOMY     PORT-A-CATH REMOVAL  2007   insertion then taken out   REDUCTION MAMMAPLASTY Bilateral    20 plus years    SOCIAL HISTORY: Social History   Tobacco Use   Smoking status: Never Smoker   Smokeless tobacco: Never Used  Substance Use Topics   Alcohol use:  No   Drug use: No    FAMILY HISTORY: Family History  Problem Relation Age of Onset   Cancer Mother 13       Breast cancer   Hypertension Mother    Arthritis Mother    Breast cancer Mother    Hyperlipidemia Mother    Hypertension Father    Arthritis Father    CAD Father    Stroke Father    Cancer Father    Kidney disease Father    Heart disease Father    Migraines Sister    Cancer Maternal Aunt        Breast Cancer   Breast cancer Maternal Aunt    Cancer Paternal Aunt 31       Breast Cancer    ROS: Review of Systems  Constitutional: Positive for malaise/fatigue. Negative for weight loss.  Gastrointestinal: Negative for nausea and vomiting.  Musculoskeletal:       Negative muscle weakness    PHYSICAL EXAM: Pt in no acute distress  RECENT LABS AND TESTS: BMET    Component Value Date/Time   NA 139 06/18/2018 1356   K 4.6 06/18/2018 1356   CL 97 06/18/2018 1356   CO2 26 06/18/2018 1356   GLUCOSE 81 06/18/2018 1356   GLUCOSE 98 01/04/2011 0919   BUN 12 06/18/2018 1356   CREATININE 1.07 (H)  06/18/2018 1356   CALCIUM 9.8 06/18/2018 1356   GFRNONAA 58 (L) 06/18/2018 1356   GFRAA 67 06/18/2018 1356   Lab Results  Component Value Date   HGBA1C 6.3 (H) 06/18/2018   Lab Results  Component Value Date   INSULIN 32.2 (H) 06/18/2018   CBC    Component Value Date/Time   WBC 4.9 06/18/2018 1356   WBC 3.9 01/04/2011 0919   WBC 4.2 06/15/2009 1140   RBC 4.36 06/18/2018 1356   RBC 4.03 01/04/2011 0919   RBC 3.88 06/15/2009 1140   HGB 12.8 06/18/2018 1356   HGB 12.3 01/04/2011 0919   HCT 38.6 06/18/2018 1356   HCT 35.5 01/04/2011 0919   PLT 206 01/04/2011 0919   MCV 89 06/18/2018 1356   MCV 88.0 01/04/2011 0919   MCH 29.4 06/18/2018 1356   MCH 30.5 01/04/2011 0919   MCHC 33.2 06/18/2018 1356   MCHC 34.6 01/04/2011 0919   MCHC 33.8 06/15/2009 1140   RDW 13.5 06/18/2018 1356   RDW 14.1 01/04/2011 0919   LYMPHSABS 2.0 06/18/2018 1356   LYMPHSABS 1.4 01/04/2011 0919   MONOABS 0.2 01/04/2011 0919   EOSABS 0.1 06/18/2018 1356   BASOSABS 0.0 06/18/2018 1356   BASOSABS 0.0 01/04/2011 0919   Iron/TIBC/Ferritin/ %Sat No results found for: IRON, TIBC, FERRITIN, IRONPCTSAT Lipid Panel     Component Value Date/Time   CHOL 176 06/18/2018 1356   TRIG 146 06/18/2018 1356   HDL 61 06/18/2018 1356   LDLCALC 86 06/18/2018 1356   Hepatic Function Panel     Component Value Date/Time   PROT 7.2 06/18/2018 1356   ALBUMIN 4.7 06/18/2018 1356   AST 34 06/18/2018 1356   ALT 40 (H) 06/18/2018 1356   ALKPHOS 94 06/18/2018 1356   BILITOT 0.3 06/18/2018 1356      Component Value Date/Time   TSH 0.759 06/18/2018 1356   TSH 3.216 03/29/2006 1058   TSH 1.101 02/12/2006 1025      I, Barbara Trevino, am acting as Location manager for Ilene Qua, MD   I have reviewed the above documentation for accuracy and completeness, and I agree with  the above. - Ilene Qua, MD

## 2018-08-20 ENCOUNTER — Telehealth: Payer: Self-pay

## 2018-08-20 NOTE — Telephone Encounter (Signed)
Follow up has been scheduled. Patient is aware of appt day and time.   

## 2018-09-02 ENCOUNTER — Ambulatory Visit (INDEPENDENT_AMBULATORY_CARE_PROVIDER_SITE_OTHER): Payer: Self-pay | Admitting: Family Medicine

## 2018-09-02 ENCOUNTER — Other Ambulatory Visit: Payer: Self-pay

## 2018-09-02 ENCOUNTER — Ambulatory Visit (INDEPENDENT_AMBULATORY_CARE_PROVIDER_SITE_OTHER): Payer: 59 | Admitting: Family Medicine

## 2018-09-02 ENCOUNTER — Encounter (INDEPENDENT_AMBULATORY_CARE_PROVIDER_SITE_OTHER): Payer: Self-pay | Admitting: Family Medicine

## 2018-09-02 DIAGNOSIS — E7849 Other hyperlipidemia: Secondary | ICD-10-CM | POA: Diagnosis not present

## 2018-09-02 DIAGNOSIS — E559 Vitamin D deficiency, unspecified: Secondary | ICD-10-CM

## 2018-09-02 DIAGNOSIS — Z6841 Body Mass Index (BMI) 40.0 and over, adult: Secondary | ICD-10-CM | POA: Diagnosis not present

## 2018-09-03 NOTE — Progress Notes (Signed)
Office: [phone]  /  Fax: [phone] TeleHealth Visit:  Barbara Trevino has verbally consented to this TeleHealth visit today. The patient is located at home, the provider is located at the [facility] office. The participants in this visit include the listed provider and patient. The visit was conducted today via face time.  HPI:   Chief Complaint: OBESITY Barbara Trevino is here to discuss her progress with her obesity treatment plan. She is on the Category 4 plan and is following her eating plan approximately 60 % of the time. She states she is exercising 0 minutes 0 times per week. Barbara Trevino has done a bit more meal planning and prep, making it easier to follow the plan at work. She doesn't foresee any obstacle in the next few weeks. She did have indulgent eating the other day when her kids came to visit.  We were unable to weigh the patient today for this TeleHealth visit. She feels as if she has maintained her weight since her last visit. She has lost 0 lbs since starting treatment with [place].  Vitamin D Deficiency Barbara Trevino has a diagnosis of vitamin D deficiency. She is currently taking prescription Vit D. She notes fatigue and denies nausea, vomiting or muscle weakness.  Hyperlipidemia Barbara Trevino has hyperlipidemia and has been trying to improve her cholesterol levels with intensive lifestyle modification including a low saturated fat diet, exercise and weight loss. Last LDL was of 86 on 06/18/2018. She is on statin and denies any chest pain, claudication or myalgias.  ASSESSMENT AND PLAN:  Vitamin D deficiency - Plan: Vitamin D, Ergocalciferol, (DRISDOL) 1.25 MG (50000 UT) CAPS capsule  Other hyperlipidemia  Class 3 severe obesity with serious comorbidity and body mass index (BMI) of 40.0 to 44.9 in adult, unspecified obesity type (Barbara Trevino)  PLAN:  Vitamin D Deficiency Barbara Trevino was informed that low vitamin D levels contributes to fatigue and are associated with obesity, breast, and colon  cancer. Barbara Trevino agrees to continue taking prescription Vit D @50 ,000 IU every week #4 and we will refill for 1 month. She will follow up for routine testing of vitamin D, at least 2-3 times per year. She was informed of the risk of over-replacement of vitamin D and agrees to not increase her dose unless she discusses this with [place] first. Barbara Trevino agrees to follow up with our clinic in 2 weeks.  Hyperlipidemia Barbara Trevino was informed of the American Heart Association Guidelines emphasizing intensive lifestyle modifications as the first line treatment for hyperlipidemia. We discussed many lifestyle modifications today in depth, and Barbara Trevino will continue to work on decreasing saturated fats such as fatty red meat, butter and many fried foods. She will also increase vegetables and lean protein in her diet and continue to work on exercise and weight loss efforts. Barbara Trevino agrees to continue taking statin and she agrees to follow up with our clinic in 2 weeks.  Obesity Barbara Trevino is currently in the action stage of change. As such, her goal is to continue with weight loss efforts She has agreed to follow the Category 4 plan Barbara Trevino has been instructed to work up to a goal of 150 minutes of combined cardio and strengthening exercise per week for weight loss and overall health benefits. We discussed the following Behavioral Modification Strategies today: increasing lean protein intake, increasing vegetables and work on meal planning and easy cooking plans, keeping healthy foods in the home, and planning for success   Barbara Trevino has agreed to follow up with our clinic in  2 weeks. She was informed of the importance of frequent follow up visits to maximize her success with intensive lifestyle modifications for her multiple health conditions.  ALLERGIES: Allergies  Allergen Reactions   Septra [Sulfamethoxazole-Trimethoprim] Nausea And Vomiting    Very sick per patient    MEDICATIONS: Current Outpatient Medications on File Prior to  Visit  Medication Sig Dispense Refill   amitriptyline (ELAVIL) 25 MG tablet Take 25 mg by mouth daily.  3   gabapentin (NEURONTIN) 300 MG capsule Take 2 capsules (600 mg total) by mouth 2 (two) times daily. 360 capsule 3   ibuprofen (ADVIL,MOTRIN) 200 MG tablet Take 200 mg by mouth every 6 (six) hours as needed.     loratadine (CLARITIN) 10 MG tablet Take 10 mg by mouth daily as needed for allergies.     meloxicam (MOBIC) 15 MG tablet Take 15 mg by mouth daily.     MYRBETRIQ 50 MG TB24 tablet Take 50 mg by mouth daily.  11   pantoprazole (PROTONIX) 40 MG tablet TAKE 1 TABLET BY MOUTH ONCE DAILY FOR ACID REFLUX  3   rosuvastatin (CRESTOR) 20 MG tablet Take 20 mg by mouth daily.     traZODone (DESYREL) 50 MG tablet TAKE 1, 2, OR 3 TABLETS BY MOUTH AT BEDTIME FOR SLEEP DISTURBANCE  3   venlafaxine XR (EFFEXOR-XR) 150 MG 24 hr capsule TAKE 1 CAPSULE BY MOUTH DAILY. 90 capsule 2   Vitamin D, Ergocalciferol, (DRISDOL) 1.25 MG (50000 UT) CAPS capsule Take 1 capsule (50,000 Units total) by mouth every 7 (seven) days. 4 capsule 0   No current facility-administered medications on file prior to visit.     PAST MEDICAL HISTORY: Past Medical History:  Diagnosis Date   Achilles tendinosis    Cancer ([place]) 2007   hx rt breast cancer-bladder cancer   Fatty liver    GERD (gastroesophageal reflux disease)    Hyperlipidemia    Neuropathy    Osteoarthritis    Peripheral neuropathy 12/26/2016   Personal history of chemotherapy    Personal history of radiation therapy     PAST SURGICAL HISTORY: Past Surgical History:  Procedure Laterality Date   ABDOMINAL HYSTERECTOMY  2001   vag hyst   BLADDER SUSPENSION  2009   cysto bladder cancer removed   BREAST LUMPECTOMY Right 2007   BREAST REDUCTION SURGERY  2004   BREAST SURGERY  2007   rt lumpectomy-snbx   CALCANEAL OSTEOTOMY  03/06/2012   Procedure: CALCANEAL OSTEOTOMY;  Surgeon: Wylene Simmer, MD;  Location: [facility];  Service: Orthopedics;  Laterality: Right;  Right Gastroc Slide; Partial Tibia Tenolysis; Flexor Digitorum Longus Transfer to Navicular; Calcaneal Osteotomy   CARPAL TUNNEL RELEASE  2007   left   CYSTOSCOPY WITH BIOPSY     GASTROCNEMIUS RECESSION  03/06/2012   Procedure: GASTROCNEMIUS SLIDE;  Surgeon: Wylene Simmer, MD;  Location: [facility];  Service: Orthopedics;  Laterality: Right;  Right Gastroc Slide; Partial Tibia Tenolysis; Flexor Digitorum Longus Transfer to Navicular; Calcaneal Osteotomy     OOPHORECTOMY     PORT-A-CATH REMOVAL  2007   insertion then taken out   REDUCTION MAMMAPLASTY Bilateral    20 plus years    SOCIAL HISTORY: Social History   Tobacco Use   Smoking status: Never Smoker   Smokeless tobacco: Never Used  Substance Use Topics   Alcohol use: No   Drug use: No    FAMILY HISTORY: Family History  Problem Relation Age of Onset  Cancer Mother 82       Breast cancer   Hypertension Mother    Arthritis Mother    Breast cancer Mother    Hyperlipidemia Mother    Hypertension Father    Arthritis Father    CAD Father    Stroke Father    Cancer Father    Kidney disease Father    Heart disease Father    Migraines Sister    Cancer Maternal Aunt        Breast Cancer   Breast cancer Maternal Aunt    Cancer Paternal Aunt 77       Breast Cancer    ROS: Review of Systems  Constitutional: Positive for malaise/fatigue. Negative for weight loss.  Cardiovascular: Negative for chest pain and claudication.  Gastrointestinal: Negative for nausea and vomiting.  Musculoskeletal: Negative for myalgias.       Negative muscle weakness    PHYSICAL EXAM: Pt in no acute distress  RECENT LABS AND TESTS: BMET    Component Value Date/Time   NA 139 06/18/2018 1356   K 4.6 06/18/2018 1356   CL 97 06/18/2018 1356   CO2 26 06/18/2018 1356   GLUCOSE 81 06/18/2018 1356   GLUCOSE 98 01/04/2011 0919   BUN 12 06/18/2018  1356   CREATININE 1.07 (H) 06/18/2018 1356   CALCIUM 9.8 06/18/2018 1356   GFRNONAA 58 (L) 06/18/2018 1356   GFRAA 67 06/18/2018 1356   Lab Results  Component Value Date   HGBA1C 6.3 (H) 06/18/2018   Lab Results  Component Value Date   INSULIN 32.2 (H) 06/18/2018   CBC    Component Value Date/Time   WBC 4.9 06/18/2018 1356   WBC 3.9 01/04/2011 0919   WBC 4.2 06/15/2009 1140   RBC 4.36 06/18/2018 1356   RBC 4.03 01/04/2011 0919   RBC 3.88 06/15/2009 1140   HGB 12.8 06/18/2018 1356   HGB 12.3 01/04/2011 0919   HCT 38.6 06/18/2018 1356   HCT 35.5 01/04/2011 0919   PLT 206 01/04/2011 0919   MCV 89 06/18/2018 1356   MCV 88.0 01/04/2011 0919   MCH 29.4 06/18/2018 1356   MCH 30.5 01/04/2011 0919   MCHC 33.2 06/18/2018 1356   MCHC 34.6 01/04/2011 0919   MCHC 33.8 06/15/2009 1140   RDW 13.5 06/18/2018 1356   RDW 14.1 01/04/2011 0919   LYMPHSABS 2.0 06/18/2018 1356   LYMPHSABS 1.4 01/04/2011 0919   MONOABS 0.2 01/04/2011 0919   EOSABS 0.1 06/18/2018 1356   BASOSABS 0.0 06/18/2018 1356   BASOSABS 0.0 01/04/2011 0919   Iron/TIBC/Ferritin/ %Sat No results found for: IRON, TIBC, FERRITIN, IRONPCTSAT Lipid Panel     Component Value Date/Time   CHOL 176 06/18/2018 1356   TRIG 146 06/18/2018 1356   HDL 61 06/18/2018 1356   LDLCALC 86 06/18/2018 1356   Hepatic Function Panel     Component Value Date/Time   PROT 7.2 06/18/2018 1356   ALBUMIN 4.7 06/18/2018 1356   AST 34 06/18/2018 1356   ALT 40 (H) 06/18/2018 1356   ALKPHOS 94 06/18/2018 1356   BILITOT 0.3 06/18/2018 1356      Component Value Date/Time   TSH 0.759 06/18/2018 1356   TSH 3.216 03/29/2006 1058   TSH 1.101 02/12/2006 1025      I, Trixie Dredge, am acting as Location manager for Ilene Qua, MD  I have reviewed the above documentation for accuracy and completeness, and I agree with the above. - Ilene Qua, MD

## 2018-09-08 MED FILL — AMITRIPTYLINE HCL 25 MG TAB: 25 | 90 days supply | Qty: 180 | Fill #3

## 2018-09-08 MED FILL — VIT D2 1.25 MG (50,000 UNIT: 1.25 MG | 28 days supply | Qty: 4 | Fill #0

## 2018-09-08 MED FILL — GABAPENTIN 300 MG CAPSULE: 300 | 90 days supply | Qty: 360 | Fill #1

## 2018-09-08 MED FILL — VENLAFAXINE HCL ER 150 MG C: 150 | 90 days supply | Qty: 90 | Fill #3

## 2018-09-09 MED ORDER — VITAMIN D (ERGOCALCIFEROL) 1.25 MG (50000 UNIT) PO CAPS: 50000.0000 [IU] | ORAL_CAPSULE | ORAL | 0 refills | Status: DC

## 2018-09-16 ENCOUNTER — Encounter (INDEPENDENT_AMBULATORY_CARE_PROVIDER_SITE_OTHER): Payer: Self-pay | Admitting: Family Medicine

## 2018-09-16 ENCOUNTER — Other Ambulatory Visit: Payer: Self-pay

## 2018-09-16 ENCOUNTER — Ambulatory Visit (INDEPENDENT_AMBULATORY_CARE_PROVIDER_SITE_OTHER): Payer: 59 | Admitting: Family Medicine

## 2018-09-16 DIAGNOSIS — E559 Vitamin D deficiency, unspecified: Secondary | ICD-10-CM | POA: Diagnosis not present

## 2018-09-16 DIAGNOSIS — Z6841 Body Mass Index (BMI) 40.0 and over, adult: Secondary | ICD-10-CM | POA: Diagnosis not present

## 2018-09-16 DIAGNOSIS — E66813 Obesity, class 3: Secondary | ICD-10-CM

## 2018-09-16 DIAGNOSIS — Z711 Person with feared health complaint in whom no diagnosis is made: Secondary | ICD-10-CM | POA: Diagnosis not present

## 2018-09-16 DIAGNOSIS — R7303 Prediabetes: Secondary | ICD-10-CM

## 2018-09-17 NOTE — Progress Notes (Signed)
Office: [phone]  /  Fax: [phone] TeleHealth Visit:  Barbara Trevino has verbally consented to this TeleHealth visit today. The patient is located at home, the provider is located at the [facility] office. The participants in this visit include the listed provider and patient. Barbara Trevino was unable to use realtime audiovisual technology today and the telehealth visit was conducted via audio only (20 minutes).   HPI:   Chief Complaint: OBESITY Barbara Trevino is here to discuss her progress with her obesity treatment plan. She is on the Category 4 plan and is following her eating plan approximately 50 % of the time. She states she is exercising 0 minutes 0 times per week. Barbara Trevino is voicing that she is unsure if she should continue the program. She voices that work has increased in how busy she is. she is tired and often not planning for dinner.  We were unable to weigh the patient today for this TeleHealth visit. She feels as if she has maintained her weight since her last visit. She has lost 0 lbs since starting treatment with [place].  Vitamin D Deficiency Barbara Trevino has a diagnosis of vitamin D deficiency. She is currently taking prescription Vit D. She notes fatigue and denies nausea, vomiting or muscle weakness.  Pre-Diabetes Barbara Trevino has a diagnosis of pre-diabetes based on her elevated Hgb A1c and was informed this puts her at greater risk of developing diabetes. She notes carbohydrate cravings and she is not taking metformin currently. She continues to work on diet and exercise to decrease risk of diabetes.  Worried Well Barbara Trevino voices consistent concern over COVID-19, as well as exposure and the impact on work.  ASSESSMENT AND PLAN:  Vitamin D deficiency  Prediabetes  Worried well  Class 3 severe obesity with serious comorbidity and body mass index (BMI) of 40.0 to 44.9 in adult, unspecified obesity type ([place])  PLAN:  Vitamin D Deficiency Barbara Trevino was informed that low vitamin D  levels contributes to fatigue and are associated with obesity, breast, and colon cancer. Barbara Trevino agrees to continue taking prescription Vit D @50 ,000 IU every week, no refill needed. She will follow up for routine testing of vitamin D, at least 2-3 times per year. She was informed of the risk of over-replacement of vitamin D and agrees to not increase her dose unless she discusses this with [place] first. Barbara Trevino agrees to follow up with our clinic in 2 weeks.  Pre-Diabetes Barbara Trevino will continue to work on weight loss, exercise, and decreasing simple carbohydrates in her diet to help decrease the risk of diabetes. We dicussed metformin including benefits and risks. She was informed that eating too many simple carbohydrates or too many calories at one sitting increases the likelihood of GI side effects. Barbara Trevino declined metformin for now and a prescription was not written today. We will repeat labs in early July. Barbara Trevino agrees to follow up with our clinic in 2 weeks as directed to monitor her progress.  Worried Well Pt was educated on the need for social distancing, no travel and hand washing/hand sanitizing and mask usage. We discussed symptoms of COVID19 and we discussed the possible need for sequestration and how to deal with this if it happens. Pt offered guidance and reassurance.  I spent > than 50% of the 15 minute visit on counseling as documented in the note.  Obesity Barbara Trevino is currently in the action stage of change. As such, her goal is to continue with weight loss efforts She has agreed to follow  the Category 4 plan Barbara Trevino has been instructed to work up to a goal of 150 minutes of combined cardio and strengthening exercise per week for weight loss and overall health benefits. We discussed the following Behavioral Modification Strategies today: increasing lean protein intake, increasing lower sugar fruits and work on meal planning and easy cooking plans, keeping healthy foods in the home, better snacking  choices, and planning for success   Barbara Trevino has agreed to follow up with our clinic in 2 weeks. She was informed of the importance of frequent follow up visits to maximize her success with intensive lifestyle modifications for her multiple health conditions.  ALLERGIES: Allergies  Allergen Reactions  . Septra [Sulfamethoxazole-Trimethoprim] Nausea And Vomiting    Very sick per patient    MEDICATIONS: Current Outpatient Medications on File Prior to Visit  Medication Sig Dispense Refill  . amitriptyline (ELAVIL) 25 MG tablet Take 25 mg by mouth daily.  3  . gabapentin (NEURONTIN) 300 MG capsule Take 2 capsules (600 mg total) by mouth 2 (two) times daily. 360 capsule 3  . ibuprofen (ADVIL,MOTRIN) 200 MG tablet Take 200 mg by mouth every 6 (six) hours as needed.    . loratadine (CLARITIN) 10 MG tablet Take 10 mg by mouth daily as needed for allergies.    . meloxicam (MOBIC) 15 MG tablet Take 15 mg by mouth daily.    Marland Kitchen MYRBETRIQ 50 MG TB24 tablet Take 50 mg by mouth daily.  11  . pantoprazole (PROTONIX) 40 MG tablet TAKE 1 TABLET BY MOUTH ONCE DAILY FOR ACID REFLUX  3  . rosuvastatin (CRESTOR) 20 MG tablet Take 20 mg by mouth daily.    . traZODone (DESYREL) 50 MG tablet TAKE 1, 2, OR 3 TABLETS BY MOUTH AT BEDTIME FOR SLEEP DISTURBANCE  3  . venlafaxine XR (EFFEXOR-XR) 150 MG 24 hr capsule TAKE 1 CAPSULE BY MOUTH DAILY. 90 capsule 2  . Vitamin D, Ergocalciferol, (DRISDOL) 1.25 MG (50000 UT) CAPS capsule Take 1 capsule (50,000 Units total) by mouth every 7 (seven) days. 4 capsule 0   No current facility-administered medications on file prior to visit.     PAST MEDICAL HISTORY: Past Medical History:  Diagnosis Date  . Achilles tendinosis   . Cancer [facility]) 2007   hx rt breast cancer-bladder cancer  . Fatty liver   . GERD (gastroesophageal reflux disease)   . Hyperlipidemia   . Neuropathy   . Osteoarthritis   . Peripheral neuropathy 12/26/2016  . Personal history of chemotherapy   .  Personal history of radiation therapy     PAST SURGICAL HISTORY: Past Surgical History:  Procedure Laterality Date  . ABDOMINAL HYSTERECTOMY  2001   vag hyst  . BLADDER SUSPENSION  2009   cysto bladder cancer removed  . BREAST LUMPECTOMY Right 2007  . BREAST REDUCTION SURGERY  2004  . BREAST SURGERY  2007   rt lumpectomy-snbx  . CALCANEAL OSTEOTOMY  03/06/2012   Procedure: CALCANEAL OSTEOTOMY;  Surgeon: Wylene Simmer, MD;  Location: [place];  Service: Orthopedics;  Laterality: Right;  Right Gastroc Slide; Partial Tibia Tenolysis; Flexor Digitorum Longus Transfer to Navicular; Calcaneal Osteotomy  . CARPAL TUNNEL RELEASE  2007   left  . CYSTOSCOPY WITH BIOPSY    . GASTROCNEMIUS RECESSION  03/06/2012   Procedure: GASTROCNEMIUS SLIDE;  Surgeon: Wylene Simmer, MD;  Location: [place];  Service: Orthopedics;  Laterality: Right;  Right Gastroc Slide; Partial Tibia Tenolysis; Flexor Digitorum Longus Transfer to Navicular; Calcaneal  Osteotomy    . OOPHORECTOMY    . PORT-A-CATH REMOVAL  2007   insertion then taken out  . REDUCTION MAMMAPLASTY Bilateral    20 plus years    SOCIAL HISTORY: Social History   Tobacco Use  . Smoking status: Never Smoker  . Smokeless tobacco: Never Used  Substance Use Topics  . Alcohol use: No  . Drug use: No    FAMILY HISTORY: Family History  Problem Relation Age of Onset  . Cancer Mother 87       Breast cancer  . Hypertension Mother   . Arthritis Mother   . Breast cancer Mother   . Hyperlipidemia Mother   . Hypertension Father   . Arthritis Father   . CAD Father   . Stroke Father   . Cancer Father   . Kidney disease Father   . Heart disease Father   . Migraines Sister   . Cancer Maternal Aunt        Breast Cancer  . Breast cancer Maternal Aunt   . Cancer Paternal Aunt 51       Breast Cancer    ROS: Review of Systems  Constitutional: Positive for malaise/fatigue. Negative for weight loss.   Gastrointestinal: Negative for nausea and vomiting.  Musculoskeletal:       Negative muscle weakness    PHYSICAL EXAM: Pt in no acute distress  RECENT LABS AND TESTS: BMET    Component Value Date/Time   NA 139 06/18/2018 1356   K 4.6 06/18/2018 1356   CL 97 06/18/2018 1356   CO2 26 06/18/2018 1356   GLUCOSE 81 06/18/2018 1356   GLUCOSE 98 01/04/2011 0919   BUN 12 06/18/2018 1356   CREATININE 1.07 (H) 06/18/2018 1356   CALCIUM 9.8 06/18/2018 1356   GFRNONAA 58 (L) 06/18/2018 1356   GFRAA 67 06/18/2018 1356   Lab Results  Component Value Date   HGBA1C 6.3 (H) 06/18/2018   Lab Results  Component Value Date   INSULIN 32.2 (H) 06/18/2018   CBC    Component Value Date/Time   WBC 4.9 06/18/2018 1356   WBC 3.9 01/04/2011 0919   WBC 4.2 06/15/2009 1140   RBC 4.36 06/18/2018 1356   RBC 4.03 01/04/2011 0919   RBC 3.88 06/15/2009 1140   HGB 12.8 06/18/2018 1356   HGB 12.3 01/04/2011 0919   HCT 38.6 06/18/2018 1356   HCT 35.5 01/04/2011 0919   PLT 206 01/04/2011 0919   MCV 89 06/18/2018 1356   MCV 88.0 01/04/2011 0919   MCH 29.4 06/18/2018 1356   MCH 30.5 01/04/2011 0919   MCHC 33.2 06/18/2018 1356   MCHC 34.6 01/04/2011 0919   MCHC 33.8 06/15/2009 1140   RDW 13.5 06/18/2018 1356   RDW 14.1 01/04/2011 0919   LYMPHSABS 2.0 06/18/2018 1356   LYMPHSABS 1.4 01/04/2011 0919   MONOABS 0.2 01/04/2011 0919   EOSABS 0.1 06/18/2018 1356   BASOSABS 0.0 06/18/2018 1356   BASOSABS 0.0 01/04/2011 0919   Iron/TIBC/Ferritin/ %Sat No results found for: IRON, TIBC, FERRITIN, IRONPCTSAT Lipid Panel     Component Value Date/Time   CHOL 176 06/18/2018 1356   TRIG 146 06/18/2018 1356   HDL 61 06/18/2018 1356   LDLCALC 86 06/18/2018 1356   Hepatic Function Panel     Component Value Date/Time   PROT 7.2 06/18/2018 1356   ALBUMIN 4.7 06/18/2018 1356   AST 34 06/18/2018 1356   ALT 40 (H) 06/18/2018 1356   ALKPHOS 94 06/18/2018 1356  BILITOT 0.3 06/18/2018 1356       Component Value Date/Time   TSH 0.759 06/18/2018 1356   TSH 3.216 03/29/2006 1058   TSH 1.101 02/12/2006 1025      I, Trixie Dredge, am acting as Location manager for Ilene Qua, MD  I have reviewed the above documentation for accuracy and completeness, and I agree with the above. - Ilene Qua, MD

## 2018-09-30 ENCOUNTER — Ambulatory Visit (INDEPENDENT_AMBULATORY_CARE_PROVIDER_SITE_OTHER): Payer: 59 | Admitting: Family Medicine

## 2018-10-22 ENCOUNTER — Other Ambulatory Visit: Payer: Self-pay | Admitting: Family Medicine

## 2018-10-22 DIAGNOSIS — R609 Edema, unspecified: Secondary | ICD-10-CM | POA: Diagnosis not present

## 2018-10-22 DIAGNOSIS — G620 Drug-induced polyneuropathy: Secondary | ICD-10-CM | POA: Diagnosis not present

## 2018-10-22 DIAGNOSIS — Z0000 Encounter for general adult medical examination without abnormal findings: Secondary | ICD-10-CM | POA: Diagnosis not present

## 2018-10-22 DIAGNOSIS — Z853 Personal history of malignant neoplasm of breast: Secondary | ICD-10-CM

## 2018-10-22 DIAGNOSIS — N183 Chronic kidney disease, stage 3 (moderate): Secondary | ICD-10-CM | POA: Diagnosis not present

## 2018-10-22 DIAGNOSIS — K219 Gastro-esophageal reflux disease without esophagitis: Secondary | ICD-10-CM | POA: Diagnosis not present

## 2018-10-22 DIAGNOSIS — E782 Mixed hyperlipidemia: Secondary | ICD-10-CM | POA: Diagnosis not present

## 2018-10-22 DIAGNOSIS — R7303 Prediabetes: Secondary | ICD-10-CM | POA: Diagnosis not present

## 2018-10-22 DIAGNOSIS — M1990 Unspecified osteoarthritis, unspecified site: Secondary | ICD-10-CM | POA: Diagnosis not present

## 2018-10-22 MED FILL — MYRBETRIQ ER 50 MG TABLET: 50 | 30 days supply | Qty: 30 | Fill #0

## 2018-10-22 MED FILL — ROSUVASTATIN CALCIUM 20 MG: 20 | 90 days supply | Qty: 90 | Fill #0

## 2018-10-22 MED FILL — PANTOPRAZOLE SOD DR 40 MG T: 40 | 90 days supply | Qty: 90 | Fill #0

## 2018-10-22 MED FILL — VIT D2 1.25 MG (50,000 UNIT: 1.25 MG | 28 days supply | Qty: 4 | Fill #0

## 2018-10-22 MED FILL — MELOXICAM 15 MG TABLET: 15 | 90 days supply | Qty: 90 | Fill #0

## 2018-10-22 MED FILL — valACYclovir HCL 1 GM TABS: 1 | 6 days supply | Qty: 24 | Fill #0

## 2018-10-23 MED FILL — CYCLOBENZAPRINE 10 MG TAB: 10 | 30 days supply | Qty: 90 | Fill #0

## 2018-10-27 DIAGNOSIS — R7303 Prediabetes: Secondary | ICD-10-CM | POA: Diagnosis not present

## 2018-10-27 DIAGNOSIS — E782 Mixed hyperlipidemia: Secondary | ICD-10-CM | POA: Diagnosis not present

## 2018-10-27 DIAGNOSIS — N183 Chronic kidney disease, stage 3 (moderate): Secondary | ICD-10-CM | POA: Diagnosis not present

## 2018-10-27 DIAGNOSIS — E559 Vitamin D deficiency, unspecified: Secondary | ICD-10-CM | POA: Diagnosis not present

## 2018-10-28 ENCOUNTER — Other Ambulatory Visit: Payer: Self-pay | Admitting: Family Medicine

## 2018-10-28 ENCOUNTER — Other Ambulatory Visit: Payer: Self-pay | Admitting: General Surgery

## 2018-10-28 DIAGNOSIS — N6489 Other specified disorders of breast: Secondary | ICD-10-CM

## 2018-10-30 ENCOUNTER — Other Ambulatory Visit: Payer: Self-pay

## 2018-10-30 ENCOUNTER — Ambulatory Visit
Admission: RE | Admit: 2018-10-30 | Discharge: 2018-10-30 | Disposition: A | Discharge status: Home / Self Care | Payer: 59 | Source: Ambulatory Visit | Attending: Family Medicine | Admitting: Family Medicine

## 2018-10-30 DIAGNOSIS — N6489 Other specified disorders of breast: Secondary | ICD-10-CM

## 2018-10-30 DIAGNOSIS — R928 Other abnormal and inconclusive findings on diagnostic imaging of breast: Secondary | ICD-10-CM | POA: Diagnosis not present

## 2018-11-21 DIAGNOSIS — Z8551 Personal history of malignant neoplasm of bladder: Secondary | ICD-10-CM | POA: Diagnosis not present

## 2018-11-21 DIAGNOSIS — N3946 Mixed incontinence: Secondary | ICD-10-CM | POA: Diagnosis not present

## 2018-11-21 MED FILL — MYRBETRIQ ER 50 MG TABLET: 50 | 90 days supply | Qty: 90 | Fill #0

## 2018-12-22 MED FILL — AMITRIPTYLINE HCL 25 MG TAB: 25 | 90 days supply | Qty: 180 | Fill #0

## 2018-12-22 MED FILL — VENLAFAXINE HCL ER 150 MG C: 150 | 90 days supply | Qty: 90 | Fill #0

## 2019-02-10 ENCOUNTER — Ambulatory Visit: Payer: Self-pay | Admitting: Adult Health

## 2019-02-19 MED FILL — MELOXICAM 15 MG TABLET: 15 | 90 days supply | Qty: 90 | Fill #1

## 2019-02-19 MED FILL — ROSUVASTATIN CALCIUM 20 MG: 20 | 90 days supply | Qty: 90 | Fill #1

## 2019-02-19 MED FILL — PANTOPRAZOLE SOD DR 40 MG T: 40 | 90 days supply | Qty: 90 | Fill #1

## 2019-02-19 MED FILL — MYRBETRIQ ER 50 MG TABLET: 50 | 90 days supply | Qty: 90 | Fill #1

## 2019-03-03 ENCOUNTER — Telehealth: Payer: Self-pay | Admitting: Adult Health

## 2019-03-10 DIAGNOSIS — R3915 Urgency of urination: Secondary | ICD-10-CM | POA: Diagnosis not present

## 2019-03-12 MED FILL — NITROFURANTOIN MONO-MCR 100: 100 | 7 days supply | Qty: 14 | Fill #0

## 2019-03-18 MED FILL — GABAPENTIN 300 MG CAPSULE: 300 | 90 days supply | Qty: 360 | Fill #2

## 2019-03-18 MED FILL — VENLAFAXINE HCL ER 150 MG C: 150 | 90 days supply | Qty: 90 | Fill #1

## 2019-03-18 MED FILL — AMITRIPTYLINE HCL 25 MG TAB: 25 | 90 days supply | Qty: 180 | Fill #1

## 2019-03-24 DIAGNOSIS — R8271 Bacteriuria: Secondary | ICD-10-CM | POA: Diagnosis not present

## 2019-03-25 MED FILL — NITROFURANTOIN MCR 100 MG C: 100 | 5 days supply | Qty: 10 | Fill #0

## 2019-04-13 DIAGNOSIS — N3946 Mixed incontinence: Secondary | ICD-10-CM | POA: Diagnosis not present

## 2019-04-13 DIAGNOSIS — B9620 Unspecified Escherichia coli [E. coli] as the cause of diseases classified elsewhere: Secondary | ICD-10-CM | POA: Diagnosis not present

## 2019-04-13 DIAGNOSIS — Z8551 Personal history of malignant neoplasm of bladder: Secondary | ICD-10-CM | POA: Diagnosis not present

## 2019-04-13 DIAGNOSIS — N390 Urinary tract infection, site not specified: Secondary | ICD-10-CM | POA: Diagnosis not present

## 2019-04-13 MED FILL — CEPHALEXIN 500 MG CAPSULE: 500 | 7 days supply | Qty: 21 | Fill #0

## 2019-05-07 DIAGNOSIS — N390 Urinary tract infection, site not specified: Secondary | ICD-10-CM | POA: Diagnosis not present

## 2019-05-07 DIAGNOSIS — R300 Dysuria: Secondary | ICD-10-CM | POA: Diagnosis not present

## 2019-05-07 DIAGNOSIS — B9620 Unspecified Escherichia coli [E. coli] as the cause of diseases classified elsewhere: Secondary | ICD-10-CM | POA: Diagnosis not present

## 2019-05-07 DIAGNOSIS — R8279 Other abnormal findings on microbiological examination of urine: Secondary | ICD-10-CM | POA: Diagnosis not present

## 2019-05-07 MED FILL — DOXYCYCLINE HYCLATE 100 MG: 100 | 7 days supply | Qty: 14 | Fill #0

## 2019-05-13 MED FILL — NITROFURANTOIN MCR 50 MG CA: 50 | 30 days supply | Qty: 30 | Fill #0

## 2019-05-25 MED FILL — MELOXICAM 15 MG TABLET: 15 | 90 days supply | Qty: 90 | Fill #2

## 2019-05-25 MED FILL — MYRBETRIQ ER 50 MG TABLET: 50 | 90 days supply | Qty: 90 | Fill #2

## 2019-05-25 MED FILL — ROSUVASTATIN CALCIUM 20 MG: 20 | 90 days supply | Qty: 90 | Fill #2

## 2019-05-25 MED FILL — PANTOPRAZOLE SOD DR 40 MG T: 40 | 90 days supply | Qty: 90 | Fill #2

## 2019-06-17 MED FILL — NITROFURANTOIN MCR 50 MG CA: 50 | 30 days supply | Qty: 30 | Fill #1

## 2019-06-17 MED FILL — AMITRIPTYLINE HCL 25 MG TAB: 25 | 90 days supply | Qty: 180 | Fill #2

## 2019-06-17 MED FILL — VENLAFAXINE HCL ER 150 MG C: 150 | 90 days supply | Qty: 90 | Fill #2

## 2019-07-29 MED FILL — NITROFURANTOIN MCR 50 MG CA: 50 | 30 days supply | Qty: 30 | Fill #2

## 2019-08-01 IMAGING — US US ABDOMEN LIMITED
1 series · 14 of 25 positions shown · non-contrast
Comparison: None.

CLINICAL DATA: Elevated liver enzymes

EXAM:
ULTRASOUND ABDOMEN LIMITED RIGHT UPPER QUADRANT

[Series 1: us abdomen limited · 0.22mm/px · 14 of 40 slices shown]
[im 1/40]
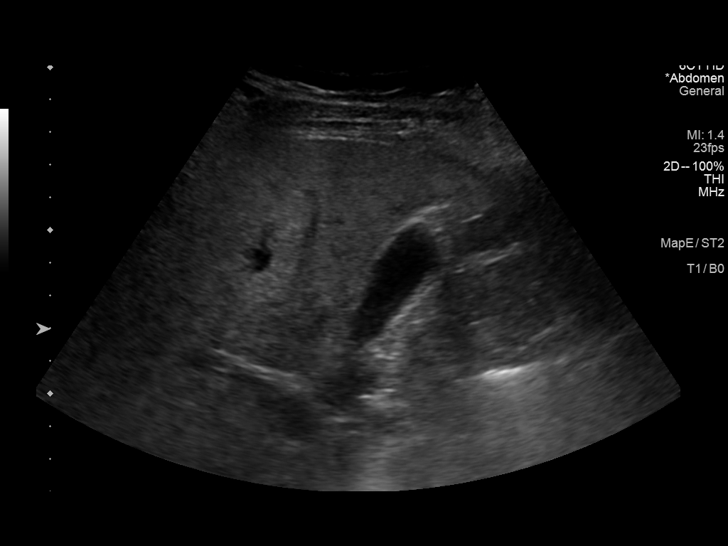
[im 4/40]
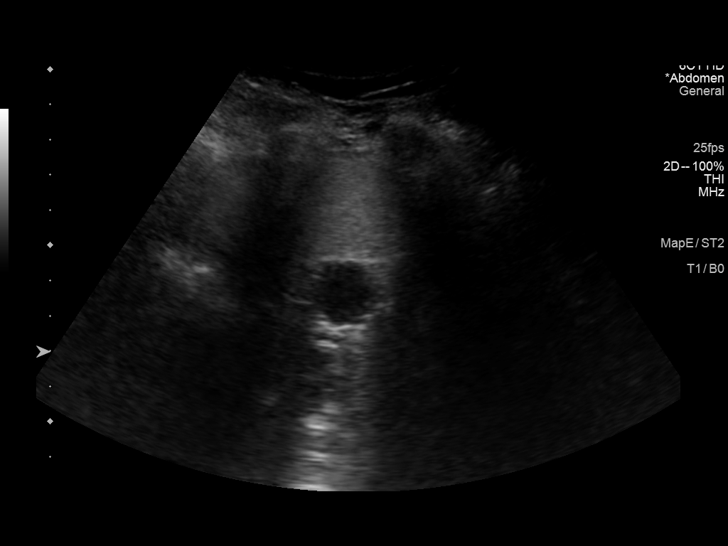
[im 7/40]
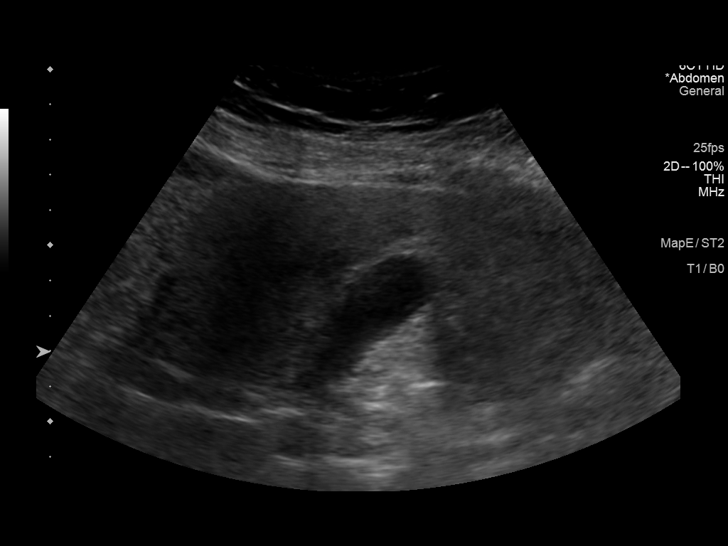
[im 10/40]
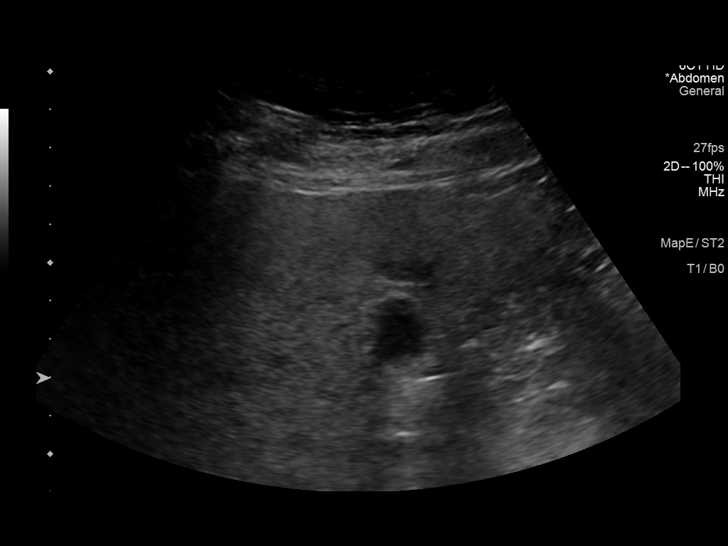
[im 14/40]
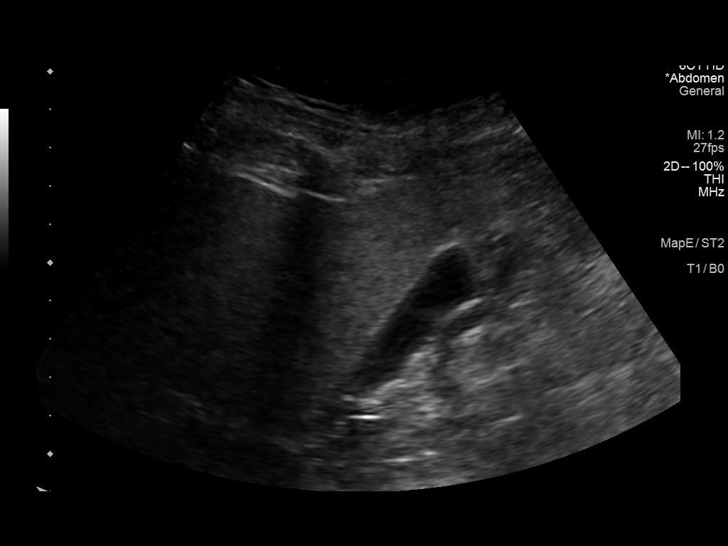
[im 15/40]
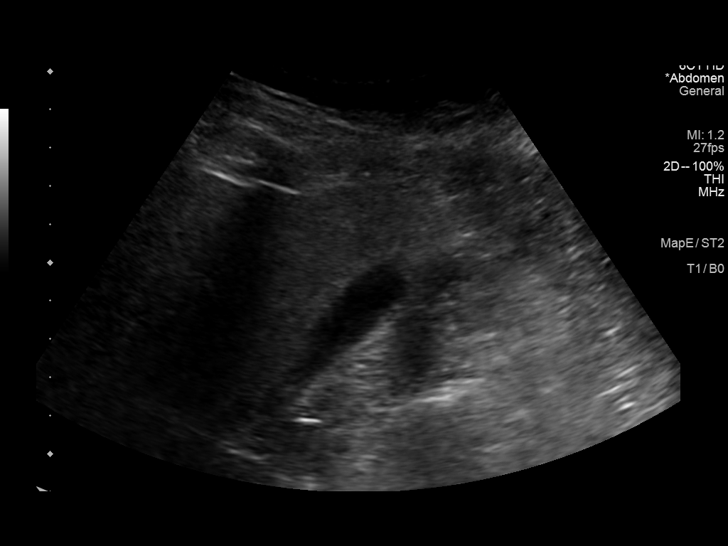
[im 18/40]
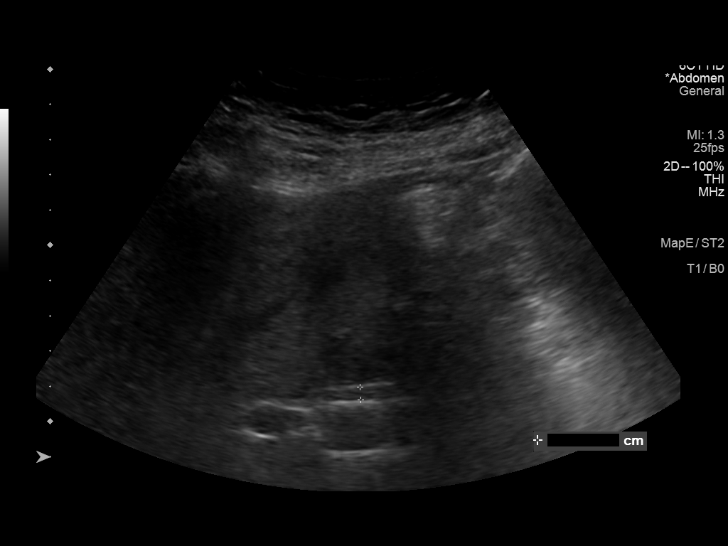
[im 22/40]
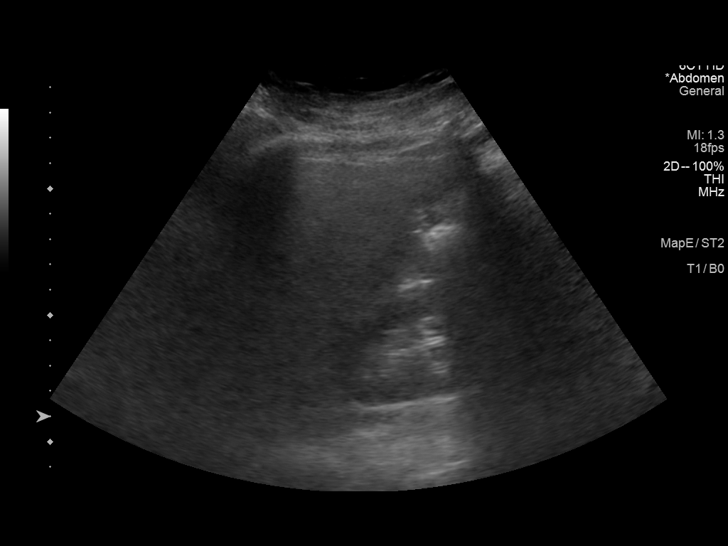
[im 25/40]
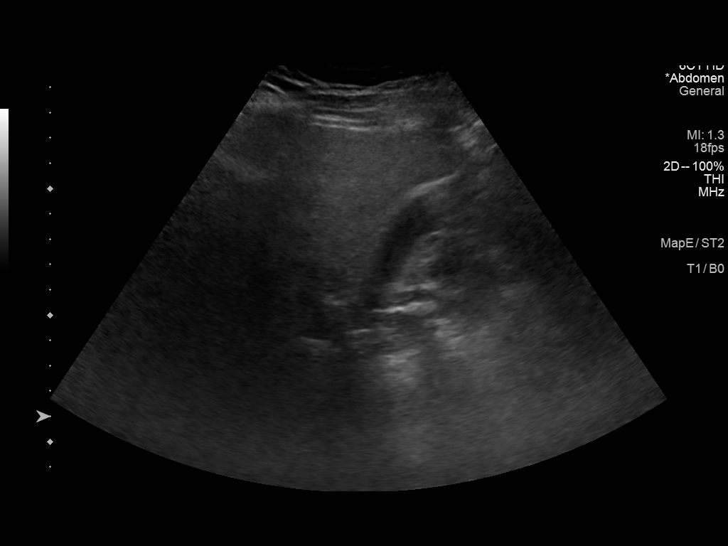
[im 27/40]
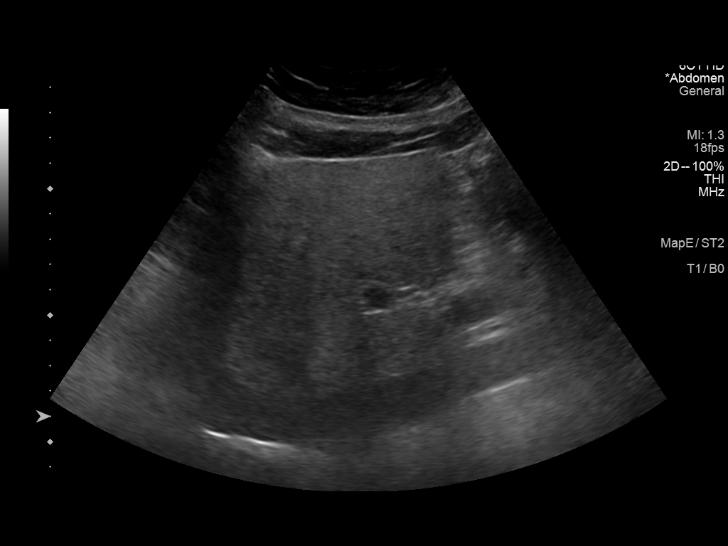
[im 30/40]
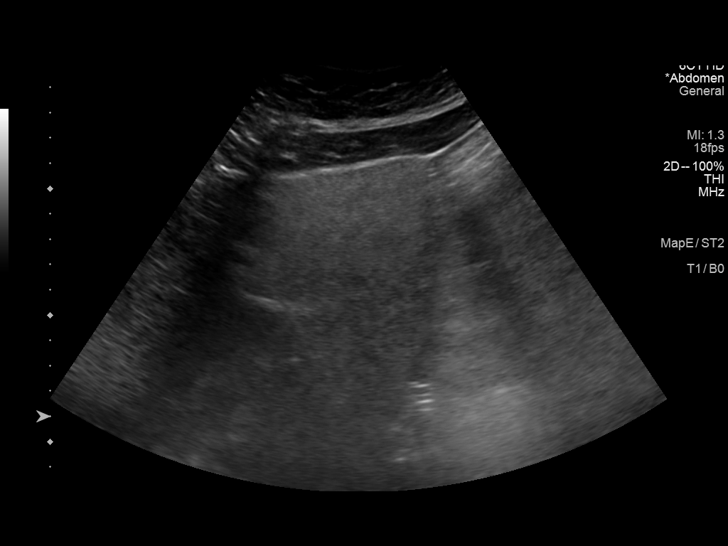
[im 33/40]
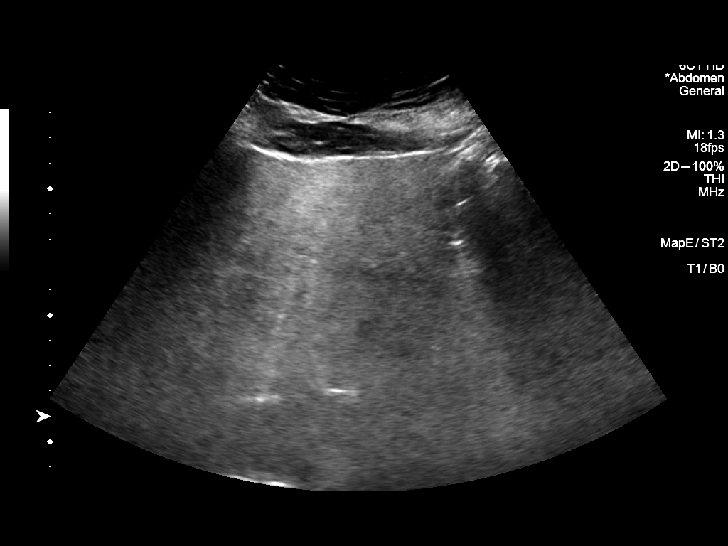
[im 36/40]
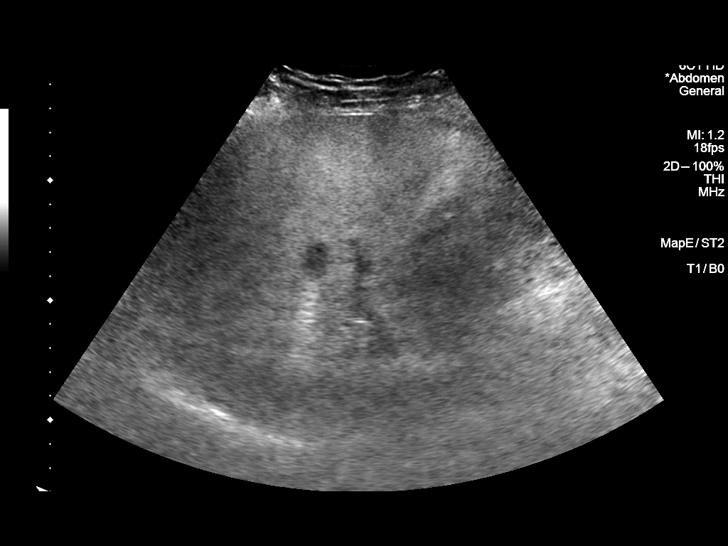
[im 40/40]
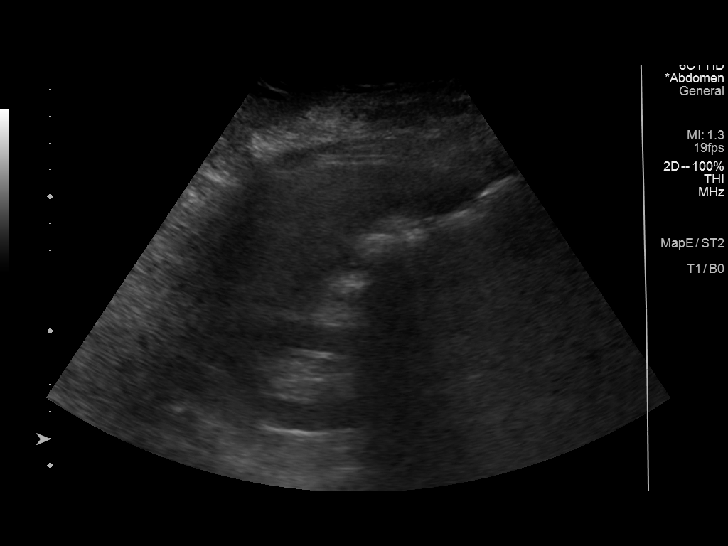

[14 of 25 positions shown; findings below may reference images not displayed]

FINDINGS: Gallbladder:

No gallstones or wall thickening visualized. There is no
pericholecystic fluid. No sonographic Murphy sign noted by
sonographer.

Common bile duct:

Diameter: 4 mm. No intrahepatic or extrahepatic biliary duct
dilatation.

Liver:

No focal lesion identified. Liver echogenicity is increased
diffusely.
IMPRESSION: Diffuse increase in liver echogenicity, a finding most likely
indicative of hepatic steatosis. While no focal liver lesions are
evident on this study, it must be cautioned that the sensitivity of
ultrasound for detection of focal liver lesions is diminished in
this circumstance. Study otherwise unremarkable.

## 2019-08-27 DIAGNOSIS — H5213 Myopia, bilateral: Secondary | ICD-10-CM | POA: Diagnosis not present

## 2019-09-02 ENCOUNTER — Other Ambulatory Visit (HOSPITAL_COMMUNITY): Payer: Self-pay | Admitting: Orthopedic Surgery

## 2019-09-02 MED FILL — PANTOPRAZOLE SOD DR 40 MG T: 40 | 90 days supply | Qty: 90 | Fill #3

## 2019-09-02 MED FILL — ROSUVASTATIN CALCIUM 20 MG: 20 | 90 days supply | Qty: 90 | Fill #3

## 2019-09-02 MED FILL — MELOXICAM 15 MG TABLET: 15 | 90 days supply | Qty: 90 | Fill #0

## 2019-09-02 MED FILL — CYCLOBENZAPRINE HCL 10 MG T: 10 | 30 days supply | Qty: 90 | Fill #1

## 2019-09-03 MED FILL — MYRBETRIQ ER 50 MG TABLET: 50 | 90 days supply | Qty: 90 | Fill #3

## 2019-10-06 ENCOUNTER — Other Ambulatory Visit: Payer: Self-pay | Admitting: Neurology

## 2019-10-06 MED FILL — VENLAFAXINE HCL ER 150 MG C: 150 | 90 days supply | Qty: 90 | Fill #3

## 2019-10-06 MED FILL — AMITRIPTYLINE HCL 25 MG TAB: 25 | 90 days supply | Qty: 180 | Fill #3

## 2019-11-25 ENCOUNTER — Other Ambulatory Visit: Payer: Self-pay | Admitting: General Surgery

## 2019-11-25 DIAGNOSIS — Z1231 Encounter for screening mammogram for malignant neoplasm of breast: Secondary | ICD-10-CM

## 2019-11-30 ENCOUNTER — Other Ambulatory Visit: Payer: Self-pay

## 2019-11-30 ENCOUNTER — Ambulatory Visit
Admission: RE | Admit: 2019-11-30 | Discharge: 2019-11-30 | Disposition: A | Discharge status: Home / Self Care | Payer: 59 | Source: Ambulatory Visit | Attending: General Surgery | Admitting: General Surgery

## 2019-11-30 DIAGNOSIS — Z1231 Encounter for screening mammogram for malignant neoplasm of breast: Secondary | ICD-10-CM

## 2019-12-08 ENCOUNTER — Other Ambulatory Visit (HOSPITAL_COMMUNITY): Payer: Self-pay | Admitting: Family Medicine

## 2019-12-08 MED FILL — GABAPENTIN 300 MG CAPSULE: 300 | 90 days supply | Qty: 180 | Fill #0

## 2019-12-08 MED FILL — MYRBETRIQ ER 25 MG TABLET: 25 | 90 days supply | Qty: 90 | Fill #0

## 2019-12-08 MED FILL — ROSUVASTATIN CALCIUM 20 MG: 20 | 90 days supply | Qty: 90 | Fill #0

## 2019-12-08 MED FILL — PANTOPRAZOLE SOD DR 40 MG T: 40 | 90 days supply | Qty: 90 | Fill #0

## 2019-12-08 MED FILL — CYCLOBENZAPRINE HCL 10 MG T: 10 | 30 days supply | Qty: 90 | Fill #0

## 2019-12-11 MED FILL — MELOXICAM 15 MG TABLET: 15 | 90 days supply | Qty: 90 | Fill #1

## 2020-01-11 MED FILL — AMITRIPTYLINE HCL 25 MG TAB: 25 | 90 days supply | Qty: 180 | Fill #0

## 2020-01-11 MED FILL — VENLAFAXINE HCL ER 150 MG C: 150 | 90 days supply | Qty: 90 | Fill #0

## 2020-01-14 ENCOUNTER — Telehealth: Payer: 59 | Admitting: Orthopedic Surgery

## 2020-01-14 DIAGNOSIS — R399 Unspecified symptoms and signs involving the genitourinary system: Secondary | ICD-10-CM | POA: Diagnosis not present

## 2020-01-14 MED ORDER — NITROFURANTOIN MONOHYD MACRO 100 MG PO CAPS: 100.0000 mg | ORAL_CAPSULE | Freq: Two times a day (BID) | ORAL | 0 refills | Status: AC

## 2020-01-14 NOTE — Progress Notes (Signed)

## 2020-02-23 DIAGNOSIS — Z0000 Encounter for general adult medical examination without abnormal findings: Secondary | ICD-10-CM | POA: Diagnosis not present

## 2020-02-23 DIAGNOSIS — G620 Drug-induced polyneuropathy: Secondary | ICD-10-CM | POA: Diagnosis not present

## 2020-02-23 DIAGNOSIS — E559 Vitamin D deficiency, unspecified: Secondary | ICD-10-CM | POA: Diagnosis not present

## 2020-02-23 DIAGNOSIS — K219 Gastro-esophageal reflux disease without esophagitis: Secondary | ICD-10-CM | POA: Diagnosis not present

## 2020-02-23 DIAGNOSIS — M1990 Unspecified osteoarthritis, unspecified site: Secondary | ICD-10-CM | POA: Diagnosis not present

## 2020-02-23 DIAGNOSIS — R7303 Prediabetes: Secondary | ICD-10-CM | POA: Diagnosis not present

## 2020-02-23 DIAGNOSIS — R609 Edema, unspecified: Secondary | ICD-10-CM | POA: Diagnosis not present

## 2020-02-23 DIAGNOSIS — E782 Mixed hyperlipidemia: Secondary | ICD-10-CM | POA: Diagnosis not present

## 2020-02-29 ENCOUNTER — Other Ambulatory Visit (HOSPITAL_COMMUNITY): Payer: Self-pay | Admitting: Family Medicine

## 2020-02-29 MED FILL — PANTOPRAZOLE SOD DR 40 MG T: 40 | 90 days supply | Qty: 90 | Fill #0

## 2020-02-29 MED FILL — ROSUVASTATIN CALCIUM 20 MG: 20 | 90 days supply | Qty: 90 | Fill #0

## 2020-02-29 MED FILL — valACYclovir HCL 1 GM TABS: 1 | 6 days supply | Qty: 24 | Fill #0

## 2020-03-01 ENCOUNTER — Other Ambulatory Visit (HOSPITAL_COMMUNITY): Payer: Self-pay | Admitting: Urology

## 2020-03-01 MED FILL — MYRBETRIQ ER 50 MG TABLET: 50 | 90 days supply | Qty: 90 | Fill #0

## 2020-03-23 MED FILL — MELOXICAM 15 MG TABLET: 15 | 90 days supply | Qty: 90 | Fill #2

## 2020-03-23 MED FILL — GABAPENTIN 300 MG CAPSULE: 300 | 90 days supply | Qty: 180 | Fill #0

## 2020-04-08 ENCOUNTER — Telehealth: Payer: 59 | Admitting: Nurse Practitioner

## 2020-04-08 DIAGNOSIS — R399 Unspecified symptoms and signs involving the genitourinary system: Secondary | ICD-10-CM | POA: Diagnosis not present

## 2020-04-08 MED ORDER — CEPHALEXIN 500 MG PO CAPS: 500.0000 mg | ORAL_CAPSULE | Freq: Two times a day (BID) | ORAL | 0 refills | Status: DC

## 2020-04-08 NOTE — Progress Notes (Signed)
We are sorry that you are not feeling well.  Here is how we plan to help!  Based on what you shared with me it looks like you most likely have a simple urinary tract infection.  A UTI (Urinary Tract Infection) is a bacterial infection of the bladder.  Most cases of urinary tract infections are simple to treat but a key part of your care is to encourage you to drink plenty of fluids and watch your symptoms carefully.  *we usually would not treat a UTI with associated back pin in an evisit. However , with urgent cares being over ran by covid right now, I will go ahead and treat you. If you are no better by Monday morning you need to see  yur PCP for a urine culture'  I have prescribed Keflex 500 mg twice a day for10 days.  Your symptoms should gradually improve. Call us if the burning in your urine worsens, you develop worsening fever, back pain or pelvic pain or if your symptoms do not resolve after completing the antibiotic.  Urinary tract infections can be prevented by drinking plenty of water to keep your body hydrated.  Also be sure when you wipe, wipe from front to back and don't hold it in!  If possible, empty your bladder every 4 hours.  Your e-visit answers were reviewed by a board certified advanced clinical practitioner to complete your personal care plan.  Depending on the condition, your plan could have included both over the counter or prescription medications.  If there is a problem please reply  once you have received a response from your provider.  Your safety is important to [place].  If you have drug allergies check your prescription carefully.    You can use MyChart to ask questions about today's visit, request a non-urgent call back, or ask for a work or school excuse for 24 hours related to this e-Visit. If it has been greater than 24 hours you will need to follow up with your provider, or enter a new e-Visit to address those concerns.   You will get an e-mail in the next two  days asking about your experience.  I hope that your e-visit has been valuable and will speed your recovery. Thank you for using e-visits.  5-10 minutes spent reviewing and documenting in chart.

## 2020-04-20 MED FILL — VENLAFAXINE HCL ER 150 MG C: 150 | 90 days supply | Qty: 90 | Fill #0

## 2020-04-20 MED FILL — AMITRIPTYLINE HCL 25 MG TAB: 25 | 90 days supply | Qty: 180 | Fill #0

## 2020-06-14 ENCOUNTER — Other Ambulatory Visit (HOSPITAL_COMMUNITY): Payer: Self-pay | Admitting: Urology

## 2020-06-20 MED FILL — GABAPENTIN 300 MG CAPSULE: 300 | 90 days supply | Qty: 180 | Fill #1

## 2020-07-22 ENCOUNTER — Other Ambulatory Visit (HOSPITAL_COMMUNITY): Payer: Self-pay

## 2020-07-22 MED ORDER — CHLORHEXIDINE GLUCONATE 0.12 % MT SOLN
Freq: Three times a day (TID) | OROMUCOSAL | 0 refills | Status: AC
  Filled 2020-07-22: qty 473, 16d supply, fill #0

## 2020-07-22 MED ORDER — PENICILLIN V POTASSIUM 500 MG PO TABS
500.0000 mg | ORAL_TABLET | Freq: Four times a day (QID) | ORAL | 0 refills | Status: DC
  Filled 2020-07-22: qty 30, 8d supply, fill #0

## 2020-07-31 MED FILL — Amitriptyline HCl Tab 25 MG: ORAL | 90 days supply | Qty: 180 | Fill #0 | Status: AC

## 2020-07-31 MED FILL — Venlafaxine HCl Cap ER 24HR 150 MG (Base Equivalent): ORAL | 90 days supply | Qty: 90 | Fill #0 | Status: AC

## 2020-08-01 ENCOUNTER — Other Ambulatory Visit (HOSPITAL_COMMUNITY): Payer: Self-pay

## 2020-08-10 ENCOUNTER — Telehealth: Payer: 59 | Admitting: Physician Assistant

## 2020-08-10 ENCOUNTER — Other Ambulatory Visit (HOSPITAL_COMMUNITY): Payer: Self-pay

## 2020-08-10 DIAGNOSIS — R300 Dysuria: Secondary | ICD-10-CM

## 2020-08-10 MED ORDER — CEPHALEXIN 500 MG PO CAPS
500.0000 mg | ORAL_CAPSULE | Freq: Two times a day (BID) | ORAL | 0 refills | Status: AC
  Filled 2020-08-10: qty 14, 7d supply, fill #0

## 2020-08-10 NOTE — Progress Notes (Signed)
I have spent 5 minutes in review of e-visit questionnaire, review and updating patient chart, medical decision making and response to patient.   Maicee Ullman Cody Chaunce Winkels, PA-C    

## 2020-08-10 NOTE — Progress Notes (Signed)

## 2020-09-07 ENCOUNTER — Telehealth: Payer: 59 | Admitting: Nurse Practitioner

## 2020-09-07 DIAGNOSIS — R059 Cough, unspecified: Secondary | ICD-10-CM | POA: Diagnosis not present

## 2020-09-07 DIAGNOSIS — N390 Urinary tract infection, site not specified: Secondary | ICD-10-CM | POA: Diagnosis not present

## 2020-09-07 DIAGNOSIS — J069 Acute upper respiratory infection, unspecified: Secondary | ICD-10-CM | POA: Diagnosis not present

## 2020-09-07 DIAGNOSIS — R300 Dysuria: Secondary | ICD-10-CM

## 2020-09-07 NOTE — Progress Notes (Signed)
Based on what you shared with me it looks like you have recurrent UTI,that should be evaluated in a face to face office visit. You need urine culture and urinalysis to make sure you are treated with correct antibiotic.   NOTE: If you entered your credit card information for this eVisit, you will not be charged. You may see a "hold" on your card for the $35 but that hold will drop off and you will not have a charge processed.   If you are having a true medical emergency please call 911.      For an urgent face to face visit, [place] has six urgent care centers for your convenience:     [place] [facility] [place] [facility] [place] Get Driving Directions [phone] [address] [place], [place] [address] . 8 am - 4 pm Monday - Friday    [place] [facility] [place] [facility]) Get Driving Directions [phone] [address] [place], [place] [address] . 8 am to 8 pm Monday-Friday . 10 am to 6 pm [facility]) Get Driving Directions [phone]  [address] [place],  [place]  [address] . 8 am to 8 pm Monday-Friday . 8 am to 4 pm [facility]  Get Driving Directions [phone] [place], [place], [place] [address] . 8 am to 8 pm Monday-Friday . 8 am to 4 pm [facility] [place] Get Driving Directions  [phone] [address] [place], [place] [address] . 8 am to 8 pm Monday-Friday . 8 am to 4 pm [facility] Urgent Care at [place] Get Driving Directions [phone] [address] [place], [place] [address] . 8 am to 8 pm Monday-Friday . 8 am to 4 pm Saturday-Sunday     Your MyChart E-visit questionnaire answers were reviewed by a board certified advanced clinical practitioner to complete your personal care plan  based on your specific symptoms.  Thank you for using e-Visits.

## 2020-09-11 MED FILL — Rosuvastatin Calcium Tab 20 MG: ORAL | 90 days supply | Qty: 90 | Fill #0 | Status: AC

## 2020-09-12 ENCOUNTER — Other Ambulatory Visit (HOSPITAL_COMMUNITY): Payer: Self-pay | Admitting: Orthopedic Surgery

## 2020-09-12 ENCOUNTER — Other Ambulatory Visit (HOSPITAL_COMMUNITY): Payer: Self-pay

## 2020-09-12 MED FILL — Pantoprazole Sodium EC Tab 40 MG (Base Equiv): ORAL | 90 days supply | Qty: 90 | Fill #0 | Status: AC

## 2020-09-13 ENCOUNTER — Other Ambulatory Visit (HOSPITAL_COMMUNITY): Payer: Self-pay

## 2020-09-13 MED ORDER — MELOXICAM 15 MG PO TABS
15.0000 mg | ORAL_TABLET | Freq: Every day | ORAL | 3 refills | Status: DC
  Filled 2020-09-13: qty 90, 90d supply, fill #0
  Filled 2020-12-12: qty 90, 90d supply, fill #1
  Filled 2021-03-20: qty 90, 90d supply, fill #2
  Filled 2021-06-17: qty 90, 90d supply, fill #3

## 2020-09-14 ENCOUNTER — Other Ambulatory Visit (HOSPITAL_COMMUNITY): Payer: Self-pay

## 2020-09-14 MED ORDER — CYCLOBENZAPRINE HCL 10 MG PO TABS
10.0000 mg | ORAL_TABLET | Freq: Three times a day (TID) | ORAL | 0 refills | Status: DC
  Filled 2020-09-14: qty 90, 30d supply, fill #0

## 2020-09-15 ENCOUNTER — Other Ambulatory Visit (HOSPITAL_COMMUNITY): Payer: Self-pay

## 2020-09-15 MED FILL — Gabapentin Cap 300 MG: ORAL | 90 days supply | Qty: 180 | Fill #0 | Status: CN

## 2020-09-16 ENCOUNTER — Other Ambulatory Visit (HOSPITAL_COMMUNITY): Payer: Self-pay

## 2020-09-16 MED FILL — Gabapentin Cap 300 MG: ORAL | 90 days supply | Qty: 180 | Fill #0 | Status: AC

## 2020-09-17 ENCOUNTER — Telehealth: Payer: 59 | Admitting: Nurse Practitioner

## 2020-09-17 DIAGNOSIS — R300 Dysuria: Secondary | ICD-10-CM

## 2020-09-17 DIAGNOSIS — R3989 Other symptoms and signs involving the genitourinary system: Secondary | ICD-10-CM | POA: Diagnosis not present

## 2020-09-17 DIAGNOSIS — N390 Urinary tract infection, site not specified: Secondary | ICD-10-CM | POA: Diagnosis not present

## 2020-09-17 NOTE — Progress Notes (Signed)
Based on what you shared with me it looks like you have recurrent uti,that should be evaluated in a face to face office visit. You need a urinalysis and another urine culture for proper treatment.   NOTE: If you entered your credit card information for this eVisit, you will not be charged. You may see a "hold" on your card for the $35 but that hold will drop off and you will not have a charge processed.   If you are having a true medical emergency please call 911.      For an urgent face to face visit, [place] has six urgent care centers for your convenience:     [place] [facility] [place] Get Driving Directions [phone] [place], [place] [address] 8 am - 4 pm Monday - Friday    [place] [facility]) Get Driving Directions [phone] [place], [place] [address] 8 am to 8 pm Monday-Friday 10 am to 6 pm [facility] [place] ([place]) Get Driving Directions [phone]  [address] [place],  [place]  [address] 8 am to 8 pm Monday-Friday 8 am to 4 pm [facility] [place] Get Driving Directions [phone] [place], [place], [place] [address] 8 am to 8 pm Monday-Friday 8 am to 4 pm [facility] [place] Get Driving Directions  [phone] [address] [place], [place] [address] 8 am to 8 pm Monday-Friday 8 am to 4 pm [facility] at [place] Get Driving Directions [phone] [address] [place], [place] [address] 8 am to 8 pm Monday-Friday 8 am to 4 pm Saturday-Sunday     Your MyChart E-visit questionnaire answers were reviewed by a board certified advanced clinical practitioner to complete your personal care plan based on your specific symptoms.  Thank you for  using e-Visits.

## 2020-09-22 ENCOUNTER — Other Ambulatory Visit (HOSPITAL_COMMUNITY): Payer: Self-pay

## 2020-09-22 MED FILL — Mirabegron Tab ER 24 HR 50 MG: ORAL | 90 days supply | Qty: 90 | Fill #0 | Status: CN

## 2020-09-23 ENCOUNTER — Other Ambulatory Visit (HOSPITAL_COMMUNITY): Payer: Self-pay

## 2020-09-26 ENCOUNTER — Other Ambulatory Visit (HOSPITAL_COMMUNITY): Payer: Self-pay

## 2020-09-26 MED FILL — Mirabegron Tab ER 24 HR 50 MG: ORAL | 90 days supply | Qty: 90 | Fill #0 | Status: CN

## 2020-09-26 MED FILL — Mirabegron Tab ER 24 HR 50 MG: ORAL | 90 days supply | Qty: 90 | Fill #0 | Status: AC

## 2020-09-26 MED FILL — Mirabegron Tab ER 24 HR 50 MG: ORAL | 30 days supply | Qty: 30 | Fill #0 | Status: CN

## 2020-10-11 ENCOUNTER — Ambulatory Visit: Payer: 59 | Admitting: Plastic Surgery

## 2020-10-12 ENCOUNTER — Encounter: Payer: Self-pay | Admitting: Plastic Surgery

## 2020-10-12 ENCOUNTER — Ambulatory Visit: Payer: 59 | Admitting: Plastic Surgery

## 2020-10-12 ENCOUNTER — Other Ambulatory Visit: Payer: Self-pay

## 2020-10-12 ENCOUNTER — Other Ambulatory Visit (HOSPITAL_COMMUNITY)
Admission: RE | Admit: 2020-10-12 | Discharge: 2020-10-12 | Disposition: A | Discharge status: Home / Self Care | Payer: 59 | Source: Ambulatory Visit | Attending: Plastic Surgery | Admitting: Plastic Surgery

## 2020-10-12 VITALS — BP 131/80 | HR 90

## 2020-10-12 DIAGNOSIS — L570 Actinic keratosis: Secondary | ICD-10-CM | POA: Diagnosis not present

## 2020-10-12 DIAGNOSIS — L821 Other seborrheic keratosis: Secondary | ICD-10-CM | POA: Diagnosis not present

## 2020-10-12 DIAGNOSIS — L989 Disorder of the skin and subcutaneous tissue, unspecified: Secondary | ICD-10-CM | POA: Diagnosis not present

## 2020-10-12 DIAGNOSIS — Z719 Counseling, unspecified: Secondary | ICD-10-CM

## 2020-10-12 DIAGNOSIS — D0461 Carcinoma in situ of skin of right upper limb, including shoulder: Secondary | ICD-10-CM | POA: Diagnosis not present

## 2020-10-12 NOTE — Progress Notes (Signed)
Operative Note   DATE OF OPERATION: 10/12/2020  LOCATION:    SURGICAL DEPARTMENT: Plastic Surgery  PREOPERATIVE DIAGNOSES: Skin lesions  POSTOPERATIVE DIAGNOSES:  same  PROCEDURE:  Excision of skin lesions from left medial canthus, central chest, and right index finger measuring 1.5, 3, 1.5 cm respectively Complex closure measuring 1.5, 3, 1.5 cm corresponding to the 3 sites  SURGEON: Talmadge Coventry, MD  ANESTHESIA:  Local  COMPLICATIONS: None.   INDICATIONS FOR PROCEDURE:  The patient, Barbara Trevino is a 60 y.o. female born on 1961-01-27, is here for treatment of skin lesions that of been present for several months.  She seems to be growing quite a few of these across her body and wants to make sure they are nothing worrisome.  The one on her finger is been there for quite some time and is getting larger. MRN: [id number]  CONSENT:  Informed consent was obtained directly from the patient. Risks, benefits and alternatives were fully discussed. Specific risks including but not limited to bleeding, infection, hematoma, seroma, scarring, pain, infection, wound healing problems, and need for further surgery were all discussed. The patient did have an ample opportunity to have questions answered to satisfaction.   DESCRIPTION OF PROCEDURE:  Local anesthesia was administered. The patient's operative site was prepped and draped in a sterile fashion. A time out was performed and all information was confirmed to be correct.  The lesions were excised with a 15 blade.  Hemostasis was obtained.  Circumferential undermining was performed and the skin was advanced and closed in layers with interrupted buried Monocryl sutures and 5-0 fast gut for the skin.    The patient tolerated the procedure well.  There were no complications.

## 2020-10-18 LAB — SURGICAL PATHOLOGY

## 2020-10-26 ENCOUNTER — Other Ambulatory Visit: Payer: Self-pay

## 2020-10-26 ENCOUNTER — Other Ambulatory Visit (HOSPITAL_COMMUNITY)
Admission: RE | Admit: 2020-10-26 | Discharge: 2020-10-26 | Disposition: A | Discharge status: Home / Self Care | Payer: 59 | Source: Ambulatory Visit | Attending: Plastic Surgery | Admitting: Plastic Surgery

## 2020-10-26 ENCOUNTER — Ambulatory Visit: Payer: 59 | Admitting: Plastic Surgery

## 2020-10-26 ENCOUNTER — Encounter: Payer: Self-pay | Admitting: Plastic Surgery

## 2020-10-26 VITALS — BP 142/78 | HR 89 | Ht 70.0 in | Wt 288.0 lb

## 2020-10-26 DIAGNOSIS — L989 Disorder of the skin and subcutaneous tissue, unspecified: Secondary | ICD-10-CM | POA: Diagnosis not present

## 2020-10-26 DIAGNOSIS — L570 Actinic keratosis: Secondary | ICD-10-CM | POA: Diagnosis not present

## 2020-10-26 NOTE — Progress Notes (Signed)
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   INDICATIONS FOR PROCEDURE:  The patient, Barbara Trevino is a 60 y.o. female born on June 28, 1960, is here for treatment of squamous cell carcinoma in situ the right index finger.  She had a suspicious lesion in that area which I excised a couple weeks ago and pathology yielded squamous cell carcinoma in situ with a positive margin.  She presents for reexcision. MRN: [id number]  CONSENT:  Informed consent was obtained directly from the patient. Risks, benefits and alternatives were fully discussed. Specific risks including but not limited to bleeding, infection, hematoma, seroma, scarring, pain, infection, wound healing problems, and need for further surgery were all discussed. The patient did have an ample opportunity to have questions answered to satisfaction.   DESCRIPTION OF PROCEDURE:  Local anesthesia was administered. The patient's operative site was prepped and draped in a sterile fashion. A time out was performed and all information was confirmed to be correct.  The lesion was excised with a 15 blade.  Hemostasis was obtained.  Specimen was marked with a suture at 12:00 which was placed in the distal aspect of the ellipse.  Circumferential undermining was performed and the skin was advanced and closed with 4-0 nylon for the skin.  The lesion excised measured 2 cm, and the total length of closure measured 2 cm.    The patient tolerated the procedure well.  There were no complications.

## 2020-10-27 ENCOUNTER — Other Ambulatory Visit (HOSPITAL_COMMUNITY): Payer: Self-pay

## 2020-10-27 MED ORDER — TRAMADOL HCL 50 MG PO TABS
50.0000 mg | ORAL_TABLET | Freq: Three times a day (TID) | ORAL | 0 refills | Status: AC | PRN
  Filled 2020-10-27: qty 15, 5d supply, fill #0

## 2020-10-27 NOTE — Addendum Note (Signed)
Addended by: Cindra Presume on: 10/27/2020 09:24 AM   Modules accepted: Orders

## 2020-10-31 LAB — SURGICAL PATHOLOGY

## 2020-11-07 MED FILL — Venlafaxine HCl Cap ER 24HR 150 MG (Base Equivalent): ORAL | 90 days supply | Qty: 90 | Fill #1 | Status: AC

## 2020-11-08 ENCOUNTER — Other Ambulatory Visit (HOSPITAL_COMMUNITY): Payer: Self-pay

## 2020-11-08 MED ORDER — AMITRIPTYLINE HCL 25 MG PO TABS
25.0000 mg | ORAL_TABLET | Freq: Every day | ORAL | 0 refills | Status: DC
  Filled 2020-11-08: qty 180, 90d supply, fill #0

## 2020-11-25 ENCOUNTER — Telehealth: Payer: 59 | Admitting: Physician Assistant

## 2020-11-25 ENCOUNTER — Other Ambulatory Visit (HOSPITAL_COMMUNITY): Payer: Self-pay

## 2020-11-25 DIAGNOSIS — R300 Dysuria: Secondary | ICD-10-CM | POA: Diagnosis not present

## 2020-11-25 MED ORDER — NITROFURANTOIN MONOHYD MACRO 100 MG PO CAPS
100.0000 mg | ORAL_CAPSULE | Freq: Two times a day (BID) | ORAL | 0 refills | Status: DC
Start: 2020-11-25 — End: 2021-09-02
  Filled 2020-11-25: qty 10, 5d supply, fill #0

## 2020-11-25 MED ORDER — CEPHALEXIN 500 MG PO CAPS
500.0000 mg | ORAL_CAPSULE | Freq: Two times a day (BID) | ORAL | 0 refills | Status: AC
  Filled 2020-11-25: qty 14, 7d supply, fill #0

## 2020-11-25 NOTE — Progress Notes (Signed)

## 2020-11-25 NOTE — Addendum Note (Signed)
Addended by: Brunetta Jeans on: 11/25/2020 10:17 AM   Modules accepted: Orders

## 2020-11-25 NOTE — Progress Notes (Signed)
I have spent 5 minutes in review of e-visit questionnaire, review and updating patient chart, medical decision making and response to patient.   Venise Ellingwood Cody Dagmar Adcox, PA-C    

## 2020-11-28 DIAGNOSIS — Z83518 Family history of other specified eye disorder: Secondary | ICD-10-CM | POA: Diagnosis not present

## 2020-11-28 DIAGNOSIS — H5213 Myopia, bilateral: Secondary | ICD-10-CM | POA: Diagnosis not present

## 2020-11-28 DIAGNOSIS — H40003 Preglaucoma, unspecified, bilateral: Secondary | ICD-10-CM | POA: Diagnosis not present

## 2020-12-07 ENCOUNTER — Other Ambulatory Visit (HOSPITAL_COMMUNITY): Payer: Self-pay

## 2020-12-08 ENCOUNTER — Telehealth: Payer: 59 | Admitting: Family Medicine

## 2020-12-08 DIAGNOSIS — N390 Urinary tract infection, site not specified: Secondary | ICD-10-CM

## 2020-12-08 DIAGNOSIS — R3989 Other symptoms and signs involving the genitourinary system: Secondary | ICD-10-CM | POA: Diagnosis not present

## 2020-12-08 DIAGNOSIS — N3001 Acute cystitis with hematuria: Secondary | ICD-10-CM | POA: Diagnosis not present

## 2020-12-08 NOTE — Progress Notes (Signed)
[place]   Recurrent UTI needs in office visit

## 2020-12-12 DIAGNOSIS — Z83518 Family history of other specified eye disorder: Secondary | ICD-10-CM | POA: Diagnosis not present

## 2020-12-12 DIAGNOSIS — H40013 Open angle with borderline findings, low risk, bilateral: Secondary | ICD-10-CM | POA: Diagnosis not present

## 2020-12-12 MED FILL — Gabapentin Cap 300 MG: ORAL | 90 days supply | Qty: 180 | Fill #1 | Status: AC

## 2020-12-12 MED FILL — Pantoprazole Sodium EC Tab 40 MG (Base Equiv): ORAL | 90 days supply | Qty: 90 | Fill #1 | Status: AC

## 2020-12-12 MED FILL — Mirabegron Tab ER 24 HR 50 MG: ORAL | 90 days supply | Qty: 90 | Fill #1 | Status: AC

## 2020-12-12 MED FILL — Rosuvastatin Calcium Tab 20 MG: ORAL | 90 days supply | Qty: 90 | Fill #1 | Status: AC

## 2020-12-13 ENCOUNTER — Other Ambulatory Visit (HOSPITAL_COMMUNITY): Payer: Self-pay

## 2020-12-22 ENCOUNTER — Other Ambulatory Visit (HOSPITAL_COMMUNITY): Payer: Self-pay

## 2020-12-22 MED ORDER — CYCLOBENZAPRINE HCL 10 MG PO TABS
10.0000 mg | ORAL_TABLET | Freq: Three times a day (TID) | ORAL | 1 refills | Status: DC
  Filled 2020-12-22: qty 90, 30d supply, fill #0
  Filled 2021-02-02: qty 90, 30d supply, fill #1

## 2020-12-23 ENCOUNTER — Other Ambulatory Visit: Payer: Self-pay | Admitting: General Surgery

## 2020-12-23 DIAGNOSIS — Z1231 Encounter for screening mammogram for malignant neoplasm of breast: Secondary | ICD-10-CM

## 2021-01-03 ENCOUNTER — Other Ambulatory Visit: Payer: Self-pay

## 2021-01-03 ENCOUNTER — Ambulatory Visit
Admission: RE | Admit: 2021-01-03 | Discharge: 2021-01-03 | Disposition: A | Discharge status: Home / Self Care | Payer: 59 | Source: Ambulatory Visit | Attending: General Surgery | Admitting: General Surgery

## 2021-01-03 DIAGNOSIS — Z1231 Encounter for screening mammogram for malignant neoplasm of breast: Secondary | ICD-10-CM

## 2021-01-19 DIAGNOSIS — B9620 Unspecified Escherichia coli [E. coli] as the cause of diseases classified elsewhere: Secondary | ICD-10-CM | POA: Diagnosis not present

## 2021-01-19 DIAGNOSIS — N390 Urinary tract infection, site not specified: Secondary | ICD-10-CM | POA: Diagnosis not present

## 2021-01-23 ENCOUNTER — Other Ambulatory Visit (HOSPITAL_COMMUNITY): Payer: Self-pay

## 2021-01-23 MED ORDER — NITROFURANTOIN MONOHYD MACRO 100 MG PO CAPS
100.0000 mg | ORAL_CAPSULE | Freq: Two times a day (BID) | ORAL | 0 refills | Status: DC
  Filled 2021-01-23: qty 14, 7d supply, fill #0

## 2021-01-30 DIAGNOSIS — Z8551 Personal history of malignant neoplasm of bladder: Secondary | ICD-10-CM | POA: Diagnosis not present

## 2021-01-30 DIAGNOSIS — N3020 Other chronic cystitis without hematuria: Secondary | ICD-10-CM | POA: Diagnosis not present

## 2021-02-02 ENCOUNTER — Other Ambulatory Visit (HOSPITAL_COMMUNITY): Payer: Self-pay

## 2021-02-02 DIAGNOSIS — R8271 Bacteriuria: Secondary | ICD-10-CM | POA: Diagnosis not present

## 2021-02-02 MED ORDER — CEPHALEXIN 500 MG PO CAPS
500.0000 mg | ORAL_CAPSULE | Freq: Three times a day (TID) | ORAL | 0 refills | Status: DC
  Filled 2021-02-02: qty 15, 5d supply, fill #0

## 2021-02-02 MED FILL — Venlafaxine HCl Cap ER 24HR 150 MG (Base Equivalent): ORAL | 90 days supply | Qty: 90 | Fill #2 | Status: AC

## 2021-02-03 ENCOUNTER — Other Ambulatory Visit (HOSPITAL_COMMUNITY): Payer: Self-pay

## 2021-02-03 MED ORDER — AMITRIPTYLINE HCL 25 MG PO TABS
25.0000 mg | ORAL_TABLET | Freq: Every day | ORAL | 0 refills | Status: DC
  Filled 2021-02-03: qty 180, 90d supply, fill #0

## 2021-02-28 ENCOUNTER — Other Ambulatory Visit (HOSPITAL_COMMUNITY): Payer: Self-pay

## 2021-02-28 DIAGNOSIS — E559 Vitamin D deficiency, unspecified: Secondary | ICD-10-CM | POA: Diagnosis not present

## 2021-02-28 DIAGNOSIS — M1990 Unspecified osteoarthritis, unspecified site: Secondary | ICD-10-CM | POA: Diagnosis not present

## 2021-02-28 DIAGNOSIS — K219 Gastro-esophageal reflux disease without esophagitis: Secondary | ICD-10-CM | POA: Diagnosis not present

## 2021-02-28 DIAGNOSIS — B009 Herpesviral infection, unspecified: Secondary | ICD-10-CM | POA: Diagnosis not present

## 2021-02-28 DIAGNOSIS — R609 Edema, unspecified: Secondary | ICD-10-CM | POA: Diagnosis not present

## 2021-02-28 DIAGNOSIS — G620 Drug-induced polyneuropathy: Secondary | ICD-10-CM | POA: Diagnosis not present

## 2021-02-28 DIAGNOSIS — E782 Mixed hyperlipidemia: Secondary | ICD-10-CM | POA: Diagnosis not present

## 2021-02-28 DIAGNOSIS — K760 Fatty (change of) liver, not elsewhere classified: Secondary | ICD-10-CM | POA: Diagnosis not present

## 2021-02-28 DIAGNOSIS — Z0000 Encounter for general adult medical examination without abnormal findings: Secondary | ICD-10-CM | POA: Diagnosis not present

## 2021-02-28 DIAGNOSIS — R7303 Prediabetes: Secondary | ICD-10-CM | POA: Diagnosis not present

## 2021-02-28 MED ORDER — GABAPENTIN 300 MG PO CAPS
600.0000 mg | ORAL_CAPSULE | Freq: Every evening | ORAL | 3 refills | Status: DC
  Filled 2021-02-28 – 2021-03-20 (×2): qty 180, 90d supply, fill #0
  Filled 2021-06-17: qty 180, 90d supply, fill #1
  Filled 2021-09-15 – 2021-10-02 (×2): qty 180, 90d supply, fill #2
  Filled 2021-12-31: qty 180, 90d supply, fill #3

## 2021-02-28 MED ORDER — ROSUVASTATIN CALCIUM 20 MG PO TABS
20.0000 mg | ORAL_TABLET | Freq: Every day | ORAL | 3 refills | Status: DC
  Filled 2021-02-28 – 2021-03-20 (×2): qty 90, 90d supply, fill #0
  Filled 2021-06-17: qty 90, 90d supply, fill #1
  Filled 2021-09-15: qty 90, 90d supply, fill #2
  Filled 2021-12-04: qty 90, 90d supply, fill #3

## 2021-02-28 MED ORDER — VENLAFAXINE HCL ER 150 MG PO CP24
150.0000 mg | ORAL_CAPSULE | Freq: Every day | ORAL | 3 refills | Status: DC
  Filled 2021-02-28 – 2021-04-24 (×2): qty 90, 90d supply, fill #0
  Filled 2021-08-13: qty 90, 90d supply, fill #1
  Filled 2021-11-07: qty 90, 90d supply, fill #2
  Filled 2022-01-15 – 2022-02-05 (×2): qty 90, 90d supply, fill #3

## 2021-02-28 MED ORDER — AMITRIPTYLINE HCL 25 MG PO TABS
25.0000 mg | ORAL_TABLET | Freq: Every day | ORAL | 3 refills | Status: DC
  Filled 2021-02-28 – 2021-04-24 (×2): qty 180, 90d supply, fill #0
  Filled 2021-08-13: qty 180, 90d supply, fill #1
  Filled 2021-11-07: qty 180, 90d supply, fill #2
  Filled 2022-01-15 – 2022-02-05 (×2): qty 180, 90d supply, fill #3

## 2021-02-28 MED ORDER — VALACYCLOVIR HCL 1 G PO TABS
2000.0000 mg | ORAL_TABLET | Freq: Two times a day (BID) | ORAL | 1 refills | Status: DC
  Filled 2021-02-28 – 2021-09-15 (×2): qty 24, 6d supply, fill #0
  Filled 2021-12-04: qty 24, 6d supply, fill #1

## 2021-02-28 MED ORDER — PANTOPRAZOLE SODIUM 40 MG PO TBEC
40.0000 mg | DELAYED_RELEASE_TABLET | Freq: Every day | ORAL | 3 refills | Status: DC
  Filled 2021-02-28 – 2021-03-20 (×2): qty 90, 90d supply, fill #0
  Filled 2021-06-17: qty 90, 90d supply, fill #1
  Filled 2021-09-15: qty 90, 90d supply, fill #2
  Filled 2021-12-04: qty 90, 90d supply, fill #3

## 2021-03-06 ENCOUNTER — Other Ambulatory Visit: Payer: Self-pay | Admitting: Family Medicine

## 2021-03-06 ENCOUNTER — Other Ambulatory Visit (HOSPITAL_COMMUNITY): Payer: Self-pay

## 2021-03-06 DIAGNOSIS — Z13820 Encounter for screening for osteoporosis: Secondary | ICD-10-CM

## 2021-03-20 ENCOUNTER — Other Ambulatory Visit (HOSPITAL_COMMUNITY): Payer: Self-pay

## 2021-03-21 ENCOUNTER — Other Ambulatory Visit (HOSPITAL_COMMUNITY): Payer: Self-pay

## 2021-03-21 MED ORDER — CYCLOBENZAPRINE HCL 10 MG PO TABS
10.0000 mg | ORAL_TABLET | Freq: Three times a day (TID) | ORAL | 2 refills | Status: DC
  Filled 2021-03-21: qty 90, 30d supply, fill #0
  Filled 2021-05-22: qty 90, 30d supply, fill #1
  Filled 2021-07-12: qty 90, 30d supply, fill #2

## 2021-03-21 MED FILL — Mirabegron Tab ER 24 HR 50 MG: ORAL | 90 days supply | Qty: 90 | Fill #2 | Status: AC

## 2021-03-30 DIAGNOSIS — Z853 Personal history of malignant neoplasm of breast: Secondary | ICD-10-CM | POA: Diagnosis not present

## 2021-03-30 DIAGNOSIS — U071 COVID-19: Secondary | ICD-10-CM | POA: Diagnosis not present

## 2021-03-30 DIAGNOSIS — R7303 Prediabetes: Secondary | ICD-10-CM | POA: Diagnosis not present

## 2021-03-30 DIAGNOSIS — R059 Cough, unspecified: Secondary | ICD-10-CM | POA: Diagnosis not present

## 2021-03-30 DIAGNOSIS — Z8551 Personal history of malignant neoplasm of bladder: Secondary | ICD-10-CM | POA: Diagnosis not present

## 2021-03-31 ENCOUNTER — Other Ambulatory Visit (HOSPITAL_COMMUNITY): Payer: Self-pay

## 2021-03-31 MED ORDER — NIRMATRELVIR&RITONAVIR 150/100 10 X 150 MG & 10 X 100MG PO TBPK
ORAL_TABLET | ORAL | 0 refills | Status: DC
  Filled 2021-03-31: qty 30, 5d supply, fill #0
  Filled 2021-03-31: qty 20, 5d supply, fill #0

## 2021-04-11 DIAGNOSIS — N390 Urinary tract infection, site not specified: Secondary | ICD-10-CM | POA: Diagnosis not present

## 2021-04-11 DIAGNOSIS — B9620 Unspecified Escherichia coli [E. coli] as the cause of diseases classified elsewhere: Secondary | ICD-10-CM | POA: Diagnosis not present

## 2021-04-12 ENCOUNTER — Other Ambulatory Visit (HOSPITAL_COMMUNITY): Payer: Self-pay

## 2021-04-12 MED ORDER — NITROFURANTOIN MONOHYD MACRO 100 MG PO CAPS
100.0000 mg | ORAL_CAPSULE | Freq: Two times a day (BID) | ORAL | 0 refills | Status: DC
  Filled 2021-04-12: qty 14, 7d supply, fill #0

## 2021-04-24 ENCOUNTER — Other Ambulatory Visit (HOSPITAL_COMMUNITY): Payer: Self-pay

## 2021-04-24 DIAGNOSIS — N390 Urinary tract infection, site not specified: Secondary | ICD-10-CM | POA: Diagnosis not present

## 2021-04-24 DIAGNOSIS — B9620 Unspecified Escherichia coli [E. coli] as the cause of diseases classified elsewhere: Secondary | ICD-10-CM | POA: Diagnosis not present

## 2021-04-24 DIAGNOSIS — N3000 Acute cystitis without hematuria: Secondary | ICD-10-CM | POA: Diagnosis not present

## 2021-04-24 MED ORDER — CEPHALEXIN 250 MG PO TABS
ORAL_TABLET | Freq: Every day | ORAL | 0 refills | Status: DC
  Filled 2021-04-25: qty 30, 60d supply, fill #0

## 2021-04-24 MED ORDER — DOXYCYCLINE HYCLATE 100 MG PO TABS
100.0000 mg | ORAL_TABLET | Freq: Two times a day (BID) | ORAL | 0 refills | Status: DC
  Filled 2021-04-24: qty 14, 7d supply, fill #0

## 2021-04-25 ENCOUNTER — Other Ambulatory Visit (HOSPITAL_COMMUNITY): Payer: Self-pay

## 2021-04-26 ENCOUNTER — Other Ambulatory Visit (HOSPITAL_COMMUNITY): Payer: Self-pay

## 2021-04-27 ENCOUNTER — Other Ambulatory Visit (HOSPITAL_COMMUNITY): Payer: Self-pay

## 2021-05-23 ENCOUNTER — Other Ambulatory Visit (HOSPITAL_COMMUNITY): Payer: Self-pay

## 2021-05-31 DIAGNOSIS — R7301 Impaired fasting glucose: Secondary | ICD-10-CM | POA: Diagnosis not present

## 2021-06-02 DIAGNOSIS — E1169 Type 2 diabetes mellitus with other specified complication: Secondary | ICD-10-CM | POA: Diagnosis not present

## 2021-06-02 DIAGNOSIS — E782 Mixed hyperlipidemia: Secondary | ICD-10-CM | POA: Diagnosis not present

## 2021-06-13 DIAGNOSIS — H40013 Open angle with borderline findings, low risk, bilateral: Secondary | ICD-10-CM | POA: Diagnosis not present

## 2021-06-17 ENCOUNTER — Other Ambulatory Visit: Payer: Self-pay

## 2021-06-19 ENCOUNTER — Other Ambulatory Visit (HOSPITAL_COMMUNITY): Payer: Self-pay

## 2021-06-20 ENCOUNTER — Other Ambulatory Visit (HOSPITAL_COMMUNITY): Payer: Self-pay

## 2021-06-21 ENCOUNTER — Other Ambulatory Visit (HOSPITAL_COMMUNITY): Payer: Self-pay

## 2021-06-21 MED ORDER — MIRABEGRON ER 50 MG PO TB24
50.0000 mg | ORAL_TABLET | Freq: Every day | ORAL | 3 refills | Status: DC
  Filled 2021-06-21: qty 90, 90d supply, fill #0
  Filled 2021-09-15: qty 90, 90d supply, fill #1
  Filled 2021-12-04: qty 90, 90d supply, fill #2
  Filled 2022-03-28: qty 90, 90d supply, fill #3

## 2021-06-22 ENCOUNTER — Other Ambulatory Visit (HOSPITAL_COMMUNITY): Payer: Self-pay

## 2021-06-23 ENCOUNTER — Other Ambulatory Visit (HOSPITAL_COMMUNITY): Payer: Self-pay

## 2021-06-23 MED ORDER — GLUCOSE BLOOD VI STRP
ORAL_STRIP | 3 refills | Status: AC
  Filled 2021-06-23: qty 50, 50d supply, fill #0

## 2021-06-23 MED ORDER — FREESTYLE LITE W/DEVICE KIT
PACK | 0 refills | Status: AC
  Filled 2021-06-23: qty 1, 1d supply, fill #0

## 2021-06-23 MED ORDER — FREESTYLE LANCETS MISC
3 refills | Status: AC
  Filled 2021-06-23: qty 100, 90d supply, fill #0

## 2021-06-27 ENCOUNTER — Other Ambulatory Visit (HOSPITAL_COMMUNITY): Payer: Self-pay

## 2021-06-30 ENCOUNTER — Other Ambulatory Visit (HOSPITAL_COMMUNITY): Payer: Self-pay

## 2021-06-30 DIAGNOSIS — M1711 Unilateral primary osteoarthritis, right knee: Secondary | ICD-10-CM | POA: Diagnosis not present

## 2021-06-30 DIAGNOSIS — M1712 Unilateral primary osteoarthritis, left knee: Secondary | ICD-10-CM | POA: Diagnosis not present

## 2021-06-30 MED ORDER — TRAMADOL HCL 50 MG PO TABS
50.0000 mg | ORAL_TABLET | ORAL | 1 refills | Status: DC
  Filled 2021-06-30: qty 60, 8d supply, fill #0
  Filled 2021-07-19: qty 60, 8d supply, fill #1

## 2021-07-13 ENCOUNTER — Other Ambulatory Visit (HOSPITAL_COMMUNITY): Payer: Self-pay

## 2021-07-19 ENCOUNTER — Other Ambulatory Visit (HOSPITAL_COMMUNITY): Payer: Self-pay

## 2021-07-24 DIAGNOSIS — M25562 Pain in left knee: Secondary | ICD-10-CM | POA: Diagnosis not present

## 2021-08-14 ENCOUNTER — Other Ambulatory Visit (HOSPITAL_COMMUNITY): Payer: Self-pay

## 2021-08-18 ENCOUNTER — Other Ambulatory Visit (HOSPITAL_COMMUNITY): Payer: Self-pay

## 2021-08-18 MED ORDER — TRAMADOL HCL 50 MG PO TABS
50.0000 mg | ORAL_TABLET | ORAL | 0 refills | Status: DC
  Filled 2021-08-18: qty 30, 10d supply, fill #0

## 2021-08-28 ENCOUNTER — Other Ambulatory Visit (HOSPITAL_COMMUNITY): Payer: Self-pay

## 2021-08-29 ENCOUNTER — Other Ambulatory Visit (HOSPITAL_COMMUNITY): Payer: Self-pay

## 2021-08-29 MED ORDER — CYCLOBENZAPRINE HCL 10 MG PO TABS
10.0000 mg | ORAL_TABLET | Freq: Three times a day (TID) | ORAL | 1 refills | Status: DC
  Filled 2021-08-29: qty 90, 30d supply, fill #0
  Filled 2021-09-15: qty 90, 30d supply, fill #1

## 2021-09-02 ENCOUNTER — Telehealth: Payer: 59 | Admitting: Physician Assistant

## 2021-09-02 DIAGNOSIS — R300 Dysuria: Secondary | ICD-10-CM | POA: Diagnosis not present

## 2021-09-02 MED ORDER — CEPHALEXIN 500 MG PO CAPS: 500.0000 mg | ORAL_CAPSULE | Freq: Two times a day (BID) | ORAL | 0 refills | Status: AC

## 2021-09-02 NOTE — Progress Notes (Signed)
E-Visit for Urinary Problems  We are sorry that you are not feeling well.  Here is how we plan to help!  Based on what you shared with me it looks like you most likely have a simple urinary tract infection.  A UTI (Urinary Tract Infection) is a bacterial infection of the bladder.  Most cases of urinary tract infections are simple to treat but a key part of your care is to encourage you to drink plenty of fluids and watch your symptoms carefully.  I have prescribed Keflex 500 mg twice a day for 7 days.  Your symptoms should gradually improve. Call us if the burning in your urine worsens, you develop worsening fever, back pain or pelvic pain or if your symptoms do not resolve after completing the antibiotic.  Urinary tract infections can be prevented by drinking plenty of water to keep your body hydrated.  Also be sure when you wipe, wipe from front to back and don't hold it in!  If possible, empty your bladder every 4 hours.  HOME CARE Drink plenty of fluids Compete the full course of the antibiotics even if the symptoms resolve Remember, when you need to go.go. Holding in your urine can increase the likelihood of getting a UTI! GET HELP RIGHT AWAY IF: You cannot urinate You get a high fever Worsening back pain occurs You see blood in your urine You feel sick to your stomach or throw up You feel like you are going to pass out  MAKE SURE YOU  Understand these instructions. Will watch your condition. Will get help right away if you are not doing well or get worse.   Thank you for choosing an e-visit.  Your e-visit answers were reviewed by a board certified advanced clinical practitioner to complete your personal care plan. Depending upon the condition, your plan could have included both over the counter or prescription medications.  Please review your pharmacy choice. Make sure the pharmacy is open so you can pick up prescription now. If there is a problem, you may contact your  provider through [employer] and have the prescription routed to another pharmacy.  Your safety is important to [place]. If you have drug allergies check your prescription carefully.   For the next 24 hours you can use MyChart to ask questions about today's visit, request a non-urgent call back, or ask for a work or school excuse. You will get an email in the next two days asking about your experience. I hope that your e-visit has been valuable and will speed your recovery.  This appointment required 5-10 minutes of patient care (this includes precharting, chart review, review of results, face-to-face care, etc.).  Inda Coke PA-C

## 2021-09-15 ENCOUNTER — Other Ambulatory Visit (HOSPITAL_COMMUNITY): Payer: Self-pay | Admitting: Orthopedic Surgery

## 2021-09-15 ENCOUNTER — Other Ambulatory Visit (HOSPITAL_COMMUNITY): Payer: Self-pay

## 2021-09-15 MED ORDER — TRAMADOL HCL 50 MG PO TABS
50.0000 mg | ORAL_TABLET | ORAL | 0 refills | Status: DC | PRN
  Filled 2021-09-15: qty 30, 10d supply, fill #0

## 2021-09-18 ENCOUNTER — Other Ambulatory Visit (HOSPITAL_COMMUNITY): Payer: Self-pay | Admitting: Orthopedic Surgery

## 2021-09-18 ENCOUNTER — Other Ambulatory Visit (HOSPITAL_COMMUNITY): Payer: Self-pay

## 2021-09-22 ENCOUNTER — Other Ambulatory Visit (HOSPITAL_COMMUNITY): Payer: Self-pay

## 2021-09-22 ENCOUNTER — Other Ambulatory Visit (HOSPITAL_COMMUNITY): Payer: Self-pay | Admitting: Orthopedic Surgery

## 2021-09-25 ENCOUNTER — Other Ambulatory Visit (HOSPITAL_COMMUNITY): Payer: Self-pay

## 2021-09-26 ENCOUNTER — Other Ambulatory Visit (HOSPITAL_COMMUNITY): Payer: Self-pay

## 2021-10-04 ENCOUNTER — Other Ambulatory Visit (HOSPITAL_COMMUNITY): Payer: Self-pay

## 2021-10-05 ENCOUNTER — Other Ambulatory Visit (HOSPITAL_COMMUNITY): Payer: Self-pay

## 2021-10-06 ENCOUNTER — Other Ambulatory Visit (HOSPITAL_COMMUNITY): Payer: Self-pay

## 2021-10-06 MED ORDER — MELOXICAM 15 MG PO TABS
15.0000 mg | ORAL_TABLET | Freq: Every day | ORAL | 3 refills | Status: DC
  Filled 2021-10-06: qty 90, 90d supply, fill #0
  Filled 2021-12-31: qty 90, 90d supply, fill #1
  Filled 2022-03-28: qty 90, 90d supply, fill #2
  Filled 2022-06-26: qty 90, 90d supply, fill #3

## 2021-10-06 MED ORDER — TRAMADOL HCL 50 MG PO TABS
50.0000 mg | ORAL_TABLET | ORAL | 0 refills | Status: DC | PRN
  Filled 2021-10-06: qty 30, 10d supply, fill #0

## 2021-10-24 DIAGNOSIS — Z0289 Encounter for other administrative examinations: Secondary | ICD-10-CM

## 2021-10-31 ENCOUNTER — Encounter (INDEPENDENT_AMBULATORY_CARE_PROVIDER_SITE_OTHER): Payer: Self-pay | Admitting: Family Medicine

## 2021-10-31 ENCOUNTER — Ambulatory Visit (INDEPENDENT_AMBULATORY_CARE_PROVIDER_SITE_OTHER): Payer: 59 | Admitting: Family Medicine

## 2021-10-31 VITALS — BP 111/76 | HR 88 | Temp 97.8°F | Ht 68.0 in | Wt 296.0 lb

## 2021-10-31 DIAGNOSIS — E1165 Type 2 diabetes mellitus with hyperglycemia: Secondary | ICD-10-CM | POA: Diagnosis not present

## 2021-10-31 DIAGNOSIS — R232 Flushing: Secondary | ICD-10-CM | POA: Diagnosis not present

## 2021-10-31 DIAGNOSIS — Z1331 Encounter for screening for depression: Secondary | ICD-10-CM

## 2021-10-31 DIAGNOSIS — E1169 Type 2 diabetes mellitus with other specified complication: Secondary | ICD-10-CM

## 2021-10-31 DIAGNOSIS — R5383 Other fatigue: Secondary | ICD-10-CM | POA: Diagnosis not present

## 2021-10-31 DIAGNOSIS — E559 Vitamin D deficiency, unspecified: Secondary | ICD-10-CM | POA: Diagnosis not present

## 2021-10-31 DIAGNOSIS — Z6841 Body Mass Index (BMI) 40.0 and over, adult: Secondary | ICD-10-CM

## 2021-10-31 DIAGNOSIS — H40013 Open angle with borderline findings, low risk, bilateral: Secondary | ICD-10-CM | POA: Diagnosis not present

## 2021-10-31 DIAGNOSIS — H25813 Combined forms of age-related cataract, bilateral: Secondary | ICD-10-CM | POA: Diagnosis not present

## 2021-10-31 DIAGNOSIS — R0602 Shortness of breath: Secondary | ICD-10-CM

## 2021-10-31 DIAGNOSIS — H5213 Myopia, bilateral: Secondary | ICD-10-CM | POA: Diagnosis not present

## 2021-10-31 DIAGNOSIS — E1136 Type 2 diabetes mellitus with diabetic cataract: Secondary | ICD-10-CM | POA: Diagnosis not present

## 2021-10-31 DIAGNOSIS — E785 Hyperlipidemia, unspecified: Secondary | ICD-10-CM | POA: Diagnosis not present

## 2021-10-31 DIAGNOSIS — K760 Fatty (change of) liver, not elsewhere classified: Secondary | ICD-10-CM | POA: Diagnosis not present

## 2021-11-01 LAB — CBC WITH DIFFERENTIAL/PLATELET
Basophils Absolute: 0 10*3/uL (ref 0.0–0.2)
Basos: 0 %
EOS (ABSOLUTE): 0.1 10*3/uL (ref 0.0–0.4)
Eos: 2 %
Hematocrit: 39.6 % (ref 34.0–46.6)
Hemoglobin: 13.3 g/dL (ref 11.1–15.9)
Immature Grans (Abs): 0 10*3/uL (ref 0.0–0.1)
Immature Granulocytes: 0 %
Lymphocytes Absolute: 1.9 10*3/uL (ref 0.7–3.1)
Lymphs: 34 %
MCH: 29.6 pg (ref 26.6–33.0)
MCHC: 33.6 g/dL (ref 31.5–35.7)
MCV: 88 fL (ref 79–97)
Monocytes Absolute: 0.3 10*3/uL (ref 0.1–0.9)
Monocytes: 6 %
Neutrophils Absolute: 3.2 10*3/uL (ref 1.4–7.0)
Neutrophils: 58 %
Platelets: 230 10*3/uL (ref 150–450)
RBC: 4.5 x10E6/uL (ref 3.77–5.28)
RDW: 13.9 % (ref 11.7–15.4)
WBC: 5.5 10*3/uL (ref 3.4–10.8)

## 2021-11-01 LAB — VITAMIN D 25 HYDROXY (VIT D DEFICIENCY, FRACTURES): Vit D, 25-Hydroxy: 34.8 ng/mL (ref 30.0–100.0)

## 2021-11-01 LAB — LIPID PANEL WITH LDL/HDL RATIO
Cholesterol, Total: 178 mg/dL (ref 100–199)
HDL: 64 mg/dL (ref >39)
LDL Chol Calc (NIH): 77 mg/dL (ref 0–99)
LDL/HDL Ratio: 1.2 ratio (ref 0.0–3.2)
Triglycerides: 226 mg/dL — ABNORMAL HIGH (ref 0–149)
VLDL Cholesterol Cal: 37 mg/dL (ref 5–40)

## 2021-11-01 LAB — HEMOGLOBIN A1C
Est. average glucose Bld gHb Est-mCnc: 157 mg/dL
Hgb A1c MFr Bld: 7.1 % — ABNORMAL HIGH (ref 4.8–5.6)

## 2021-11-01 LAB — INSULIN, RANDOM: INSULIN: 39 u[IU]/mL — ABNORMAL HIGH (ref 2.6–24.9)

## 2021-11-01 LAB — VITAMIN B12: Vitamin B-12: 233 pg/mL (ref 232–1245)

## 2021-11-01 LAB — TSH: TSH: 1.2 u[IU]/mL (ref 0.450–4.500)

## 2021-11-01 LAB — T3: T3, Total: 101 ng/dL (ref 71–180)

## 2021-11-01 LAB — T4, FREE: Free T4: 1.22 ng/dL (ref 0.82–1.77)

## 2021-11-01 LAB — FOLATE: Folate: 10.8 ng/mL (ref >3.0)

## 2021-11-07 ENCOUNTER — Other Ambulatory Visit (HOSPITAL_COMMUNITY): Payer: Self-pay

## 2021-11-08 ENCOUNTER — Encounter (INDEPENDENT_AMBULATORY_CARE_PROVIDER_SITE_OTHER): Payer: Self-pay

## 2021-11-13 NOTE — Progress Notes (Signed)
Chief Complaint:   Barbara Trevino (MR# [id number]) is a 61 y.o. female who presents for evaluation and treatment of Barbara and related comorbidities. Current BMI is Body mass index is 45.01 kg/m. Elke has been struggling with her weight for many years and has been unsuccessful in either losing weight, maintaining weight loss, or reaching her healthy weight goal.  [place] is a returning patient. She eats out 3-4 times a week. Breakfast is a protein shake (Equate choc) or a protein bar and banana or 2 boiled eggs. Sometimes she skips lunch or sometimes just a small bag of pretzels. 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  Ankita is currently in the action stage of change and ready to dedicate time achieving and maintaining a healthier weight. Zasha is interested in becoming our patient and working on intensive lifestyle modifications including (but not limited to) diet and exercise for weight loss.  Tria's habits were reviewed today and are as follows: her desired weight loss is 125 lbs, she started gaining weight in the last 5 years, her heaviest weight ever was 300 lbs pounds, and she snacks frequently in the evenings.  Depression Screen Vedanshi's Food and Mood (modified PHQ-9) score was 9.     10/31/2021    8:41 AM  Depression screen PHQ 2/9  Decreased Interest 2  Down, Depressed, Hopeless 1  PHQ - 2 Score 3  Altered sleeping 0  Tired, decreased energy 3  Change in appetite 1  Feeling bad or failure about yourself  1  Trouble concentrating 0  Moving slowly or fidgety/restless 1  Suicidal thoughts 0  PHQ-9 Score 9  Difficult doing work/chores Not difficult at all   Subjective:   1. Other fatigue Nan denies daytime somnolence and denies waking up still tired. Patient has a history of symptoms of daytime fatigue. Kimbely generally gets 7 hours of sleep per night, and states that she has generally restful sleep. Snoring is not present. Apneic  episodes are not present. Epworth Sleepiness Score is 5.   2. SOBOE (shortness of breath on exertion) Dariyah notes increasing shortness of breath with exercising and seems to be worsening over time with weight gain. She notes getting out of breath sooner with activity than she used to. This has not gotten worse recently. Rosiland denies shortness of breath at rest or orthopnea.  3. Type 2 diabetes mellitus with hyperglycemia, without long-term current use of insulin ([place]) Dahiana was diagnosed in Nov 2022, with a A1c of 6.9. She is not currently on medication. She intermittently checks her blood sugars.  4. Hyperlipidemia associated with type 2 diabetes mellitus ([place]) Corinthia's last LDL was at goal, Nov 2022. She is currently taking a statin, Crestor 20 mg daily.  5. Vitamin D deficiency Gitty is currently taking a over the counter Vit D. She notes fatigue. Her last Vit D level of 33.7.  6. NAFLD (nonalcoholic fatty liver disease) Chessica's last LFT's was within normal limits.   7. Hot flashes Gwenyth is currently taking Amitriptyline and Effexor. Good control on these medications.  Assessment/Plan:   1. Other fatigue Lydiana does feel that her weight is causing her energy to be lower than it should be. Fatigue may be related to Barbara, depression or many other causes. Labs will be ordered, and in the meanwhile, Jubilee will focus on self care including making healthy food choices, increasing physical activity and focusing on stress reduction. We will obtain labs today.  - Vitamin  B12 - Folate - TSH - T4, free - T3  2. SOBOE (shortness of breath on exertion) Pranathi does feel that she gets out of breath more easily that she used to when she exercises. Mariah's shortness of breath appears to be Barbara related and exercise induced. She has agreed to work on weight loss and gradually increase exercise to treat her exercise induced shortness of breath. Will continue to monitor closely. We will obtain labs  today.  - CBC with Differential/Platelet - EKG 12-Lead  3. Type 2 diabetes mellitus with hyperglycemia, without long-term current use of insulin (HCC) We will obtain labs today.  - Hemoglobin A1c - Insulin, random - COMPLETE METABOLIC PANEL WITH GFR  4. Hyperlipidemia associated with type 2 diabetes mellitus ([place]) We will obtain labs today.  - Lipid Panel With LDL/HDL Ratio  5. Vitamin D deficiency We will obtain labs today.  - VITAMIN D 25 Hydroxy (Vit-D Deficiency, Fractures)  6. NAFLD (nonalcoholic fatty liver disease) We will obtain labs today.  7. Hot flashes We will follow up on Allyse's symptoms at next appointment.  8. Depression screening Merriam had a positive depression screening. Depression is commonly associated with Barbara and often results in emotional eating behaviors. We will monitor this closely and work on CBT to help improve the non-hunger eating patterns. Referral to Psychology may be required if no improvement is seen as she continues in our clinic.  9. 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 I recommend Noreene begin the structured treatment plan as follows:  She has agreed to the Category 4 Plan.  Exercise goals: No exercise has been prescribed at this time.   Behavioral modification strategies: increasing lean protein intake, no skipping meals, meal planning and cooking strategies, keeping healthy foods in the home, and planning for success.  She was informed of the importance of frequent follow-up visits to maximize her success with intensive lifestyle modifications for her multiple health conditions. She was informed we would discuss her lab results at her next visit unless there is a critical issue that needs to be addressed sooner. Dua agreed to keep her next  visit at the agreed upon time to discuss these results.  Objective:   Blood pressure 111/76, pulse 88, temperature 97.8 F (36.6 C), height '5\' 8"'$  (1.727 m), weight 296 lb (134.3 kg), SpO2 96 %. Body mass index is 45.01 kg/m.  EKG: Normal sinus rhythm  Indirect Calorimeter completed today shows a VO2 of 322 and a REE of 2218.  Her calculated basal metabolic rate is 8185 thus her basal metabolic rate is worse than expected.  General: Cooperative, alert, well developed, in no acute distress. HEENT: Conjunctivae and lids unremarkable. Cardiovascular: Regular rhythm.  Lungs: Normal work of breathing. Neurologic: No focal deficits.   Lab Results  Component Value Date   CREATININE 1.07 (H) 06/18/2018   BUN 12 06/18/2018   NA 139 06/18/2018   K 4.6 06/18/2018   CL 97 06/18/2018   CO2 26 06/18/2018   Lab Results  Component Value Date   ALT 40 (H) 06/18/2018   AST 34 06/18/2018   ALKPHOS 94 06/18/2018   BILITOT 0.3 06/18/2018   Lab Results  Component Value Date   HGBA1C 7.1 (H) 10/31/2021   HGBA1C 6.3 (H) 06/18/2018   Lab Results  Component Value Date   INSULIN 39.0 (H) 10/31/2021   INSULIN 32.2 (H) 06/18/2018   Lab Results  Component Value Date   TSH 1.200 10/31/2021   Lab Results  Component Value Date   CHOL 178 10/31/2021   HDL 64 10/31/2021   LDLCALC 77 10/31/2021   TRIG 226 (H) 10/31/2021   Lab Results  Component Value Date   WBC 5.5 10/31/2021   HGB 13.3 10/31/2021   HCT 39.6 10/31/2021   MCV 88 10/31/2021   PLT 230 10/31/2021   No results found for: "IRON", "TIBC", "FERRITIN"  Attestation Statements:   Reviewed by clinician on day of visit: allergies, medications, problem list, medical history, surgical history, family history, social history, and previous encounter notes.  Time spent on visit including pre-visit chart review and post-visit charting and care was 46 minutes.   I, Elnora Morrison, RMA am acting as transcriptionist for Coralie Common,  MD.  This is the patient's first visit at [facility]. The patient's NEW PATIENT PACKET was reviewed at length. Included in the packet: current and past health history, medications, allergies, ROS, gynecologic history (women only), surgical history, family history, social history, weight history, weight loss surgery history (for those that have had weight loss surgery), nutritional evaluation, mood and food questionnaire, PHQ9, Epworth questionnaire, sleep habits questionnaire, patient life and health improvement goals questionnaire. These will all be scanned into the patient's chart under media.   During the visit, I independently reviewed the patient's EKG, bioimpedance scale results, and indirect calorimeter results. I used this information to tailor a meal plan for the patient that will help her to lose weight and will improve her Barbara-related conditions going forward. I performed a medically necessary appropriate examination and/or evaluation. I discussed the assessment and treatment plan with the patient. The patient was provided an opportunity to ask questions and all were answered. The patient agreed with the plan and demonstrated an understanding of the instructions. Labs were ordered at this visit and will be reviewed at the next visit unless more critical results need to be addressed immediately. Clinical information was updated and documented in the EMR.   Time spent on visit including pre-visit chart review and post-visit care was 46 minutes.   I have reviewed the above documentation for accuracy and completeness, and I agree with the above. - Coralie Common, MD

## 2021-11-14 ENCOUNTER — Other Ambulatory Visit (HOSPITAL_COMMUNITY): Payer: Self-pay

## 2021-11-14 ENCOUNTER — Ambulatory Visit (INDEPENDENT_AMBULATORY_CARE_PROVIDER_SITE_OTHER): Payer: 59 | Admitting: Family Medicine

## 2021-11-14 ENCOUNTER — Encounter (INDEPENDENT_AMBULATORY_CARE_PROVIDER_SITE_OTHER): Payer: Self-pay | Admitting: Family Medicine

## 2021-11-14 VITALS — BP 123/82 | HR 91 | Temp 97.5°F | Ht 68.0 in | Wt 299.0 lb

## 2021-11-14 DIAGNOSIS — E559 Vitamin D deficiency, unspecified: Secondary | ICD-10-CM | POA: Diagnosis not present

## 2021-11-14 DIAGNOSIS — E1169 Type 2 diabetes mellitus with other specified complication: Secondary | ICD-10-CM | POA: Diagnosis not present

## 2021-11-14 DIAGNOSIS — E669 Obesity, unspecified: Secondary | ICD-10-CM | POA: Diagnosis not present

## 2021-11-14 DIAGNOSIS — Z7985 Long-term (current) use of injectable non-insulin antidiabetic drugs: Secondary | ICD-10-CM | POA: Diagnosis not present

## 2021-11-14 DIAGNOSIS — E785 Hyperlipidemia, unspecified: Secondary | ICD-10-CM

## 2021-11-14 DIAGNOSIS — Z6841 Body Mass Index (BMI) 40.0 and over, adult: Secondary | ICD-10-CM | POA: Diagnosis not present

## 2021-11-14 DIAGNOSIS — E1165 Type 2 diabetes mellitus with hyperglycemia: Secondary | ICD-10-CM

## 2021-11-14 MED ORDER — TIRZEPATIDE 2.5 MG/0.5ML ~~LOC~~ SOAJ
2.5000 mg | SUBCUTANEOUS | 0 refills | Status: DC
  Filled 2021-11-14: qty 2, 28d supply, fill #0

## 2021-11-14 MED ORDER — VITAMIN D (ERGOCALCIFEROL) 1.25 MG (50000 UNIT) PO CAPS
50000.0000 [IU] | ORAL_CAPSULE | ORAL | 0 refills | Status: DC
  Filled 2021-11-14: qty 4, 28d supply, fill #0

## 2021-11-21 NOTE — Progress Notes (Signed)
Chief Complaint:   OBESITY Barbara Trevino is here to discuss her progress with her obesity treatment plan along with follow-up of her obesity related diagnoses. Barbara Trevino is on the Category 4 Plan and states she is following her eating plan approximately 50% of the time. Barbara Trevino states she is walking 4,000 steps 5 times per week.  Today's visit was #: 2 Starting weight: 296 lbs Starting date: 10/31/2021 Today's weight: 299 lbs Today's date: 11/14/2021 Total lbs lost to date: 0 Total lbs lost since last in-office visit: 0  Interim History: Barbara Trevino celebrated her 70th birthday since her last appointment. Did not get milk in as frequently. She did do sandwiches option for lunch. Was not eating bread at breakfast either. She has tried frozen meals a few times. Closer to 8 oz at night. Snacks were scoops, fruit and maybe sweet tea.  Subjective:   1. Hyperlipidemia associated with type 2 diabetes mellitus (Barbara Trevino) Barbara Trevino is currently taking Crestor 20 mg daily. Trigly at 226, HDL at 64, and LDL at 77 without myalgias.  2. Vitamin D deficiency Barbara Trevino is not taking Vit D anymore. Her Vit D level of 34.8. She notes fatigue.  3. Type 2 diabetes mellitus with hyperglycemia, without long-term current use of insulin (HCC) Barbara Trevino's A1c was 7.1, inslin was 39.0. She is not checking blood sugar at home or taking any medication.  Assessment/Plan:   1. Hyperlipidemia associated with type 2 diabetes mellitus ([place]) Barbara Trevino will continue taking Crestor without changes in dose.   2. Vitamin D deficiency RESTART Vit D 50,000 IU once a week for 1 month with 0 refills.  -Restar/refill Vitamin D, Ergocalciferol, (DRISDOL) 1.25 MG (50000 UNIT) CAPS capsule; Take 1 capsule (50,000 Units total) by mouth every 7 (seven) days.  Dispense: 4 capsule; Refill: 0  3. Type 2 diabetes mellitus with hyperglycemia, without long-term current use of insulin (HCC) Start Mounjaro 2.5 mg SubQ once weekly for 1 month with 0 refills.  -Start  tirzepatide [facility]) 2.5 MG/0.5ML Pen; Inject 2.5 mg into the skin once a week.  Dispense: 2 mL; Refill: 0  4. Obesity with current BMI of 45.6 Barbara Trevino is currently in the action stage of change. As such, her goal is to continue with weight loss efforts. She has agreed to the Category 4 Plan.   Exercise goals: No exercise has been prescribed at this time.  Behavioral modification strategies: increasing lean protein intake, meal planning and cooking strategies, keeping healthy foods in the home, and planning for success.  Barbara Trevino has agreed to follow-up with our clinic in 2 weeks. She was informed of the importance of frequent follow-up visits to maximize her success with intensive lifestyle modifications for her multiple health conditions.   Objective:   Blood pressure 123/82, pulse 91, temperature (!) 97.5 F (36.4 C), height '5\' 8"'$  (1.727 m), weight 299 lb (135.6 kg), SpO2 97 %. Body mass index is 45.46 kg/m.  General: Cooperative, alert, well developed, in no acute distress. HEENT: Conjunctivae and lids unremarkable. Cardiovascular: Regular rhythm.  Lungs: Normal work of breathing. Neurologic: No focal deficits.   Lab Results  Component Value Date   CREATININE 1.07 (H) 06/18/2018   BUN 12 06/18/2018   NA 139 06/18/2018   K 4.6 06/18/2018   CL 97 06/18/2018   CO2 26 06/18/2018   Lab Results  Component Value Date   ALT 40 (H) 06/18/2018   AST 34 06/18/2018   ALKPHOS 94 06/18/2018   BILITOT 0.3 06/18/2018   Lab Results  Component Value Date   HGBA1C 7.1 (H) 10/31/2021   HGBA1C 6.3 (H) 06/18/2018   Lab Results  Component Value Date   INSULIN 39.0 (H) 10/31/2021   INSULIN 32.2 (H) 06/18/2018   Lab Results  Component Value Date   TSH 1.200 10/31/2021   Lab Results  Component Value Date   CHOL 178 10/31/2021   HDL 64 10/31/2021   LDLCALC 77 10/31/2021   TRIG 226 (H) 10/31/2021   Lab Results  Component Value Date   VD25OH 34.8 10/31/2021   VD25OH 27.4 (L)  06/18/2018   VD25OH 59 01/04/2011   Lab Results  Component Value Date   WBC 5.5 10/31/2021   HGB 13.3 10/31/2021   HCT 39.6 10/31/2021   MCV 88 10/31/2021   PLT 230 10/31/2021   No results found for: "IRON", "TIBC", "FERRITIN"  Attestation Statements:   Reviewed by clinician on day of visit: allergies, medications, problem list, medical history, surgical history, family history, social history, and previous encounter notes.  Time spent on visit including pre-visit chart review and post-visit care and charting was 50 minutes.   I, Elnora Morrison, RMA am acting as transcriptionist for Coralie Common, MD. I have reviewed the above documentation for accuracy and completeness, and I agree with the above. - Coralie Common, MD

## 2021-11-24 ENCOUNTER — Other Ambulatory Visit (HOSPITAL_COMMUNITY): Payer: Self-pay

## 2021-11-27 ENCOUNTER — Other Ambulatory Visit (HOSPITAL_COMMUNITY): Payer: Self-pay

## 2021-11-27 ENCOUNTER — Encounter (INDEPENDENT_AMBULATORY_CARE_PROVIDER_SITE_OTHER): Payer: Self-pay | Admitting: Family Medicine

## 2021-11-27 ENCOUNTER — Ambulatory Visit (INDEPENDENT_AMBULATORY_CARE_PROVIDER_SITE_OTHER): Payer: 59 | Admitting: Family Medicine

## 2021-11-27 VITALS — BP 143/83 | HR 89 | Temp 97.6°F | Ht 68.0 in | Wt 295.0 lb

## 2021-11-27 DIAGNOSIS — E559 Vitamin D deficiency, unspecified: Secondary | ICD-10-CM

## 2021-11-27 DIAGNOSIS — Z7985 Long-term (current) use of injectable non-insulin antidiabetic drugs: Secondary | ICD-10-CM

## 2021-11-27 DIAGNOSIS — E669 Obesity, unspecified: Secondary | ICD-10-CM

## 2021-11-27 DIAGNOSIS — Z6841 Body Mass Index (BMI) 40.0 and over, adult: Secondary | ICD-10-CM | POA: Diagnosis not present

## 2021-11-27 DIAGNOSIS — E1165 Type 2 diabetes mellitus with hyperglycemia: Secondary | ICD-10-CM

## 2021-11-27 MED ORDER — VITAMIN D (ERGOCALCIFEROL) 1.25 MG (50000 UNIT) PO CAPS
50000.0000 [IU] | ORAL_CAPSULE | ORAL | 0 refills | Status: DC
  Filled 2021-11-27 – 2021-12-06 (×2): qty 4, 28d supply, fill #0

## 2021-11-27 MED ORDER — TIRZEPATIDE 2.5 MG/0.5ML ~~LOC~~ SOAJ
2.5000 mg | SUBCUTANEOUS | 0 refills | Status: DC
  Filled 2021-11-27 – 2021-12-06 (×2): qty 2, 28d supply, fill #0

## 2021-12-02 NOTE — Progress Notes (Signed)
Chief Complaint:   OBESITY Barbara Trevino is here to discuss her progress with her obesity treatment plan along with follow-up of her obesity related diagnoses. Barbara Trevino is on the Category 4 Plan and states she is following her eating plan approximately 50% of the time. Barbara Trevino states she is doing yard work for 2 hours 2 times per week.  Today's visit was #: 3 Starting weight: 296 lbs Starting date: 10/31/2021 Today's weight: 295 lbs Today's date: 11/27/2021 Total lbs lost to date: 1 Total lbs lost since last in-office visit: 4  Interim History: Since her last appointment, Barbara Trevino has been mostly working. She did slip in her shower and fell on her knee. Barbara Trevino is trying to stick to Category 4 as much as she can, but is struggling to get in all of her food. She is able to get in her snack calories. Barbara Trevino does not have plans for the next few weeks involving travel.  Subjective:   1. Vitamin D deficiency She is currently taking prescription vitamin D 50,000 IU each week. Her last vitamin D level was 34.8. She notes fatigue and denies nausea, vomiting, or muscle weakness.  2. Type 2 diabetes mellitus with hyperglycemia, without long-term current use of insulin (Barbara Trevino) Barbara Trevino is having occasional nausea while on Mounjaro, if she goes too long without eating. Her last A1c was 7.1 and insulin was 39.0.  Assessment/Plan:   1. Vitamin D deficiency Low Vitamin D level contributes to fatigue and are associated with obesity, breast, and colon cancer. She agrees to continue to take prescription Vitamin D '@50'$ ,000 IU every week and will follow-up for routine testing of Vitamin D, at least 2-3 times per year to avoid over-replacement.  - Vitamin D, Ergocalciferol, (DRISDOL) 1.25 MG (50000 UNIT) CAPS capsule; Take 1 capsule (50,000 Units total) by mouth every 7 (seven) days.  Dispense: 4 capsule; Refill: 0  2. Type 2 diabetes mellitus with hyperglycemia, without long-term current use of insulin (HCC) Good blood sugar  control is important to decrease the likelihood of diabetic complications such as nephropathy, neuropathy, limb loss, blindness, coronary artery disease, and death. Intensive lifestyle modification including diet, exercise, and weight loss are the first line of treatment for diabetes.   - tirzepatide [facility]) 2.5 MG/0.5ML Pen; Inject 2.5 mg into the skin once a week.  Dispense: 2 mL; Refill: 0  3. Obesity with current BMI of 44.9 Barbara Trevino is currently in the action stage of change. As such, her goal is to continue with weight loss efforts. She has agreed to the Category 4 Plan.   Exercise goals: No exercise has been prescribed at this time.  Behavioral modification strategies: increasing lean protein intake, no skipping meals, meal planning and cooking strategies, and keeping healthy foods in the home.  Barbara Trevino has agreed to follow-up with our clinic in 2 to 3 weeks. She was informed of the importance of frequent follow-up visits to maximize her success with intensive lifestyle modifications for her multiple health conditions.   Objective:   Blood pressure (!) 143/83, pulse 89, temperature 97.6 F (36.4 C), height '5\' 8"'$  (1.727 m), weight 295 lb (133.8 kg), SpO2 97 %. Body mass index is 44.85 kg/m.  General: Cooperative, alert, well developed, in no acute distress. HEENT: Conjunctivae and lids unremarkable. Cardiovascular: Regular rhythm.  Lungs: Normal work of breathing. Neurologic: No focal deficits.   Lab Results  Component Value Date   CREATININE 1.07 (H) 06/18/2018   BUN 12 06/18/2018   NA 139 06/18/2018  K 4.6 06/18/2018   CL 97 06/18/2018   CO2 26 06/18/2018   Lab Results  Component Value Date   ALT 40 (H) 06/18/2018   AST 34 06/18/2018   ALKPHOS 94 06/18/2018   BILITOT 0.3 06/18/2018   Lab Results  Component Value Date   HGBA1C 7.1 (H) 10/31/2021   HGBA1C 6.3 (H) 06/18/2018   Lab Results  Component Value Date   INSULIN 39.0 (H) 10/31/2021   INSULIN 32.2 (H)  06/18/2018   Lab Results  Component Value Date   TSH 1.200 10/31/2021   Lab Results  Component Value Date   CHOL 178 10/31/2021   HDL 64 10/31/2021   LDLCALC 77 10/31/2021   TRIG 226 (H) 10/31/2021   Lab Results  Component Value Date   VD25OH 34.8 10/31/2021   VD25OH 27.4 (L) 06/18/2018   VD25OH 59 01/04/2011   Lab Results  Component Value Date   WBC 5.5 10/31/2021   HGB 13.3 10/31/2021   HCT 39.6 10/31/2021   MCV 88 10/31/2021   PLT 230 10/31/2021   No results found for: "IRON", "TIBC", "FERRITIN"  Attestation Statements:   Reviewed by clinician on day of visit: allergies, medications, problem list, medical history, surgical history, family history, social history, and previous encounter notes.  IMarcille Blanco, CMA, am acting as transcriptionist for Coralie Common, MD  I have reviewed the above documentation for accuracy and completeness, and I agree with the above. - Coralie Common, MD

## 2021-12-04 ENCOUNTER — Other Ambulatory Visit (HOSPITAL_COMMUNITY): Payer: Self-pay

## 2021-12-05 ENCOUNTER — Other Ambulatory Visit (HOSPITAL_COMMUNITY): Payer: Self-pay

## 2021-12-05 ENCOUNTER — Telehealth (INDEPENDENT_AMBULATORY_CARE_PROVIDER_SITE_OTHER): Payer: Self-pay

## 2021-12-05 MED ORDER — CYCLOBENZAPRINE HCL 10 MG PO TABS
10.0000 mg | ORAL_TABLET | Freq: Three times a day (TID) | ORAL | 1 refills | Status: DC
  Filled 2021-12-05: qty 90, 30d supply, fill #0
  Filled 2022-01-15: qty 90, 30d supply, fill #1

## 2021-12-05 NOTE — Telephone Encounter (Signed)
Patient called in states her Vitamin D, Ergocalciferol, (DRISDOL) 1.25 MG (50000 UNIT) CAPS capsule and tirzepatide [facility]) 2.5 MG/0.5ML Pen wasn't sent in when she was in on August 28th with Dr. Jeani Sow. Patient would like to see if it could be resent to :  [place] Phone:  [phone]  Fax:  [phone]

## 2021-12-06 ENCOUNTER — Other Ambulatory Visit (HOSPITAL_COMMUNITY): Payer: Self-pay

## 2021-12-06 NOTE — Telephone Encounter (Signed)
Spoke w/ pharmacy, and pt- Rx's received by Karie Schwalbe can be filled on 12/07/21-CS

## 2021-12-07 ENCOUNTER — Other Ambulatory Visit (HOSPITAL_COMMUNITY): Payer: Self-pay

## 2021-12-20 ENCOUNTER — Ambulatory Visit (INDEPENDENT_AMBULATORY_CARE_PROVIDER_SITE_OTHER): Payer: 59 | Admitting: Family Medicine

## 2021-12-20 ENCOUNTER — Other Ambulatory Visit (HOSPITAL_COMMUNITY): Payer: Self-pay

## 2021-12-20 ENCOUNTER — Other Ambulatory Visit: Payer: Self-pay | Admitting: General Surgery

## 2021-12-20 ENCOUNTER — Encounter (INDEPENDENT_AMBULATORY_CARE_PROVIDER_SITE_OTHER): Payer: Self-pay | Admitting: Family Medicine

## 2021-12-20 VITALS — BP 127/81 | HR 88 | Temp 97.7°F | Ht 68.0 in | Wt 294.0 lb

## 2021-12-20 DIAGNOSIS — Z6841 Body Mass Index (BMI) 40.0 and over, adult: Secondary | ICD-10-CM

## 2021-12-20 DIAGNOSIS — E669 Obesity, unspecified: Secondary | ICD-10-CM | POA: Diagnosis not present

## 2021-12-20 DIAGNOSIS — E559 Vitamin D deficiency, unspecified: Secondary | ICD-10-CM | POA: Diagnosis not present

## 2021-12-20 DIAGNOSIS — Z7985 Long-term (current) use of injectable non-insulin antidiabetic drugs: Secondary | ICD-10-CM | POA: Diagnosis not present

## 2021-12-20 DIAGNOSIS — E1165 Type 2 diabetes mellitus with hyperglycemia: Secondary | ICD-10-CM

## 2021-12-20 MED ORDER — VITAMIN D (ERGOCALCIFEROL) 1.25 MG (50000 UNIT) PO CAPS
50000.0000 [IU] | ORAL_CAPSULE | ORAL | 0 refills | Status: DC
  Filled 2021-12-20: qty 4, 28d supply, fill #0

## 2021-12-20 MED ORDER — TIRZEPATIDE 2.5 MG/0.5ML ~~LOC~~ SOAJ
2.5000 mg | SUBCUTANEOUS | 0 refills | Status: DC
  Filled 2021-12-20 – 2022-01-01 (×2): qty 2, 28d supply, fill #0

## 2021-12-21 NOTE — Progress Notes (Signed)
Chief Complaint:   OBESITY Barbara Trevino is here to discuss her progress with her obesity treatment plan along with follow-up of her obesity related diagnoses. Barbara Trevino is on the Category 4 Plan and states she is following her eating plan approximately 70% of the time. Barbara Trevino states she is exercising 0 minutes 0 times per week.  Today's visit was #: 4 Starting weight: 296 lbs Starting date: 10/31/2021 Today's weight: 294 lbs Today's date: 12/20/2021 Total lbs lost to date: 2 lbs Total lbs lost since last in-office visit: 1  Interim History: Barbara Trevino went to [place] with her family since last appointment. Did have some significant GI issues over the weekend. She did note she had a less then fresh salad prior to GI issues starting. She is back at work now.  Subjective:   1. Type 2 diabetes mellitus with hyperglycemia, without long-term current use of insulin (HCC) Barbara Trevino is on Mounjaro without GI side effects (recent GI side effects likely due to virus or food). Last A1c 7.1, Insulin 39.0.  2. Vitamin D deficiency Barbara Trevino is on Rx Vit D. Denies any nausea, vomiting or muscle weakness. She notes fatigue.  Assessment/Plan:   1. Type 2 diabetes mellitus with hyperglycemia, without long-term current use of insulin (HCC) We will refill Mounjaro 2.5 mg SubQ once a week for 1 month with 0 refills.  -Refill tirzepatide (MOUNJARO) 2.5 MG/0.5ML Pen; Inject 2.5 mg into the skin once a week.  Dispense: 2 mL; Refill: 0  2. Vitamin D deficiency We will refill Vit D 50k IU once a week for 1 month with 0 refills.  -Refill Vitamin D, Ergocalciferol, (DRISDOL) 1.25 MG (50000 UNIT) CAPS capsule; Take 1 capsule (50,000 Units total) by mouth every 7 (seven) days.  Dispense: 4 capsule; Refill: 0  3. Obesity with current BMI of 44.7 Barbara Trevino is currently in the action stage of change. As such, her goal is to continue with weight loss efforts. She has agreed to the Category 4 Plan.   Exercise goals: No exercise has  been prescribed at this time.  Behavioral modification strategies: increasing lean protein intake, meal planning and cooking strategies, keeping healthy foods in the home, and planning for success.  Barbara Trevino has agreed to follow-up with our clinic in 3 weeks. She was informed of the importance of frequent follow-up visits to maximize her success with intensive lifestyle modifications for her multiple health conditions.   Objective:   Blood pressure 127/81, pulse 88, temperature 97.7 F (36.5 C), height '5\' 8"'$  (1.727 m), weight 294 lb (133.4 kg), SpO2 100 %. Body mass index is 44.7 kg/m.  General: Cooperative, alert, well developed, in no acute distress. HEENT: Conjunctivae and lids unremarkable. Cardiovascular: Regular rhythm.  Lungs: Normal work of breathing. Neurologic: No focal deficits.   Lab Results  Component Value Date   CREATININE 1.07 (H) 06/18/2018   BUN 12 06/18/2018   NA 139 06/18/2018   K 4.6 06/18/2018   CL 97 06/18/2018   CO2 26 06/18/2018   Lab Results  Component Value Date   ALT 40 (H) 06/18/2018   AST 34 06/18/2018   ALKPHOS 94 06/18/2018   BILITOT 0.3 06/18/2018   Lab Results  Component Value Date   HGBA1C 7.1 (H) 10/31/2021   HGBA1C 6.3 (H) 06/18/2018   Lab Results  Component Value Date   INSULIN 39.0 (H) 10/31/2021   INSULIN 32.2 (H) 06/18/2018   Lab Results  Component Value Date   TSH 1.200 10/31/2021   Lab Results  Component Value Date   CHOL 178 10/31/2021   HDL 64 10/31/2021   LDLCALC 77 10/31/2021   TRIG 226 (H) 10/31/2021   Lab Results  Component Value Date   VD25OH 34.8 10/31/2021   VD25OH 27.4 (L) 06/18/2018   VD25OH 59 01/04/2011   Lab Results  Component Value Date   WBC 5.5 10/31/2021   HGB 13.3 10/31/2021   HCT 39.6 10/31/2021   MCV 88 10/31/2021   PLT 230 10/31/2021   No results found for: "IRON", "TIBC", "FERRITIN"  Attestation Statements:   Reviewed by clinician on day of visit: allergies, medications, problem  list, medical history, surgical history, family history, social history, and previous encounter notes.  I, Elnora Morrison, RMA am acting as transcriptionist for Coralie Common, MD.  I have reviewed the above documentation for accuracy and completeness, and I agree with the above. - Coralie Common, MD

## 2021-12-25 ENCOUNTER — Other Ambulatory Visit: Payer: Self-pay | Admitting: General Surgery

## 2021-12-25 DIAGNOSIS — Z853 Personal history of malignant neoplasm of breast: Secondary | ICD-10-CM

## 2022-01-01 ENCOUNTER — Other Ambulatory Visit (HOSPITAL_COMMUNITY): Payer: Self-pay

## 2022-01-02 ENCOUNTER — Other Ambulatory Visit: Payer: Self-pay | Admitting: General Surgery

## 2022-01-02 DIAGNOSIS — Z853 Personal history of malignant neoplasm of breast: Secondary | ICD-10-CM

## 2022-01-03 ENCOUNTER — Encounter (INDEPENDENT_AMBULATORY_CARE_PROVIDER_SITE_OTHER): Payer: Self-pay | Admitting: Family Medicine

## 2022-01-03 ENCOUNTER — Ambulatory Visit (INDEPENDENT_AMBULATORY_CARE_PROVIDER_SITE_OTHER): Payer: 59 | Admitting: Family Medicine

## 2022-01-03 ENCOUNTER — Other Ambulatory Visit (HOSPITAL_COMMUNITY): Payer: Self-pay

## 2022-01-03 VITALS — BP 112/73 | HR 84 | Temp 98.2°F | Ht 68.0 in | Wt 293.0 lb

## 2022-01-03 DIAGNOSIS — E1169 Type 2 diabetes mellitus with other specified complication: Secondary | ICD-10-CM

## 2022-01-03 DIAGNOSIS — E669 Obesity, unspecified: Secondary | ICD-10-CM | POA: Diagnosis not present

## 2022-01-03 DIAGNOSIS — E785 Hyperlipidemia, unspecified: Secondary | ICD-10-CM

## 2022-01-03 DIAGNOSIS — E559 Vitamin D deficiency, unspecified: Secondary | ICD-10-CM | POA: Diagnosis not present

## 2022-01-03 DIAGNOSIS — Z6841 Body Mass Index (BMI) 40.0 and over, adult: Secondary | ICD-10-CM

## 2022-01-03 DIAGNOSIS — Z7985 Long-term (current) use of injectable non-insulin antidiabetic drugs: Secondary | ICD-10-CM

## 2022-01-03 MED ORDER — VITAMIN D (ERGOCALCIFEROL) 1.25 MG (50000 UNIT) PO CAPS
50000.0000 [IU] | ORAL_CAPSULE | ORAL | 0 refills | Status: DC
  Filled 2022-01-03: qty 4, 28d supply, fill #0

## 2022-01-04 NOTE — Progress Notes (Signed)
Chief Complaint:   OBESITY Barbara Trevino is here to discuss her progress with her obesity treatment plan along with follow-up of her obesity related diagnoses. Barbara Trevino is on the Category 4 Plan and states she is following her eating plan approximately 60-70% of the time. Barbara Trevino states she is exercising 0 minutes 0 times per week.  Today's visit was #: 5 Starting weight: 296 lbs Starting date: 10/31/2021 Today's weight: 293 lbs Today's date: 01/03/2022 Total lbs lost to date: 3 lbs Total lbs lost since last in-office visit: 1  Interim History: Barbara Trevino has been mostly working and planning her mother's 80th birthday party (quite an event--115 guests). Still feels like she is not eating all food on plan. Wondering about cereal options.  Subjective:   1. Vitamin D deficiency Barbara Trevino is currently taking prescription Vit D 50,000 IU once a week. Denies any nausea, vomiting or muscle weakness. She notes fatigue. Last Vit D level of 34.8.  2. Hyperlipidemia associated with type 2 diabetes mellitus (Barbara Trevino) Barbara Trevino's last LDL of 77, HDL 64, Trigly 226. She is on Crestor.  Assessment/Plan:   1. Vitamin D deficiency We will refill Vit D 50k IU once a week for 1 month with 0 refills.  -Refill Vitamin D, Ergocalciferol, (DRISDOL) 1.25 MG (50000 UNIT) CAPS capsule; Take 1 capsule (50,000 Units total) by mouth every 7 (seven) days.  Dispense: 4 capsule; Refill: 0  2. Hyperlipidemia associated with type 2 diabetes mellitus (Barbara Trevino) Continue on statin without changes in dose.  3. Obesity with current BMI of 44.6 Barbara Trevino is currently in the action stage of change. As such, her goal is to continue with weight loss efforts. She has agreed to the Category 4 Plan.   Exercise goals: No exercise has been prescribed at this time.  Behavioral modification strategies: increasing lean protein intake, meal planning and cooking strategies, keeping healthy foods in the home, and planning for success.  Barbara Trevino has agreed to  follow-up with our clinic in 4 weeks. She was informed of the importance of frequent follow-up visits to maximize her success with intensive lifestyle modifications for her multiple health conditions.   Objective:   Blood pressure 112/73, pulse 84, temperature 98.2 F (36.8 C), height '5\' 8"'$  (1.727 m), weight 293 lb (132.9 kg), SpO2 96 %. Body mass index is 44.55 kg/m.  General: Cooperative, alert, well developed, in no acute distress. HEENT: Conjunctivae and lids unremarkable. Cardiovascular: Regular rhythm.  Lungs: Normal work of breathing. Neurologic: No focal deficits.   Lab Results  Component Value Date   CREATININE 1.07 (H) 06/18/2018   BUN 12 06/18/2018   NA 139 06/18/2018   K 4.6 06/18/2018   CL 97 06/18/2018   CO2 26 06/18/2018   Lab Results  Component Value Date   ALT 40 (H) 06/18/2018   AST 34 06/18/2018   ALKPHOS 94 06/18/2018   BILITOT 0.3 06/18/2018   Lab Results  Component Value Date   HGBA1C 7.1 (H) 10/31/2021   HGBA1C 6.3 (H) 06/18/2018   Lab Results  Component Value Date   INSULIN 39.0 (H) 10/31/2021   INSULIN 32.2 (H) 06/18/2018   Lab Results  Component Value Date   TSH 1.200 10/31/2021   Lab Results  Component Value Date   CHOL 178 10/31/2021   HDL 64 10/31/2021   LDLCALC 77 10/31/2021   TRIG 226 (H) 10/31/2021   Lab Results  Component Value Date   VD25OH 34.8 10/31/2021   VD25OH 27.4 (L) 06/18/2018   VD25OH 59  01/04/2011   Lab Results  Component Value Date   WBC 5.5 10/31/2021   HGB 13.3 10/31/2021   HCT 39.6 10/31/2021   MCV 88 10/31/2021   PLT 230 10/31/2021   No results found for: "IRON", "TIBC", "FERRITIN"  Attestation Statements:   Reviewed by clinician on day of visit: allergies, medications, problem list, medical history, surgical history, family history, social history, and previous encounter notes.  I, Elnora Morrison, RMA am acting as transcriptionist for Coralie Common, MD. I have reviewed the above documentation  for accuracy and completeness, and I agree with the above. - Coralie Common, MD

## 2022-01-16 ENCOUNTER — Other Ambulatory Visit (HOSPITAL_COMMUNITY): Payer: Self-pay

## 2022-01-16 MED ORDER — CEPHALEXIN 500 MG PO CAPS
500.0000 mg | ORAL_CAPSULE | Freq: Two times a day (BID) | ORAL | 0 refills | Status: DC
  Filled 2022-01-16: qty 14, 7d supply, fill #0

## 2022-02-05 ENCOUNTER — Other Ambulatory Visit: Payer: 59

## 2022-02-05 ENCOUNTER — Ambulatory Visit (INDEPENDENT_AMBULATORY_CARE_PROVIDER_SITE_OTHER): Payer: 59 | Admitting: Family Medicine

## 2022-02-05 ENCOUNTER — Encounter (INDEPENDENT_AMBULATORY_CARE_PROVIDER_SITE_OTHER): Payer: Self-pay | Admitting: Family Medicine

## 2022-02-05 ENCOUNTER — Other Ambulatory Visit (HOSPITAL_COMMUNITY): Payer: Self-pay

## 2022-02-05 VITALS — BP 129/82 | HR 87 | Temp 98.5°F | Ht 68.0 in | Wt 285.0 lb

## 2022-02-05 DIAGNOSIS — E1165 Type 2 diabetes mellitus with hyperglycemia: Secondary | ICD-10-CM | POA: Diagnosis not present

## 2022-02-05 DIAGNOSIS — Z7985 Long-term (current) use of injectable non-insulin antidiabetic drugs: Secondary | ICD-10-CM

## 2022-02-05 DIAGNOSIS — E669 Obesity, unspecified: Secondary | ICD-10-CM | POA: Diagnosis not present

## 2022-02-05 DIAGNOSIS — Z6841 Body Mass Index (BMI) 40.0 and over, adult: Secondary | ICD-10-CM | POA: Diagnosis not present

## 2022-02-05 DIAGNOSIS — E559 Vitamin D deficiency, unspecified: Secondary | ICD-10-CM

## 2022-02-05 MED ORDER — VITAMIN D (ERGOCALCIFEROL) 1.25 MG (50000 UNIT) PO CAPS
50000.0000 [IU] | ORAL_CAPSULE | ORAL | 0 refills | Status: DC
  Filled 2022-02-05: qty 4, 28d supply, fill #0

## 2022-02-05 MED ORDER — TIRZEPATIDE 5 MG/0.5ML ~~LOC~~ SOAJ
5.0000 mg | SUBCUTANEOUS | 0 refills | Status: DC
  Filled 2022-02-05: qty 2, 28d supply, fill #0

## 2022-02-12 NOTE — Progress Notes (Signed)
Chief Complaint:   OBESITY Barbara Trevino is here to discuss her progress with her obesity treatment plan along with follow-up of her obesity related diagnoses. Barbara Trevino is on the Category 4 Plan and states she is following her eating plan approximately 45% of the time. Barbara Trevino states she is exercising 0 minutes 0 times per week.  Today's visit was #: 6 Starting weight: 296 lbs Starting date: 10/31/2021 Today's weight: 285 lbs Today's date: 02/05/2022 Total lbs lost to date: 11 lbs Total lbs lost since last in-office visit: 8  Interim History: Barbara Trevino feels she did the worst in terms of compliance to meal plan. Wondering about increased dose of Mounjaro. Is going to her mom's for Thanksgiving. She is having a local Christmas. Has has significant diarrhea the last few days.  Subjective:   1. Type 2 diabetes mellitus with hyperglycemia, without long-term current use of insulin (HCC) Barbara Trevino's last A1c was 7.1, Insulin was 39.0. She is on Mounjaro. Denies GI side effects.  2. Vitamin D deficiency Barbara Trevino's last Vit D level of 34.8.Marland Kitchen Denies any nausea, vomiting or muscle weakness. She notes fatigue.  Assessment/Plan:   1. Type 2 diabetes mellitus with hyperglycemia, without long-term current use of insulin (HCC) Refill/Increase Mounjaro to 5 mg SubQ once weekly for 1 month with 0 refills.  -Refill/Increase tirzepatide (MOUNJARO) 5 MG/0.5ML Pen; Inject 5 mg into the skin once a week.  Dispense: 2 mL; Refill: 0  2. Vitamin D deficiency We will refill Vit D 50k IU once weekly for 1 month with 0 refills.  -Refill Vitamin D, Ergocalciferol, (DRISDOL) 1.25 MG (50000 UNIT) CAPS capsule; Take 1 capsule (50,000 Units total) by mouth every 7 (seven) days.  Dispense: 4 capsule; Refill: 0  3. Obesity with current BMI of 43.4 Barbara Trevino is currently in the action stage of change. As such, her goal is to continue with weight loss efforts. She has agreed to the Category 4 Plan.   Exercise goals: No exercise has  been prescribed at this time.  Behavioral modification strategies: increasing lean protein intake, meal planning and cooking strategies, keeping healthy foods in the home, and planning for success.  Barbara Trevino has agreed to follow-up with our clinic in 4 weeks. She was informed of the importance of frequent follow-up visits to maximize her success with intensive lifestyle modifications for her multiple health conditions.   Objective:   Blood pressure 129/82, pulse 87, temperature 98.5 F (36.9 C), height '5\' 8"'$  (1.727 m), weight 285 lb (129.3 kg), SpO2 99 %. Body mass index is 43.33 kg/m.  General: Cooperative, alert, well developed, in no acute distress. HEENT: Conjunctivae and lids unremarkable. Cardiovascular: Regular rhythm.  Lungs: Normal work of breathing. Neurologic: No focal deficits.   Lab Results  Component Value Date   CREATININE 1.07 (H) 06/18/2018   BUN 12 06/18/2018   NA 139 06/18/2018   K 4.6 06/18/2018   CL 97 06/18/2018   CO2 26 06/18/2018   Lab Results  Component Value Date   ALT 40 (H) 06/18/2018   AST 34 06/18/2018   ALKPHOS 94 06/18/2018   BILITOT 0.3 06/18/2018   Lab Results  Component Value Date   HGBA1C 7.1 (H) 10/31/2021   HGBA1C 6.3 (H) 06/18/2018   Lab Results  Component Value Date   INSULIN 39.0 (H) 10/31/2021   INSULIN 32.2 (H) 06/18/2018   Lab Results  Component Value Date   TSH 1.200 10/31/2021   Lab Results  Component Value Date   CHOL 178 10/31/2021  HDL 64 10/31/2021   LDLCALC 77 10/31/2021   TRIG 226 (H) 10/31/2021   Lab Results  Component Value Date   VD25OH 34.8 10/31/2021   VD25OH 27.4 (L) 06/18/2018   VD25OH 59 01/04/2011   Lab Results  Component Value Date   WBC 5.5 10/31/2021   HGB 13.3 10/31/2021   HCT 39.6 10/31/2021   MCV 88 10/31/2021   PLT 230 10/31/2021   No results found for: "IRON", "TIBC", "FERRITIN"  Attestation Statements:   Reviewed by clinician on day of visit: allergies, medications, problem  list, medical history, surgical history, family history, social history, and previous encounter notes.  I, Elnora Morrison, RMA am acting as transcriptionist for Coralie Common, MD.  I have reviewed the above documentation for accuracy and completeness, and I agree with the above. - Coralie Common, MD

## 2022-02-14 ENCOUNTER — Ambulatory Visit: Admission: RE | Admit: 2022-02-14 | Payer: 59 | Source: Ambulatory Visit

## 2022-02-14 ENCOUNTER — Ambulatory Visit
Admission: RE | Admit: 2022-02-14 | Discharge: 2022-02-14 | Disposition: A | Discharge status: Home / Self Care | Payer: 59 | Source: Ambulatory Visit | Attending: General Surgery | Admitting: General Surgery

## 2022-02-14 DIAGNOSIS — Z853 Personal history of malignant neoplasm of breast: Secondary | ICD-10-CM

## 2022-02-14 DIAGNOSIS — R92313 Mammographic fatty tissue density, bilateral breasts: Secondary | ICD-10-CM | POA: Diagnosis not present

## 2022-02-14 HISTORY — DX: Encounter for nonprocreative screening for genetic disease carrier status: Z13.71

## 2022-03-05 ENCOUNTER — Encounter (INDEPENDENT_AMBULATORY_CARE_PROVIDER_SITE_OTHER): Payer: Self-pay | Admitting: Family Medicine

## 2022-03-05 ENCOUNTER — Encounter (INDEPENDENT_AMBULATORY_CARE_PROVIDER_SITE_OTHER): Payer: 59 | Admitting: Physician Assistant

## 2022-03-05 ENCOUNTER — Ambulatory Visit (INDEPENDENT_AMBULATORY_CARE_PROVIDER_SITE_OTHER): Payer: 59 | Admitting: Family Medicine

## 2022-03-05 ENCOUNTER — Encounter (INDEPENDENT_AMBULATORY_CARE_PROVIDER_SITE_OTHER): Payer: Self-pay

## 2022-03-05 ENCOUNTER — Encounter (INDEPENDENT_AMBULATORY_CARE_PROVIDER_SITE_OTHER): Payer: Self-pay | Admitting: Physician Assistant

## 2022-03-05 ENCOUNTER — Other Ambulatory Visit (HOSPITAL_COMMUNITY): Payer: Self-pay

## 2022-03-05 DIAGNOSIS — K219 Gastro-esophageal reflux disease without esophagitis: Secondary | ICD-10-CM | POA: Diagnosis not present

## 2022-03-05 DIAGNOSIS — Z0000 Encounter for general adult medical examination without abnormal findings: Secondary | ICD-10-CM | POA: Diagnosis not present

## 2022-03-05 DIAGNOSIS — E782 Mixed hyperlipidemia: Secondary | ICD-10-CM | POA: Diagnosis not present

## 2022-03-05 DIAGNOSIS — E1169 Type 2 diabetes mellitus with other specified complication: Secondary | ICD-10-CM | POA: Diagnosis not present

## 2022-03-05 DIAGNOSIS — K760 Fatty (change of) liver, not elsewhere classified: Secondary | ICD-10-CM | POA: Diagnosis not present

## 2022-03-05 DIAGNOSIS — B009 Herpesviral infection, unspecified: Secondary | ICD-10-CM | POA: Diagnosis not present

## 2022-03-05 DIAGNOSIS — R609 Edema, unspecified: Secondary | ICD-10-CM | POA: Diagnosis not present

## 2022-03-05 DIAGNOSIS — G620 Drug-induced polyneuropathy: Secondary | ICD-10-CM | POA: Diagnosis not present

## 2022-03-05 DIAGNOSIS — M1990 Unspecified osteoarthritis, unspecified site: Secondary | ICD-10-CM | POA: Diagnosis not present

## 2022-03-05 MED ORDER — GABAPENTIN 300 MG PO CAPS
600.0000 mg | ORAL_CAPSULE | Freq: Every evening | ORAL | 2 refills | Status: DC
  Filled 2022-03-28 – 2022-03-29 (×2): qty 180, 90d supply, fill #0
  Filled 2022-07-03: qty 180, 90d supply, fill #1

## 2022-03-05 MED ORDER — VENLAFAXINE HCL ER 150 MG PO CP24
150.0000 mg | ORAL_CAPSULE | Freq: Every day | ORAL | 2 refills | Status: DC
  Filled 2022-03-05 – 2022-05-03 (×2): qty 90, 90d supply, fill #0
  Filled 2022-07-26: qty 90, 90d supply, fill #1

## 2022-03-05 MED ORDER — PANTOPRAZOLE SODIUM 40 MG PO TBEC
40.0000 mg | DELAYED_RELEASE_TABLET | Freq: Every day | ORAL | 2 refills | Status: DC
  Filled 2022-03-05: qty 90, 90d supply, fill #0
  Filled 2022-06-07: qty 90, 90d supply, fill #1
  Filled 2022-08-22: qty 90, 90d supply, fill #2

## 2022-03-05 MED ORDER — AMITRIPTYLINE HCL 25 MG PO TABS
25.0000 mg | ORAL_TABLET | Freq: Every day | ORAL | 2 refills | Status: DC
  Filled 2022-03-05: qty 27, 13d supply, fill #0
  Filled 2022-05-04: qty 90, 45d supply, fill #0
  Filled 2022-06-26: qty 90, 45d supply, fill #1
  Filled 2022-08-22: qty 90, 45d supply, fill #2

## 2022-03-05 MED ORDER — ROSUVASTATIN CALCIUM 20 MG PO TABS
20.0000 mg | ORAL_TABLET | Freq: Every day | ORAL | 2 refills | Status: DC
  Filled 2022-03-05: qty 90, 90d supply, fill #0
  Filled 2022-06-07: qty 90, 90d supply, fill #1
  Filled 2022-08-22: qty 90, 90d supply, fill #2

## 2022-03-05 NOTE — Progress Notes (Signed)
This encounter was created in error - please disregard.

## 2022-03-05 NOTE — Progress Notes (Unsigned)
TeleHealth Visit:  This visit was completed with telemedicine (audio/video) technology. [name] has verbally consented to this TeleHealth visit. The patient is located at home, the provider is located at home. The participants in this visit include the listed provider and patient. The visit was conducted today via MyChart video.  OBESITY Barbara Trevino is here to discuss her progress with her obesity treatment plan along with follow-up of her obesity related diagnoses.   Today's visit was # 7 Starting weight: 296 lbs Starting date: 10/31/2021 Weight at last in office visit: 285 lbs on 02/05/22 Total weight loss: 11 lbs at last in office visit on 02/05/22. Today's reported weight: *** lbs No weight reported.  Nutrition Plan: the Category 4 Plan.   Current exercise: {exercise types:16438} none  Interim History: ***  Assessment/Plan:  1. ***  2. ***  3. ***  Obesity: Current BMI *** Barbara Trevino {CHL AMB IS/IS NOT:210130109} currently in the action stage of change. As such, her goal is to {MWMwtloss#1:210800005}.  She has agreed to {MWMwtlossportion/plan2:23431}.   Exercise goals: {MWM EXERCISE RECS:23473}  Behavioral modification strategies: {MWMwtlossdietstrategies3:23432}.  Mayuri has agreed to follow-up with our clinic in {NUMBER 1-10:22536} weeks.   No orders of the defined types were placed in this encounter.   There are no discontinued medications.   No orders of the defined types were placed in this encounter.     Objective:   VITALS: Per patient if applicable, see vitals. GENERAL: Alert and in no acute distress. CARDIOPULMONARY: No increased WOB. Speaking in clear sentences.  PSYCH: Pleasant and cooperative. Speech normal rate and rhythm. Affect is appropriate. Insight and judgement are appropriate. Attention is focused, linear, and appropriate.  NEURO: Oriented as arrived to appointment on time with no prompting.   Lab Results  Component Value Date   CREATININE 1.07  (H) 06/18/2018   BUN 12 06/18/2018   NA 139 06/18/2018   K 4.6 06/18/2018   CL 97 06/18/2018   CO2 26 06/18/2018   Lab Results  Component Value Date   ALT 40 (H) 06/18/2018   AST 34 06/18/2018   ALKPHOS 94 06/18/2018   BILITOT 0.3 06/18/2018   Lab Results  Component Value Date   HGBA1C 7.1 (H) 10/31/2021   HGBA1C 6.3 (H) 06/18/2018   Lab Results  Component Value Date   INSULIN 39.0 (H) 10/31/2021   INSULIN 32.2 (H) 06/18/2018   Lab Results  Component Value Date   TSH 1.200 10/31/2021   Lab Results  Component Value Date   CHOL 178 10/31/2021   HDL 64 10/31/2021   LDLCALC 77 10/31/2021   TRIG 226 (H) 10/31/2021   Lab Results  Component Value Date   WBC 5.5 10/31/2021   HGB 13.3 10/31/2021   HCT 39.6 10/31/2021   MCV 88 10/31/2021   PLT 230 10/31/2021   No results found for: "IRON", "TIBC", "FERRITIN" Lab Results  Component Value Date   VD25OH 34.8 10/31/2021   VD25OH 27.4 (L) 06/18/2018   VD25OH 59 01/04/2011    Attestation Statements:   Reviewed by clinician on day of visit: allergies, medications, problem list, medical history, surgical history, family history, social history, and previous encounter notes.  ***(delete if time-based billing not used) Time spent on visit including the items listed below was *** minutes.  -preparing to see the patient (e.g., review of tests, history, previous notes) -obtaining and/or reviewing separately obtained history -counseling and educating the patient/family/caregiver -documenting clinical information in the electronic or other health record -ordering medications, tests, or  procedures -independently interpreting results and communicating results to the patient/ family/caregiver -referring and communicating with other health care professionals  -care coordination

## 2022-03-06 ENCOUNTER — Other Ambulatory Visit (HOSPITAL_COMMUNITY): Payer: Self-pay

## 2022-03-06 ENCOUNTER — Encounter (INDEPENDENT_AMBULATORY_CARE_PROVIDER_SITE_OTHER): Payer: Self-pay | Admitting: Family Medicine

## 2022-03-06 ENCOUNTER — Telehealth (INDEPENDENT_AMBULATORY_CARE_PROVIDER_SITE_OTHER): Payer: 59 | Admitting: Family Medicine

## 2022-03-06 DIAGNOSIS — E669 Obesity, unspecified: Secondary | ICD-10-CM

## 2022-03-06 DIAGNOSIS — Z6841 Body Mass Index (BMI) 40.0 and over, adult: Secondary | ICD-10-CM | POA: Diagnosis not present

## 2022-03-06 DIAGNOSIS — E559 Vitamin D deficiency, unspecified: Secondary | ICD-10-CM | POA: Diagnosis not present

## 2022-03-06 DIAGNOSIS — Z7985 Long-term (current) use of injectable non-insulin antidiabetic drugs: Secondary | ICD-10-CM | POA: Diagnosis not present

## 2022-03-06 DIAGNOSIS — E1165 Type 2 diabetes mellitus with hyperglycemia: Secondary | ICD-10-CM | POA: Diagnosis not present

## 2022-03-06 MED ORDER — TIRZEPATIDE 5 MG/0.5ML ~~LOC~~ SOAJ
5.0000 mg | SUBCUTANEOUS | 0 refills | Status: DC
  Filled 2022-03-06: qty 2, 28d supply, fill #0

## 2022-03-06 MED ORDER — CYCLOBENZAPRINE HCL 10 MG PO TABS
10.0000 mg | ORAL_TABLET | Freq: Three times a day (TID) | ORAL | 1 refills | Status: DC
  Filled 2022-03-06: qty 90, 30d supply, fill #0
  Filled 2022-04-03 – 2022-04-06 (×2): qty 90, 30d supply, fill #1

## 2022-03-06 MED ORDER — VITAMIN D (ERGOCALCIFEROL) 1.25 MG (50000 UNIT) PO CAPS
50000.0000 [IU] | ORAL_CAPSULE | ORAL | 0 refills | Status: DC
  Filled 2022-03-06: qty 4, 28d supply, fill #0

## 2022-03-24 ENCOUNTER — Telehealth: Payer: Self-pay | Admitting: Urgent Care

## 2022-03-24 DIAGNOSIS — N3000 Acute cystitis without hematuria: Secondary | ICD-10-CM

## 2022-03-24 MED ORDER — CEPHALEXIN 500 MG PO CAPS: 500.0000 mg | ORAL_CAPSULE | Freq: Two times a day (BID) | ORAL | 0 refills | Status: AC

## 2022-03-24 NOTE — Progress Notes (Signed)
E-Visit for Urinary Problems  We are sorry that you are not feeling well.  Here is how we plan to help!  Based on what you shared with me it looks like you most likely have a simple urinary tract infection.  A UTI (Urinary Tract Infection) is a bacterial infection of the bladder.  Most cases of urinary tract infections are simple to treat but a key part of your care is to encourage you to drink plenty of fluids and watch your symptoms carefully.  I have prescribed Keflex 500 mg twice a day for 7 days.  Your symptoms should gradually improve. Call us if the burning in your urine worsens, you develop worsening fever, back pain or pelvic pain or if your symptoms do not resolve after completing the antibiotic.  Urinary tract infections can be prevented by drinking plenty of water to keep your body hydrated.  Also be sure when you wipe, wipe from front to back and don't hold it in!  If possible, empty your bladder every 4 hours.  HOME CARE Drink plenty of fluids Compete the full course of the antibiotics even if the symptoms resolve Remember, when you need to go.go. Holding in your urine can increase the likelihood of getting a UTI! GET HELP RIGHT AWAY IF: You cannot urinate You get a high fever Worsening back pain occurs You see blood in your urine You feel sick to your stomach or throw up You feel like you are going to pass out  MAKE SURE YOU  Understand these instructions. Will watch your condition. Will get help right away if you are not doing well or get worse.   Thank you for choosing an e-visit.  Your e-visit answers were reviewed by a board certified advanced clinical practitioner to complete your personal care plan. Depending upon the condition, your plan could have included both over the counter or prescription medications.  Please review your pharmacy choice. Make sure the pharmacy is open so you can pick up prescription now. If there is a problem, you may contact your  provider through [employer] and have the prescription routed to another pharmacy.  Your safety is important to [place]. If you have drug allergies check your prescription carefully.   For the next 24 hours you can use MyChart to ask questions about today's visit, request a non-urgent call back, or ask for a work or school excuse. You will get an email in the next two days asking about your experience. I hope that your e-visit has been valuable and will speed your recovery.   I have spent 5 minutes in review of e-visit questionnaire, review and updating patient chart, medical decision making and response to patient.   [place], PA

## 2022-03-29 ENCOUNTER — Other Ambulatory Visit: Payer: Self-pay

## 2022-03-29 ENCOUNTER — Other Ambulatory Visit (HOSPITAL_COMMUNITY): Payer: Self-pay

## 2022-04-03 ENCOUNTER — Ambulatory Visit (INDEPENDENT_AMBULATORY_CARE_PROVIDER_SITE_OTHER): Payer: Commercial Managed Care - PPO | Admitting: Family Medicine

## 2022-04-03 ENCOUNTER — Other Ambulatory Visit (HOSPITAL_COMMUNITY): Payer: Self-pay

## 2022-04-03 ENCOUNTER — Encounter (INDEPENDENT_AMBULATORY_CARE_PROVIDER_SITE_OTHER): Payer: Self-pay | Admitting: Family Medicine

## 2022-04-03 VITALS — BP 137/83 | HR 80 | Temp 98.0°F | Ht 68.0 in | Wt 277.8 lb

## 2022-04-03 DIAGNOSIS — E669 Obesity, unspecified: Secondary | ICD-10-CM | POA: Diagnosis not present

## 2022-04-03 DIAGNOSIS — Z6841 Body Mass Index (BMI) 40.0 and over, adult: Secondary | ICD-10-CM | POA: Diagnosis not present

## 2022-04-03 DIAGNOSIS — E559 Vitamin D deficiency, unspecified: Secondary | ICD-10-CM

## 2022-04-03 DIAGNOSIS — Z7985 Long-term (current) use of injectable non-insulin antidiabetic drugs: Secondary | ICD-10-CM

## 2022-04-03 DIAGNOSIS — E1165 Type 2 diabetes mellitus with hyperglycemia: Secondary | ICD-10-CM | POA: Diagnosis not present

## 2022-04-03 MED ORDER — TIRZEPATIDE 5 MG/0.5ML ~~LOC~~ SOAJ
5.0000 mg | SUBCUTANEOUS | 0 refills | Status: DC
  Filled 2022-04-03: qty 2, 28d supply, fill #0

## 2022-04-03 MED ORDER — VITAMIN D (ERGOCALCIFEROL) 1.25 MG (50000 UNIT) PO CAPS
50000.0000 [IU] | ORAL_CAPSULE | ORAL | 0 refills | Status: DC
  Filled 2022-04-03: qty 4, 28d supply, fill #0

## 2022-04-06 ENCOUNTER — Other Ambulatory Visit (HOSPITAL_COMMUNITY): Payer: Self-pay

## 2022-04-06 DIAGNOSIS — N3020 Other chronic cystitis without hematuria: Secondary | ICD-10-CM | POA: Diagnosis not present

## 2022-04-06 DIAGNOSIS — R8271 Bacteriuria: Secondary | ICD-10-CM | POA: Diagnosis not present

## 2022-04-06 MED ORDER — SULFAMETHOXAZOLE-TRIMETHOPRIM 800-160 MG PO TABS
1.0000 | ORAL_TABLET | Freq: Two times a day (BID) | ORAL | 0 refills | Status: DC
  Filled 2022-04-06: qty 14, 7d supply, fill #0

## 2022-04-06 MED ORDER — CIPROFLOXACIN HCL 500 MG PO TABS
500.0000 mg | ORAL_TABLET | Freq: Two times a day (BID) | ORAL | 0 refills | Status: DC
  Filled 2022-04-06: qty 14, 7d supply, fill #0

## 2022-04-09 ENCOUNTER — Other Ambulatory Visit (HOSPITAL_COMMUNITY): Payer: Self-pay

## 2022-04-09 MED ORDER — CEPHALEXIN 250 MG PO CAPS
250.0000 mg | ORAL_CAPSULE | Freq: Every day | ORAL | 6 refills | Status: DC
  Filled 2022-04-09: qty 30, 30d supply, fill #0
  Filled 2022-05-03: qty 30, 30d supply, fill #1
  Filled 2022-06-04: qty 30, 30d supply, fill #2
  Filled 2022-07-03: qty 30, 30d supply, fill #3
  Filled 2022-07-30: qty 30, 30d supply, fill #4
  Filled 2022-09-06: qty 30, 30d supply, fill #5
  Filled 2022-09-24 – 2022-10-04 (×2): qty 30, 30d supply, fill #6

## 2022-04-15 NOTE — Progress Notes (Signed)
Chief Complaint:   OBESITY Barbara Trevino is here to discuss her progress with her obesity treatment plan along with follow-up of her obesity related diagnoses. Barbara Trevino is on the Category 4 Plan and states she is following her eating plan approximately 60% of the time. [name] states she is exercising 0 minutes 0 times per week.  Today's visit was #: 8 Starting weight: 296 lbs Starting date: 10/31/2021 Today's weight: 277 lbs Today's date: 04/03/2022 Total lbs lost to date: 19 lbs Total lbs lost since last in-office visit: 8  Interim History: [name] has been working and celebrating Christmas and New Years.  She had a few indulgences and has tried to stay focused with attention to protein.  Next few weeks she wants to adhere to Cat 4.  Subjective:   1. Type 2 diabetes mellitus with hyperglycemia, without long-term current use of insulin (HCC) Barbara Trevino is on Mounjaro 5 mg weekly.  Noticing a decrease in appetite.  2. Vitamin D deficiency Barbara Trevino is currently taking prescription Vit D 50,000 IU once a week. Denies any nausea, vomiting or muscle weakness.  She notes fatigue.  Assessment/Plan:   1. Type 2 diabetes mellitus with hyperglycemia, without long-term current use of insulin (HCC) We will refill Mounjaro 5 mg SubQ once a week for 1 month with 0 refills.  -Refill tirzepatide (MOUNJARO) 5 MG/0.5ML Pen; Inject 5 mg into the skin once a week.  Dispense: 2 mL; Refill: 0  2. Vitamin D deficiency We will refill Vit D 50K IU once weekly for 1 month with 0 refills.  -Refill Vitamin D, Ergocalciferol, (DRISDOL) 1.25 MG (50000 UNIT) CAPS capsule; Take 1 capsule (50,000 Units total) by mouth every 7 (seven) days.  Dispense: 4 capsule; Refill: 0  3. Obesity with current BMI of 42.2 Barbara Trevino is currently in the action stage of change. As such, her goal is to continue with weight loss efforts. She has agreed to the Category 4 Plan.   Exercise goals: All adults should avoid inactivity. Some physical  activity is better than none, and adults who participate in any amount of physical activity gain some health benefits.  Barbara Trevino is to start planning a structured activity plan.  Behavioral modification strategies: increasing lean protein intake, meal planning and cooking strategies, keeping healthy foods in the home, and planning for success.  Barbara Trevino has agreed to follow-up with our clinic in 3 weeks. She was informed of the importance of frequent follow-up visits to maximize her success with intensive lifestyle modifications for her multiple health conditions.   Objective:   Blood pressure 137/83, pulse 80, temperature 98 F (36.7 C), height '5\' 8"'$  (1.727 m), weight 277 lb 12.8 oz (126 kg), SpO2 94 %. Body mass index is 42.24 kg/m.  General: Cooperative, alert, well developed, in no acute distress. HEENT: Conjunctivae and lids unremarkable. Cardiovascular: Regular rhythm.  Lungs: Normal work of breathing. Neurologic: No focal deficits.   Lab Results  Component Value Date   CREATININE 1.07 (H) 06/18/2018   BUN 12 06/18/2018   NA 139 06/18/2018   K 4.6 06/18/2018   CL 97 06/18/2018   CO2 26 06/18/2018   Lab Results  Component Value Date   ALT 40 (H) 06/18/2018   AST 34 06/18/2018   ALKPHOS 94 06/18/2018   BILITOT 0.3 06/18/2018   Lab Results  Component Value Date   HGBA1C 7.1 (H) 10/31/2021   HGBA1C 6.3 (H) 06/18/2018   Lab Results  Component Value Date   INSULIN 39.0 (H) 10/31/2021  INSULIN 32.2 (H) 06/18/2018   Lab Results  Component Value Date   TSH 1.200 10/31/2021   Lab Results  Component Value Date   CHOL 178 10/31/2021   HDL 64 10/31/2021   LDLCALC 77 10/31/2021   TRIG 226 (H) 10/31/2021   Lab Results  Component Value Date   VD25OH 34.8 10/31/2021   VD25OH 27.4 (L) 06/18/2018   VD25OH 59 01/04/2011   Lab Results  Component Value Date   WBC 5.5 10/31/2021   HGB 13.3 10/31/2021   HCT 39.6 10/31/2021   MCV 88 10/31/2021   PLT 230 10/31/2021   No  results found for: "IRON", "TIBC", "FERRITIN"  Attestation Statements:   Reviewed by clinician on day of visit: allergies, medications, problem list, medical history, surgical history, family history, social history, and previous encounter notes.  I, Elnora Morrison, RMA am acting as transcriptionist for Coralie Common, MD.  I have reviewed the above documentation for accuracy and completeness, and I agree with the above. - Coralie Common, MD

## 2022-04-20 DIAGNOSIS — N3020 Other chronic cystitis without hematuria: Secondary | ICD-10-CM | POA: Diagnosis not present

## 2022-05-01 ENCOUNTER — Other Ambulatory Visit (HOSPITAL_COMMUNITY): Payer: Self-pay

## 2022-05-01 ENCOUNTER — Ambulatory Visit (INDEPENDENT_AMBULATORY_CARE_PROVIDER_SITE_OTHER): Payer: Commercial Managed Care - PPO | Admitting: Family Medicine

## 2022-05-01 ENCOUNTER — Encounter (INDEPENDENT_AMBULATORY_CARE_PROVIDER_SITE_OTHER): Payer: Self-pay | Admitting: Family Medicine

## 2022-05-01 VITALS — BP 134/79 | HR 87 | Temp 97.6°F | Ht 68.0 in | Wt 275.0 lb

## 2022-05-01 DIAGNOSIS — E559 Vitamin D deficiency, unspecified: Secondary | ICD-10-CM

## 2022-05-01 DIAGNOSIS — E1165 Type 2 diabetes mellitus with hyperglycemia: Secondary | ICD-10-CM | POA: Diagnosis not present

## 2022-05-01 DIAGNOSIS — Z7985 Long-term (current) use of injectable non-insulin antidiabetic drugs: Secondary | ICD-10-CM

## 2022-05-01 DIAGNOSIS — Z6841 Body Mass Index (BMI) 40.0 and over, adult: Secondary | ICD-10-CM | POA: Diagnosis not present

## 2022-05-01 MED ORDER — VITAMIN D (ERGOCALCIFEROL) 1.25 MG (50000 UNIT) PO CAPS
50000.0000 [IU] | ORAL_CAPSULE | ORAL | 0 refills | Status: DC
  Filled 2022-05-01: qty 4, 28d supply, fill #0

## 2022-05-01 MED ORDER — TIRZEPATIDE 5 MG/0.5ML ~~LOC~~ SOAJ
5.0000 mg | SUBCUTANEOUS | 0 refills | Status: DC
  Filled 2022-05-01: qty 2, 28d supply, fill #0

## 2022-05-04 ENCOUNTER — Other Ambulatory Visit (HOSPITAL_COMMUNITY): Payer: Self-pay

## 2022-05-09 DIAGNOSIS — N3020 Other chronic cystitis without hematuria: Secondary | ICD-10-CM | POA: Diagnosis not present

## 2022-05-09 DIAGNOSIS — Z8551 Personal history of malignant neoplasm of bladder: Secondary | ICD-10-CM | POA: Diagnosis not present

## 2022-05-10 NOTE — Progress Notes (Signed)
Chief Complaint:   OBESITY Barbara Trevino is here to discuss her progress with her obesity treatment plan along with follow-up of her obesity related diagnoses. Barbara Trevino is on the Category 4 Plan and states she is following her eating plan approximately 50% of the time. Barbara Trevino states she is exercising 0 minutes 0 times per week.  Today's visit was #: 9 Starting weight: 296 lbs Starting date: 10/31/2021 Today's weight: 275 lbs Today's date: 05/01/2022 Total lbs lost to date: 21 lbs Total lbs lost since last in-office visit: 2  Interim History: Barbara Trevino voices she feels she did not do as well this time around.  Feels like the quantity of food just feels overwhelming.  Has not been able to get all food in.  Experiencing some shoulder discomfort.  She is planning a trip to [place] to see her soon to be grandchild.  Subjective:   1. Vitamin D deficiency Barbara Trevino is currently taking prescription Vit D 50,000 IU once a week.  Denies any nausea, vomiting or muscle weakness.  She notes fatigue.  2. Type 2 diabetes mellitus with hyperglycemia, without long-term current use of insulin (HCC) Barbara Trevino is on Mounjaro 5 mg weekly.  Occasionally experiencing GI side effects in the form of sour burps.  Assessment/Plan:   1. Vitamin D deficiency We will refill Vit D 50K IU once a week for 1 month with 0 refills.  -Refill Vitamin D, Ergocalciferol, (DRISDOL) 1.25 MG (50000 UNIT) CAPS capsule; Take 1 capsule (50,000 Units total) by mouth every 7 (seven) days.  Dispense: 4 capsule; Refill: 0  2. Type 2 diabetes mellitus with hyperglycemia, without long-term current use of insulin (HCC) We will refill Mounjaro 5 mg SubQ once a week for 1 month with 0 refills.  -Refill tirzepatide (MOUNJARO) 5 MG/0.5ML Pen; Inject 5 mg into the skin once a week.  Dispense: 2 mL; Refill: 0  3. Morbid obesity ([place]), starting BMI of 45.0  4. BMI 40.0-44.9, adult [facility]) Barbara Trevino is currently in the action stage of change. As such, her goal is  to continue with weight loss efforts. She has agreed to the Category 4 Plan.   Exercise goals: All adults should avoid inactivity. Some physical activity is better than none, and adults who participate in any amount of physical activity gain some health benefits.  Behavioral modification strategies: increasing lean protein intake, meal planning and cooking strategies, keeping healthy foods in the home, and planning for success.  Barbara Trevino has agreed to follow-up with our clinic in 3 weeks. She was informed of the importance of frequent follow-up visits to maximize her success with intensive lifestyle modifications for her multiple health conditions.   Objective:   Blood pressure 134/79, pulse 87, temperature 97.6 F (36.4 C), height 5' 8"$  (1.727 m), weight 275 lb (124.7 kg), SpO2 100 %. Body mass index is 41.81 kg/m.  General: Cooperative, alert, well developed, in no acute distress. HEENT: Conjunctivae and lids unremarkable. Cardiovascular: Regular rhythm.  Lungs: Normal work of breathing. Neurologic: No focal deficits.   Lab Results  Component Value Date   CREATININE 1.07 (H) 06/18/2018   BUN 12 06/18/2018   NA 139 06/18/2018   K 4.6 06/18/2018   CL 97 06/18/2018   CO2 26 06/18/2018   Lab Results  Component Value Date   ALT 40 (H) 06/18/2018   AST 34 06/18/2018   ALKPHOS 94 06/18/2018   BILITOT 0.3 06/18/2018   Lab Results  Component Value Date   HGBA1C 7.1 (H) 10/31/2021  HGBA1C 6.3 (H) 06/18/2018   Lab Results  Component Value Date   INSULIN 39.0 (H) 10/31/2021   INSULIN 32.2 (H) 06/18/2018   Lab Results  Component Value Date   TSH 1.200 10/31/2021   Lab Results  Component Value Date   CHOL 178 10/31/2021   HDL 64 10/31/2021   LDLCALC 77 10/31/2021   TRIG 226 (H) 10/31/2021   Lab Results  Component Value Date   VD25OH 34.8 10/31/2021   VD25OH 27.4 (L) 06/18/2018   VD25OH 59 01/04/2011   Lab Results  Component Value Date   WBC 5.5 10/31/2021   HGB  13.3 10/31/2021   HCT 39.6 10/31/2021   MCV 88 10/31/2021   PLT 230 10/31/2021   No results found for: "IRON", "TIBC", "FERRITIN"  Attestation Statements:   Reviewed by clinician on day of visit: allergies, medications, problem list, medical history, surgical history, family history, social history, and previous encounter notes.  I, Elnora Morrison, RMA am acting as transcriptionist for Coralie Common, MD.  I have reviewed the above documentation for accuracy and completeness, and I agree with the above. - Coralie Common, MD

## 2022-05-28 ENCOUNTER — Ambulatory Visit (INDEPENDENT_AMBULATORY_CARE_PROVIDER_SITE_OTHER): Payer: Commercial Managed Care - PPO | Admitting: Family Medicine

## 2022-05-28 ENCOUNTER — Encounter (INDEPENDENT_AMBULATORY_CARE_PROVIDER_SITE_OTHER): Payer: Self-pay | Admitting: Family Medicine

## 2022-05-28 ENCOUNTER — Other Ambulatory Visit (HOSPITAL_COMMUNITY): Payer: Self-pay

## 2022-05-28 VITALS — BP 112/76 | HR 89 | Temp 98.0°F | Ht 68.0 in | Wt 273.0 lb

## 2022-05-28 DIAGNOSIS — E669 Obesity, unspecified: Secondary | ICD-10-CM | POA: Diagnosis not present

## 2022-05-28 DIAGNOSIS — E1165 Type 2 diabetes mellitus with hyperglycemia: Secondary | ICD-10-CM | POA: Diagnosis not present

## 2022-05-28 DIAGNOSIS — Z6841 Body Mass Index (BMI) 40.0 and over, adult: Secondary | ICD-10-CM

## 2022-05-28 DIAGNOSIS — E559 Vitamin D deficiency, unspecified: Secondary | ICD-10-CM | POA: Diagnosis not present

## 2022-05-28 DIAGNOSIS — Z7985 Long-term (current) use of injectable non-insulin antidiabetic drugs: Secondary | ICD-10-CM

## 2022-05-28 MED ORDER — TIRZEPATIDE 5 MG/0.5ML ~~LOC~~ SOAJ
5.0000 mg | SUBCUTANEOUS | 0 refills | Status: DC
  Filled 2022-05-28: qty 2, 28d supply, fill #0

## 2022-05-28 MED ORDER — VITAMIN D (ERGOCALCIFEROL) 1.25 MG (50000 UNIT) PO CAPS
50000.0000 [IU] | ORAL_CAPSULE | ORAL | 0 refills | Status: DC
  Filled 2022-05-28: qty 4, 28d supply, fill #0

## 2022-05-28 NOTE — Progress Notes (Deleted)
Patient has a new grandbaby as of 2 days ago!  She is planning to go out to [facility] in [facility] March.  She also has an aunt that has been diagnosed with Stage IV lung cancer.  She is not hungry and is not eating much.  Feels like she is getting enough food and nutrition.  Breakfast is 2 eggs and a sausage patty or a breakfast bar and a protein shake.  Lunch is a [place] and cheese sandwich with a no sugar added jello cup or a PB&J with a little bag of pretzels.  Afternoon is string cheese or apple.  Dinner is chicken or ground beef patty or broccoli or green beans or mixed vegetables.  Snack calories is pretzels, string cheese or apple.

## 2022-06-07 ENCOUNTER — Other Ambulatory Visit (HOSPITAL_COMMUNITY): Payer: Self-pay

## 2022-06-07 MED ORDER — CYCLOBENZAPRINE HCL 10 MG PO TABS
10.0000 mg | ORAL_TABLET | Freq: Three times a day (TID) | ORAL | 0 refills | Status: DC
  Filled 2022-06-07: qty 90, 30d supply, fill #0

## 2022-06-11 NOTE — Progress Notes (Unsigned)
Chief Complaint:   OBESITY Barbara Trevino is here to discuss her progress with her obesity treatment plan along with follow-up of her obesity related diagnoses. [name] is on the Category 4 Plan and states she is following her eating plan approximately 50-60% of the time. [name] states she is not exercising.  Today's visit was #: 10 Starting weight: 296 LBS Starting date: 10/31/2021 Today's weight: 273 LBS Today's date: 05/28/2022 Total lbs lost to date: 23 LBS Total lbs lost since last in-office visit: 2 LBS  Interim History:  Patient has a new grandbaby as of 2 days ago!  She is planning to go out to [facility] in [facility] March.  She also has an aunt that has been diagnosed with Stage IV lung cancer.  She is not hungry and is not eating much.  Feels like she is getting enough food and nutrition.  Breakfast is 2 eggs and a sausage patty or a breakfast bar and a protein shake.  Lunch is a [place] and cheese sandwich with a no sugar added jello cup or a PB&J with a little bag of pretzels.  Afternoon is string cheese or apple.  Dinner is chicken or ground beef patty or broccoli or green beans or mixed vegetables.  Snack calories is pretzels, string cheese or apple.    Subjective:   1. Vitamin D deficiency Patient is on prescription vitamin D.  Patient denies nausea, vomiting, muscle weakness but positive for fatigue.  2. Type 2 diabetes mellitus with hyperglycemia, without long-term current use of insulin ([place]) Patient is on Mounjaro 5 mg weekly.  Patient denies GI side effects but significant satiety.  Assessment/Plan:   1. Vitamin D deficiency Refill- Vitamin D, Ergocalciferol, (DRISDOL) 1.25 MG (50000 UNIT) CAPS capsule; Take 1 capsule (50,000 Units total) by mouth every 7 (seven) days.  Dispense: 4 capsule; Refill: 0  2. Type 2 diabetes mellitus with hyperglycemia, without long-term current use of insulin (HCC) Refill- tirzepatide (MOUNJARO) 5 MG/0.5ML Pen; Inject 5 mg into the skin once a week.   Dispense: 2 mL; Refill: 0  3. BMI 40.0-44.9, adult ([place])  4. Obesity with starting BMI of 45.0 [name] is currently in the action stage of change. As such, her goal is to continue with weight loss efforts. She has agreed to the Category 4 Plan.   Exercise goals: All adults should avoid inactivity. Some physical activity is better than none, and adults who participate in any amount of physical activity gain some health benefits.  Behavioral modification strategies: increasing lean protein intake, meal planning and cooking strategies, keeping healthy foods in the home, and planning for success.  Bular has agreed to follow-up with our clinic in 3-4 weeks. She was informed of the importance of frequent follow-up visits to maximize her success with intensive lifestyle modifications for her multiple health conditions.   Objective:   Blood pressure 112/76, pulse 89, temperature 98 F (36.7 C), height '5\' 8"'$  (1.727 m), weight 273 lb (123.8 kg), SpO2 99 %. Body mass index is 41.51 kg/m.  General: Cooperative, alert, well developed, in no acute distress. HEENT: Conjunctivae and lids unremarkable. Cardiovascular: Regular rhythm.  Lungs: Normal work of breathing. Neurologic: No focal deficits.   Lab Results  Component Value Date   CREATININE 1.07 (H) 06/18/2018   BUN 12 06/18/2018   NA 139 06/18/2018   K 4.6 06/18/2018   CL 97 06/18/2018   CO2 26 06/18/2018   Lab Results  Component Value Date   ALT 40 (H)  06/18/2018   AST 34 06/18/2018   ALKPHOS 94 06/18/2018   BILITOT 0.3 06/18/2018   Lab Results  Component Value Date   HGBA1C 7.1 (H) 10/31/2021   HGBA1C 6.3 (H) 06/18/2018   Lab Results  Component Value Date   INSULIN 39.0 (H) 10/31/2021   INSULIN 32.2 (H) 06/18/2018   Lab Results  Component Value Date   TSH 1.200 10/31/2021   Lab Results  Component Value Date   CHOL 178 10/31/2021   HDL 64 10/31/2021   LDLCALC 77 10/31/2021   TRIG 226 (H) 10/31/2021   Lab Results   Component Value Date   VD25OH 34.8 10/31/2021   VD25OH 27.4 (L) 06/18/2018   VD25OH 59 01/04/2011   Lab Results  Component Value Date   WBC 5.5 10/31/2021   HGB 13.3 10/31/2021   HCT 39.6 10/31/2021   MCV 88 10/31/2021   PLT 230 10/31/2021   No results found for: "IRON", "TIBC", "FERRITIN"  Attestation Statements:   Reviewed by clinician on day of visit: allergies, medications, problem list, medical history, surgical history, family history, social history, and previous encounter notes.  I, Davy Pique, RMA, am acting as transcriptionist for Coralie Common, MD.  I have reviewed the above documentation for accuracy and completeness, and I agree with the above. - Coralie Common, MD

## 2022-06-12 NOTE — Progress Notes (Signed)
Erroneous encounter This encounter was created in error - please disregard. This encounter was created in error - please disregard.

## 2022-06-25 ENCOUNTER — Encounter (INDEPENDENT_AMBULATORY_CARE_PROVIDER_SITE_OTHER): Payer: Self-pay | Admitting: Family Medicine

## 2022-06-25 ENCOUNTER — Ambulatory Visit (INDEPENDENT_AMBULATORY_CARE_PROVIDER_SITE_OTHER): Payer: Commercial Managed Care - PPO | Admitting: Family Medicine

## 2022-06-25 ENCOUNTER — Other Ambulatory Visit (HOSPITAL_COMMUNITY): Payer: Self-pay

## 2022-06-25 VITALS — BP 150/88 | HR 99 | Temp 98.5°F | Ht 68.0 in | Wt 275.0 lb

## 2022-06-25 DIAGNOSIS — Z6841 Body Mass Index (BMI) 40.0 and over, adult: Secondary | ICD-10-CM | POA: Diagnosis not present

## 2022-06-25 DIAGNOSIS — E559 Vitamin D deficiency, unspecified: Secondary | ICD-10-CM | POA: Diagnosis not present

## 2022-06-25 DIAGNOSIS — E669 Obesity, unspecified: Secondary | ICD-10-CM | POA: Diagnosis not present

## 2022-06-25 DIAGNOSIS — Z7985 Long-term (current) use of injectable non-insulin antidiabetic drugs: Secondary | ICD-10-CM

## 2022-06-25 DIAGNOSIS — E1165 Type 2 diabetes mellitus with hyperglycemia: Secondary | ICD-10-CM

## 2022-06-25 MED ORDER — VITAMIN D (ERGOCALCIFEROL) 1.25 MG (50000 UNIT) PO CAPS
50000.0000 [IU] | ORAL_CAPSULE | ORAL | 0 refills | Status: DC
  Filled 2022-06-25: qty 4, 28d supply, fill #0

## 2022-06-25 MED ORDER — TIRZEPATIDE 5 MG/0.5ML ~~LOC~~ SOAJ
5.0000 mg | SUBCUTANEOUS | 0 refills | Status: DC
  Filled 2022-06-25: qty 2, 28d supply, fill #0

## 2022-06-25 NOTE — Progress Notes (Unsigned)
Chief Complaint:   OBESITY Barbara Trevino is here to discuss her progress with her obesity treatment plan along with follow-up of her obesity related diagnoses. Barbara Trevino is on the Category 4 Plan and states she is following her eating plan approximately 50% of the time. Barbara Trevino states she is walking 7500-8000 steps 7 times per week.  Today's visit was #: 73 Starting weight: 296 LBS Starting date: 10/31/2021 Today's weight: 275 LBS Today's date: 06/25/2022 Total lbs lost to date: 21 LBS Total lbs lost since last in-office visit: +2 LBS  Interim History: Patient just returned from [place] to see new grandbaby and really enjoyed herself. 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  Next few weeks she has Barbara Trevino that she will be celebrating with her family.    Subjective:   1. Type 2 diabetes mellitus with hyperglycemia, without long-term current use of insulin ([place]) Patient is on Mounjaro with no GI side effects noted.  Last A1c from August 2023, patient has appointment with PCP in June.  2. Vitamin D deficiency Patient was on prescription vitamin D.  Patient is positive for fatigue.  Assessment/Plan:   1. Type 2 diabetes mellitus with hyperglycemia, without long-term current use of insulin (HCC) Check labs today.  Refill- tirzepatide (MOUNJARO) 5 MG/0.5ML Pen; Inject 5 mg into the skin once a week.  Dispense: 2 mL; Refill: 0  - Comprehensive metabolic panel - Hemoglobin A1c - Insulin, random - Lipid Panel With LDL/HDL Ratio  2. Vitamin D deficiency Check labs today.  Refill- Vitamin D, Ergocalciferol, (DRISDOL) 1.25 MG (50000 UNIT) CAPS capsule; Take 1 capsule (50,000 Units total) by mouth every 7 (seven) days.  Dispense: 4 capsule; Refill: 0  - VITAMIN D 25 Hydroxy (Vit-D Deficiency, Fractures)  3. BMI 40.0-44.9, adult ([place])  4. Obesity with  starting BMI of 45.0 Barbara Trevino is currently in the action stage of change. As such, her goal is to continue with weight loss efforts. She has agreed to the Category 4 Plan.   Exercise goals: All adults should avoid inactivity. Some physical activity is better than none, and adults who participate in any amount of physical activity gain some health benefits.  Behavioral modification strategies: increasing lean protein intake, meal planning and cooking strategies, keeping healthy foods in the home, and planning for success.  Barbara Trevino has agreed to follow-up with our clinic in 3-4 weeks. She was informed of the importance of frequent follow-up visits to maximize her success with intensive lifestyle modifications for her multiple health conditions.   Objective:   Blood pressure (!) 150/88, pulse 99, temperature 98.5 F (36.9 C), height 5\' 8"  (1.727 m), weight 275 lb (124.7 kg), SpO2 100 %. Body mass index is 41.81 kg/m.  General: Cooperative, alert, well developed, in no acute distress. HEENT: Conjunctivae and lids unremarkable. Cardiovascular: Regular rhythm.  Lungs: Normal work of breathing. Neurologic: No focal deficits.   Lab Results  Component Value Date   CREATININE 1.07 (H) 06/18/2018   BUN 12 06/18/2018   NA 139 06/18/2018   K 4.6 06/18/2018   CL 97 06/18/2018   CO2 26 06/18/2018   Lab Results  Component Value Date   ALT 40 (H) 06/18/2018   AST 34 06/18/2018   ALKPHOS 94 06/18/2018   BILITOT 0.3 06/18/2018   Lab Results  Component Value Date  HGBA1C 7.1 (H) 10/31/2021   HGBA1C 6.3 (H) 06/18/2018   Lab Results  Component Value Date   INSULIN 39.0 (H) 10/31/2021   INSULIN 32.2 (H) 06/18/2018   Lab Results  Component Value Date   TSH 1.200 10/31/2021   Lab Results  Component Value Date   CHOL 178 10/31/2021   HDL 64 10/31/2021   LDLCALC 77 10/31/2021   TRIG 226 (H) 10/31/2021   Lab Results  Component Value Date   VD25OH 34.8 10/31/2021   VD25OH 27.4 (L)  06/18/2018   VD25OH 59 01/04/2011   Lab Results  Component Value Date   WBC 5.5 10/31/2021   HGB 13.3 10/31/2021   HCT 39.6 10/31/2021   MCV 88 10/31/2021   PLT 230 10/31/2021   No results found for: "IRON", "TIBC", "FERRITIN"  Attestation Statements:   Reviewed by clinician on day of visit: allergies, medications, problem list, medical history, surgical history, family history, social history, and previous encounter notes.  I, Davy Pique, RMA, am acting as transcriptionist for Coralie Common, MD. I have reviewed the above documentation for accuracy and completeness, and I agree with the above. - Coralie Common, MD

## 2022-06-26 ENCOUNTER — Other Ambulatory Visit (HOSPITAL_COMMUNITY): Payer: Self-pay

## 2022-06-26 MED ORDER — MIRABEGRON ER 50 MG PO TB24
50.0000 mg | ORAL_TABLET | Freq: Every day | ORAL | 3 refills | Status: DC
  Filled 2022-06-26: qty 90, 90d supply, fill #0
  Filled 2022-09-24: qty 90, 90d supply, fill #1
  Filled 2023-01-02: qty 90, 90d supply, fill #2
  Filled 2023-04-07: qty 90, 90d supply, fill #3

## 2022-06-30 ENCOUNTER — Telehealth: Payer: Commercial Managed Care - PPO | Admitting: Nurse Practitioner

## 2022-06-30 DIAGNOSIS — J069 Acute upper respiratory infection, unspecified: Secondary | ICD-10-CM | POA: Diagnosis not present

## 2022-06-30 MED ORDER — PREDNISONE 10 MG PO TABS: ORAL_TABLET | ORAL | 0 refills | Status: DC

## 2022-06-30 MED ORDER — PROMETHAZINE-DM 6.25-15 MG/5ML PO SYRP: 5.0000 mL | ORAL_SOLUTION | Freq: Four times a day (QID) | ORAL | 0 refills | Status: DC | PRN

## 2022-06-30 NOTE — Progress Notes (Signed)
E-Visit for Cough  We are sorry that you are not feeling well.  Here is how we plan to help!  Based on your presentation I believe you most likely have A cough due to a virus.  This is called viral bronchitis and is best treated by rest, plenty of fluids and control of the cough.  You may use Ibuprofen or Tylenol as directed to help your symptoms.     I have sent a prescription cough syrup and prednisone.   Prednisone 10 mg daily for 6 days (see taper instructions below)  Directions for 6 day taper: Day 1: 2 tablets before breakfast, 1 after both lunch & dinner and 2 at bedtime Day 2: 1 tab before breakfast, 1 after both lunch & dinner and 2 at bedtime Day 3: 1 tab at each meal & 1 at bedtime Day 4: 1 tab at breakfast, 1 at lunch, 1 at bedtime Day 5: 1 tab at breakfast & 1 tab at bedtime Day 6: 1 tab at breakfast  From your responses in the eVisit questionnaire you describe inflammation in the upper respiratory tract which is causing a significant cough.  This is commonly called Bronchitis and has four common causes:   Allergies Viral Infections Acid Reflux Bacterial Infection Allergies, viruses and acid reflux are treated by controlling symptoms or eliminating the cause. An example might be a cough caused by taking certain blood pressure medications. You stop the cough by changing the medication. Another example might be a cough caused by acid reflux. Controlling the reflux helps control the cough.  USE OF BRONCHODILATOR ("RESCUE") INHALERS: There is a risk from using your bronchodilator too frequently.  The risk is that over-reliance on a medication which only relaxes the muscles surrounding the breathing tubes can reduce the effectiveness of medications prescribed to reduce swelling and congestion of the tubes themselves.  Although you feel brief relief from the bronchodilator inhaler, your asthma may actually be worsening with the tubes becoming more swollen and filled with mucus.  This  can delay other crucial treatments, such as oral steroid medications. If you need to use a bronchodilator inhaler daily, several times per day, you should discuss this with your provider.  There are probably better treatments that could be used to keep your asthma under control.     HOME CARE Only take medications as instructed by your medical team. Complete the entire course of an antibiotic. Drink plenty of fluids and get plenty of rest. Avoid close contacts especially the very young and the elderly Cover your mouth if you cough or cough into your sleeve. Always remember to wash your hands A steam or ultrasonic humidifier can help congestion.   GET HELP RIGHT AWAY IF: You develop worsening fever. You become short of breath You cough up blood. Your symptoms persist after you have completed your treatment plan MAKE SURE YOU  Understand these instructions. Will watch your condition. Will get help right away if you are not doing well or get worse.    Thank you for choosing an e-visit.  Your e-visit answers were reviewed by a board certified advanced clinical practitioner to complete your personal care plan. Depending upon the condition, your plan could have included both over the counter or prescription medications.  Please review your pharmacy choice. Make sure the pharmacy is open so you can pick up prescription now. If there is a problem, you may contact your provider through [employer] and have the prescription routed to another pharmacy.  Your safety is important to [place]. If you have drug allergies check your prescription carefully.   For the next 24 hours you can use MyChart to ask questions about today's visit, request a non-urgent call back, or ask for a work or school excuse. You will get an email in the next two days asking about your experience. I hope that your e-visit has been valuable and will speed your recovery.

## 2022-06-30 NOTE — Progress Notes (Signed)
I have spent 5 minutes in review of e-visit questionnaire, review and updating patient chart, medical decision making and response to patient.  ° °Marshaun Lortie W Jc Veron, NP ° °  °

## 2022-07-02 ENCOUNTER — Encounter (INDEPENDENT_AMBULATORY_CARE_PROVIDER_SITE_OTHER): Payer: Self-pay | Admitting: Family Medicine

## 2022-07-17 DIAGNOSIS — E559 Vitamin D deficiency, unspecified: Secondary | ICD-10-CM | POA: Diagnosis not present

## 2022-07-17 DIAGNOSIS — E1165 Type 2 diabetes mellitus with hyperglycemia: Secondary | ICD-10-CM | POA: Diagnosis not present

## 2022-07-23 ENCOUNTER — Ambulatory Visit (INDEPENDENT_AMBULATORY_CARE_PROVIDER_SITE_OTHER): Payer: Commercial Managed Care - PPO | Admitting: Family Medicine

## 2022-07-23 ENCOUNTER — Other Ambulatory Visit (HOSPITAL_COMMUNITY): Payer: Self-pay

## 2022-07-23 ENCOUNTER — Encounter (INDEPENDENT_AMBULATORY_CARE_PROVIDER_SITE_OTHER): Payer: Self-pay | Admitting: Family Medicine

## 2022-07-23 VITALS — BP 124/76 | HR 87 | Ht 68.0 in | Wt 275.0 lb

## 2022-07-23 DIAGNOSIS — Z6841 Body Mass Index (BMI) 40.0 and over, adult: Secondary | ICD-10-CM

## 2022-07-23 DIAGNOSIS — E559 Vitamin D deficiency, unspecified: Secondary | ICD-10-CM

## 2022-07-23 DIAGNOSIS — Z7985 Long-term (current) use of injectable non-insulin antidiabetic drugs: Secondary | ICD-10-CM

## 2022-07-23 DIAGNOSIS — E1165 Type 2 diabetes mellitus with hyperglycemia: Secondary | ICD-10-CM | POA: Diagnosis not present

## 2022-07-23 DIAGNOSIS — E669 Obesity, unspecified: Secondary | ICD-10-CM | POA: Diagnosis not present

## 2022-07-23 MED ORDER — VITAMIN D (ERGOCALCIFEROL) 1.25 MG (50000 UNIT) PO CAPS
50000.0000 [IU] | ORAL_CAPSULE | ORAL | 0 refills | Status: DC
Start: 2022-07-23 — End: 2022-08-13
  Filled 2022-07-23: qty 4, 28d supply, fill #0

## 2022-07-23 MED ORDER — TIRZEPATIDE 5 MG/0.5ML ~~LOC~~ SOAJ
5.0000 mg | SUBCUTANEOUS | 0 refills | Status: DC
Start: 2022-07-23 — End: 2022-07-30
  Filled 2022-07-23: qty 2, 28d supply, fill #0

## 2022-07-23 NOTE — Progress Notes (Signed)
Chief Complaint:   OBESITY Barbara Trevino is here to discuss her progress with her obesity treatment plan along with follow-up of her obesity related diagnoses. Barbara Trevino is on the Category 4 Plan and states she is following her eating plan approximately 50% of the time. Barbara Trevino states she is to not exercising.  Today's visit was #: 12 Starting weight: 296 LBS Starting date: 10/31/2021 (restart) Today's weight: 275 LBS Today's date: 07/23/2022 Total lbs lost to date: 21 LBS Total lbs lost since last in-office visit: 0  Interim History: Patient got bronchitis over Easter and did a round of prednisone and phenergan cough syrup.  She is finally starting to feel better.  She knew she hadn't done so well on her plan because she hadn't felt well. She is wondering about whether or not she should increase her Mounjaro as she is no longer feeling as full.   She got her labs done when she was still on prednisone.  Over the next few weeks she wants to try to get back on her plan.   Subjective:   1. Vitamin D deficiency Patient is on prescription vitamin D.  Patient last vitamin D level was 45.6.  2. Type 2 diabetes mellitus with hyperglycemia, without long-term current use of insulin Patient is on Mounjaro 5 mg subcu weekly.  Patient's recent A1c was 6.1.  Assessment/Plan:   1. Vitamin D deficiency Refill- Vitamin D, Ergocalciferol, (DRISDOL) 1.25 MG (50000 UNIT) CAPS capsule; Take 1 capsule (50,000 Units total) by mouth every 7 (seven) days.  Dispense: 4 capsule; Refill: 0  2. Type 2 diabetes mellitus with hyperglycemia, without long-term current use of insulin Refill- tirzepatide (MOUNJARO) 5 MG/0.5ML Pen; Inject 5 mg into the skin once a week.  Dispense: 2 mL; Refill: 0  3. BMI 40.0-44.9, adult  4. Obesity with starting BMI of 45.0 Barbara Trevino is currently in the action stage of change. As such, her goal is to continue with weight loss efforts. She has agreed to the Category 4 Plan.   Exercise goals:  All adults should avoid inactivity. Some physical activity is better than none, and adults who participate in any amount of physical activity gain some health benefits.  Behavioral modification strategies: increasing lean protein intake, meal planning and cooking strategies, keeping healthy foods in the home, and planning for success.  Barbara Trevino has agreed to follow-up with our clinic in 3 weeks. She was informed of the importance of frequent follow-up visits to maximize her success with intensive lifestyle modifications for her multiple health conditions.   Objective:   Blood pressure 124/76, pulse 87, height  (1.727 m), weight 275 lb (124.7 kg), SpO2 99 %. Body mass index is 41.81 kg/m.  General: Cooperative, alert, well developed, in no acute distress. HEENT: Conjunctivae and lids unremarkable. Cardiovascular: Regular rhythm.  Lungs: Normal work of breathing. Neurologic: No focal deficits.   Lab Results  Component Value Date   CREATININE 1.15 (H) 07/17/2022   BUN 13 07/17/2022   NA 138 07/17/2022   K 5.2 07/17/2022   CL 102 07/17/2022   CO2 21 07/17/2022   Lab Results  Component Value Date   ALT 24 07/17/2022   AST 19 07/17/2022   ALKPHOS 87 07/17/2022   BILITOT 0.3 07/17/2022   Lab Results  Component Value Date   HGBA1C 6.1 (H) 07/17/2022   HGBA1C 7.1 (H) 10/31/2021   HGBA1C 6.3 (H) 06/18/2018   Lab Results  Component Value Date   INSULIN 38.0 (H) 07/17/2022  INSULIN 39.0 (H) 10/31/2021   INSULIN 32.2 (H) 06/18/2018   Lab Results  Component Value Date   TSH 1.200 10/31/2021   Lab Results  Component Value Date   CHOL 157 07/17/2022   HDL 64 07/17/2022   LDLCALC 70 07/17/2022   TRIG 130 07/17/2022   Lab Results  Component Value Date   VD25OH 45.6 07/17/2022   VD25OH 34.8 10/31/2021   VD25OH 27.4 (L) 06/18/2018   Lab Results  Component Value Date   WBC 5.5 10/31/2021   HGB 13.3 10/31/2021   HCT 39.6 10/31/2021   MCV 88 10/31/2021   PLT 230  10/31/2021   No results found for: "IRON", "TIBC", "FERRITIN"  Attestation Statements:   Reviewed by clinician on day of visit: allergies, medications, problem list, medical history, surgical history, family history, social history, and previous encounter notes.  I, Malcolm Metro, RMA, am acting as transcriptionist for Reuben Likes, MD.  I have reviewed the above documentation for accuracy and completeness, and I agree with the above. - Reuben Likes, MD

## 2022-07-24 ENCOUNTER — Other Ambulatory Visit (HOSPITAL_COMMUNITY): Payer: Self-pay

## 2022-07-24 ENCOUNTER — Telehealth (INDEPENDENT_AMBULATORY_CARE_PROVIDER_SITE_OTHER): Payer: Self-pay | Admitting: Family Medicine

## 2022-07-24 ENCOUNTER — Other Ambulatory Visit: Payer: Self-pay

## 2022-07-24 LAB — COMPREHENSIVE METABOLIC PANEL
ALT: 24 IU/L (ref 0–32)
AST: 19 IU/L (ref 0–40)
Albumin/Globulin Ratio: 1.7 (ref 1.2–2.2)
Albumin: 4.1 g/dL (ref 3.8–4.9)
Alkaline Phosphatase: 87 IU/L (ref 44–121)
BUN/Creatinine Ratio: 11 — ABNORMAL LOW (ref 12–28)
BUN: 13 mg/dL (ref 8–27)
Bilirubin Total: 0.3 mg/dL (ref 0.0–1.2)
CO2: 21 mmol/L (ref 20–29)
Calcium: 9.4 mg/dL (ref 8.7–10.3)
Chloride: 102 mmol/L (ref 96–106)
Creatinine, Ser: 1.15 mg/dL — ABNORMAL HIGH (ref 0.57–1.00)
Globulin, Total: 2.4 g/dL (ref 1.5–4.5)
Glucose: 124 mg/dL — ABNORMAL HIGH (ref 70–99)
Potassium: 5.2 mmol/L (ref 3.5–5.2)
Sodium: 138 mmol/L (ref 134–144)
Total Protein: 6.5 g/dL (ref 6.0–8.5)
eGFR: 55 mL/min/{1.73_m2} — ABNORMAL LOW (ref >59)

## 2022-07-24 LAB — LIPID PANEL WITH LDL/HDL RATIO
Cholesterol, Total: 157 mg/dL (ref 100–199)
HDL: 64 mg/dL (ref >39)
LDL Chol Calc (NIH): 70 mg/dL (ref 0–99)
LDL/HDL Ratio: 1.1 ratio (ref 0.0–3.2)
Triglycerides: 130 mg/dL (ref 0–149)
VLDL Cholesterol Cal: 23 mg/dL (ref 5–40)

## 2022-07-24 LAB — VITAMIN D 25 HYDROXY (VIT D DEFICIENCY, FRACTURES): Vit D, 25-Hydroxy: 45.6 ng/mL (ref 30.0–100.0)

## 2022-07-24 LAB — HEMOGLOBIN A1C
Est. average glucose Bld gHb Est-mCnc: 128 mg/dL
Hgb A1c MFr Bld: 6.1 % — ABNORMAL HIGH (ref 4.8–5.6)

## 2022-07-24 LAB — INSULIN, RANDOM: INSULIN: 38 u[IU]/mL — ABNORMAL HIGH (ref 2.6–24.9)

## 2022-07-24 NOTE — Telephone Encounter (Signed)
Please advise, Pt has DM Type II started Mounjaro 2.5mg  11/14/21,  02/05/22

## 2022-07-24 NOTE — Telephone Encounter (Signed)
4/23 Patient stated that her pharmancy doesn't have Monjuro. Patient is looking for advice on where to ge the medication. [facility] stated that [facility] the monjuro 2.5 and  but doesnt have the . JE

## 2022-07-25 ENCOUNTER — Telehealth (INDEPENDENT_AMBULATORY_CARE_PROVIDER_SITE_OTHER): Payer: Self-pay

## 2022-07-25 ENCOUNTER — Encounter (INDEPENDENT_AMBULATORY_CARE_PROVIDER_SITE_OTHER): Payer: Self-pay

## 2022-07-25 NOTE — Telephone Encounter (Signed)
Replied to patients message regarding not being able to get her Greggory Keen at her pharmacy. I asked the patient to call around to other pharmacies and if she found it they could just transfer her prescription. Thanks

## 2022-07-26 ENCOUNTER — Other Ambulatory Visit (HOSPITAL_COMMUNITY): Payer: Self-pay

## 2022-07-27 ENCOUNTER — Other Ambulatory Visit (HOSPITAL_COMMUNITY): Payer: Self-pay

## 2022-07-27 MED ORDER — CYCLOBENZAPRINE HCL 10 MG PO TABS
10.0000 mg | ORAL_TABLET | Freq: Three times a day (TID) | ORAL | 1 refills | Status: DC
  Filled 2022-07-27: qty 90, 30d supply, fill #0
  Filled 2022-08-22: qty 90, 30d supply, fill #1

## 2022-07-30 ENCOUNTER — Encounter (INDEPENDENT_AMBULATORY_CARE_PROVIDER_SITE_OTHER): Payer: Self-pay

## 2022-07-30 ENCOUNTER — Other Ambulatory Visit (INDEPENDENT_AMBULATORY_CARE_PROVIDER_SITE_OTHER): Payer: Self-pay | Admitting: Family Medicine

## 2022-07-30 ENCOUNTER — Other Ambulatory Visit (HOSPITAL_COMMUNITY): Payer: Self-pay

## 2022-07-30 ENCOUNTER — Other Ambulatory Visit: Payer: Self-pay

## 2022-07-30 DIAGNOSIS — E1165 Type 2 diabetes mellitus with hyperglycemia: Secondary | ICD-10-CM

## 2022-07-30 MED ORDER — TIRZEPATIDE 7.5 MG/0.5ML ~~LOC~~ SOAJ
7.5000 mg | SUBCUTANEOUS | 0 refills | Status: DC
Start: 2022-07-30 — End: 2022-08-13
  Filled 2022-07-30 (×3): qty 2, 28d supply, fill #0

## 2022-08-13 ENCOUNTER — Encounter (INDEPENDENT_AMBULATORY_CARE_PROVIDER_SITE_OTHER): Payer: Self-pay | Admitting: Family Medicine

## 2022-08-13 ENCOUNTER — Other Ambulatory Visit (HOSPITAL_COMMUNITY): Payer: Self-pay

## 2022-08-13 ENCOUNTER — Ambulatory Visit (INDEPENDENT_AMBULATORY_CARE_PROVIDER_SITE_OTHER): Payer: Commercial Managed Care - PPO | Admitting: Family Medicine

## 2022-08-13 VITALS — BP 115/75 | HR 79 | Ht 68.0 in | Wt 277.0 lb

## 2022-08-13 DIAGNOSIS — Z6841 Body Mass Index (BMI) 40.0 and over, adult: Secondary | ICD-10-CM | POA: Diagnosis not present

## 2022-08-13 DIAGNOSIS — Z7985 Long-term (current) use of injectable non-insulin antidiabetic drugs: Secondary | ICD-10-CM | POA: Diagnosis not present

## 2022-08-13 DIAGNOSIS — E1165 Type 2 diabetes mellitus with hyperglycemia: Secondary | ICD-10-CM

## 2022-08-13 DIAGNOSIS — E669 Obesity, unspecified: Secondary | ICD-10-CM

## 2022-08-13 DIAGNOSIS — E559 Vitamin D deficiency, unspecified: Secondary | ICD-10-CM

## 2022-08-13 MED ORDER — VITAMIN D (ERGOCALCIFEROL) 1.25 MG (50000 UNIT) PO CAPS
50000.0000 [IU] | ORAL_CAPSULE | ORAL | 0 refills | Status: DC
Start: 2022-08-13 — End: 2022-09-11
  Filled 2022-08-13: qty 4, 28d supply, fill #0

## 2022-08-13 MED ORDER — TIRZEPATIDE 7.5 MG/0.5ML ~~LOC~~ SOAJ
7.5000 mg | SUBCUTANEOUS | 0 refills | Status: DC
Start: 2022-08-13 — End: 2022-09-11
  Filled 2022-08-13 – 2022-08-22 (×2): qty 2, 28d supply, fill #0

## 2022-08-13 NOTE — Progress Notes (Signed)
Chief Complaint:   OBESITY Barbara Trevino is here to discuss her progress with her obesity treatment plan along with follow-up of her obesity related diagnoses. Barbara Trevino is on the Category 4 Plan and states she is following her eating plan approximately 75-80% of the time. Barbara Trevino states she is walking for 15-20 minutes 3-4 times per week.  Today's visit was #: 13 Starting weight: 296 lbs Starting date: 10/31/2021 Today's weight: 277 lbs Today's date: 08/13/2022 Total lbs lost to date: 19 Total lbs lost since last in-office visit: 0  Interim History: Patient almost canceled her appointment today because her mother was constipated and had a significant loss of bowel function. She has done better on the meal plan over the last few weeks.  She mentioned she did have a few extra indulgences over the last few weeks .  Some good days of exercise. Had a cookout for Mother's Day.   Subjective:   1. Vitamin D deficiency Barbara Trevino is on prescription vitamin D.  She denies nausea, vomiting, or muscle weakness but she notes fatigue.  2. Type 2 diabetes mellitus with hyperglycemia, without long-term current use of insulin (HCC) Barbara Trevino is on Mounjaro 7.5 mg subq. weekly (took her weeks to get her prescription due to decreased supply).   Assessment/Plan:   1. Vitamin D deficiency We will refill prescription vitamin D for 1 month.  - Vitamin D, Ergocalciferol, (DRISDOL) 1.25 MG (50000 UNIT) CAPS capsule; Take 1 capsule (50,000 Units total) by mouth every 7 (seven) days.  Dispense: 4 capsule; Refill: 0  2. Type 2 diabetes mellitus with hyperglycemia, without long-term current use of insulin (HCC) We will refill Mounjaro at 7.5 mg for 1 month.  - tirzepatide (MOUNJARO) 7.5 MG/0.5ML Pen; Inject 7.5 mg into the skin once a week.  Dispense: 2 mL; Refill: 0  3. BMI 40.0-44.9, adult (HCC)  4. Obesity with starting BMI of 45.0 Barbara Trevino is currently in the action stage of change. As such, her goal is to continue with  weight loss efforts. She has agreed to the Category 4 Plan.   Exercise goals: All adults should avoid inactivity. Some physical activity is better than none, and adults who participate in any amount of physical activity gain some health benefits. Sofa exercises handout was given. Instructed the patient on how to engage muscles before eliciting movement.   Behavioral modification strategies: increasing lean protein intake, meal planning and cooking strategies, keeping healthy foods in the home, and planning for success.  Barbara Trevino has agreed to follow-up with our clinic in 4 weeks. She was informed of the importance of frequent follow-up visits to maximize her success with intensive lifestyle modifications for her multiple health conditions.   Objective:   Blood pressure 115/75, pulse 79, height 5\' 8"  (1.727 m), weight 277 lb (125.6 kg), SpO2 100 %. Body mass index is 42.12 kg/m.  General: Cooperative, alert, well developed, in no acute distress. HEENT: Conjunctivae and lids unremarkable. Cardiovascular: Regular rhythm.  Lungs: Normal work of breathing. Neurologic: No focal deficits.   Lab Results  Component Value Date   CREATININE 1.15 (H) 07/17/2022   BUN 13 07/17/2022   NA 138 07/17/2022   K 5.2 07/17/2022   CL 102 07/17/2022   CO2 21 07/17/2022   Lab Results  Component Value Date   ALT 24 07/17/2022   AST 19 07/17/2022   ALKPHOS 87 07/17/2022   BILITOT 0.3 07/17/2022   Lab Results  Component Value Date   HGBA1C 6.1 (H) 07/17/2022  HGBA1C 7.1 (H) 10/31/2021   HGBA1C 6.3 (H) 06/18/2018   Lab Results  Component Value Date   INSULIN 38.0 (H) 07/17/2022   INSULIN 39.0 (H) 10/31/2021   INSULIN 32.2 (H) 06/18/2018   Lab Results  Component Value Date   TSH 1.200 10/31/2021   Lab Results  Component Value Date   CHOL 157 07/17/2022   HDL 64 07/17/2022   LDLCALC 70 07/17/2022   TRIG 130 07/17/2022   Lab Results  Component Value Date   VD25OH 45.6 07/17/2022   VD25OH  34.8 10/31/2021   VD25OH 27.4 (L) 06/18/2018   Lab Results  Component Value Date   WBC 5.5 10/31/2021   HGB 13.3 10/31/2021   HCT 39.6 10/31/2021   MCV 88 10/31/2021   PLT 230 10/31/2021   No results found for: "IRON", "TIBC", "FERRITIN"  Attestation Statements:   Reviewed by clinician on day of visit: allergies, medications, problem list, medical history, surgical history, family history, social history, and previous encounter notes.   I, Burt Knack, am acting as transcriptionist for Reuben Likes, MD.  I have reviewed the above documentation for accuracy and completeness, and I agree with the above. - Reuben Likes, MD

## 2022-08-22 ENCOUNTER — Other Ambulatory Visit (HOSPITAL_COMMUNITY): Payer: Self-pay

## 2022-08-23 ENCOUNTER — Other Ambulatory Visit (HOSPITAL_COMMUNITY): Payer: Self-pay

## 2022-08-23 MED ORDER — MELOXICAM 15 MG PO TABS
15.0000 mg | ORAL_TABLET | Freq: Every day | ORAL | 3 refills | Status: DC
  Filled 2022-08-23 – 2022-09-24 (×2): qty 90, 90d supply, fill #0
  Filled 2023-01-02: qty 90, 90d supply, fill #1
  Filled 2023-04-07: qty 90, 90d supply, fill #2
  Filled 2023-07-16: qty 90, 90d supply, fill #3

## 2022-09-07 ENCOUNTER — Other Ambulatory Visit (HOSPITAL_COMMUNITY): Payer: Self-pay

## 2022-09-07 DIAGNOSIS — G620 Drug-induced polyneuropathy: Secondary | ICD-10-CM | POA: Diagnosis not present

## 2022-09-07 DIAGNOSIS — E782 Mixed hyperlipidemia: Secondary | ICD-10-CM | POA: Diagnosis not present

## 2022-09-07 DIAGNOSIS — R609 Edema, unspecified: Secondary | ICD-10-CM | POA: Diagnosis not present

## 2022-09-07 DIAGNOSIS — M1990 Unspecified osteoarthritis, unspecified site: Secondary | ICD-10-CM | POA: Diagnosis not present

## 2022-09-07 DIAGNOSIS — E1169 Type 2 diabetes mellitus with other specified complication: Secondary | ICD-10-CM | POA: Diagnosis not present

## 2022-09-07 DIAGNOSIS — K219 Gastro-esophageal reflux disease without esophagitis: Secondary | ICD-10-CM | POA: Diagnosis not present

## 2022-09-07 DIAGNOSIS — B009 Herpesviral infection, unspecified: Secondary | ICD-10-CM | POA: Diagnosis not present

## 2022-09-07 DIAGNOSIS — E559 Vitamin D deficiency, unspecified: Secondary | ICD-10-CM | POA: Diagnosis not present

## 2022-09-07 DIAGNOSIS — K760 Fatty (change of) liver, not elsewhere classified: Secondary | ICD-10-CM | POA: Diagnosis not present

## 2022-09-07 MED ORDER — GABAPENTIN 300 MG PO CAPS
600.0000 mg | ORAL_CAPSULE | Freq: Every evening | ORAL | 2 refills | Status: DC
  Filled 2022-09-07 – 2022-09-27 (×2): qty 180, 90d supply, fill #0
  Filled 2023-01-02: qty 180, 90d supply, fill #1
  Filled 2023-04-07: qty 180, 90d supply, fill #2

## 2022-09-07 MED ORDER — AMITRIPTYLINE HCL 25 MG PO TABS
25.0000 mg | ORAL_TABLET | Freq: Every day | ORAL | 2 refills | Status: DC
  Filled 2022-09-07 – 2022-10-04 (×2): qty 90, 45d supply, fill #0
  Filled 2022-11-21: qty 90, 45d supply, fill #1
  Filled 2023-01-02: qty 90, 45d supply, fill #2

## 2022-09-07 MED ORDER — VENLAFAXINE HCL ER 150 MG PO CP24
150.0000 mg | ORAL_CAPSULE | Freq: Every day | ORAL | 2 refills | Status: AC
  Filled 2022-09-07 – 2022-11-14 (×2): qty 90, 90d supply, fill #0
  Filled 2023-02-10: qty 90, 90d supply, fill #1
  Filled 2023-05-15: qty 90, 90d supply, fill #2

## 2022-09-07 MED ORDER — PANTOPRAZOLE SODIUM 40 MG PO TBEC
40.0000 mg | DELAYED_RELEASE_TABLET | Freq: Every day | ORAL | 2 refills | Status: DC
  Filled 2022-09-07 – 2023-01-02 (×2): qty 90, 90d supply, fill #0
  Filled 2023-04-07: qty 90, 90d supply, fill #1
  Filled 2023-07-16: qty 90, 90d supply, fill #2

## 2022-09-11 ENCOUNTER — Other Ambulatory Visit (HOSPITAL_COMMUNITY): Payer: Self-pay

## 2022-09-11 ENCOUNTER — Ambulatory Visit (INDEPENDENT_AMBULATORY_CARE_PROVIDER_SITE_OTHER): Payer: Commercial Managed Care - PPO | Admitting: Family Medicine

## 2022-09-11 ENCOUNTER — Encounter (INDEPENDENT_AMBULATORY_CARE_PROVIDER_SITE_OTHER): Payer: Self-pay | Admitting: Family Medicine

## 2022-09-11 VITALS — BP 109/72 | HR 86 | Temp 98.3°F | Ht 68.0 in | Wt 276.0 lb

## 2022-09-11 DIAGNOSIS — Z7985 Long-term (current) use of injectable non-insulin antidiabetic drugs: Secondary | ICD-10-CM

## 2022-09-11 DIAGNOSIS — E559 Vitamin D deficiency, unspecified: Secondary | ICD-10-CM | POA: Diagnosis not present

## 2022-09-11 DIAGNOSIS — E1165 Type 2 diabetes mellitus with hyperglycemia: Secondary | ICD-10-CM | POA: Diagnosis not present

## 2022-09-11 DIAGNOSIS — E669 Obesity, unspecified: Secondary | ICD-10-CM | POA: Diagnosis not present

## 2022-09-11 DIAGNOSIS — Z6841 Body Mass Index (BMI) 40.0 and over, adult: Secondary | ICD-10-CM | POA: Diagnosis not present

## 2022-09-11 MED ORDER — TIRZEPATIDE 7.5 MG/0.5ML ~~LOC~~ SOAJ
7.5000 mg | SUBCUTANEOUS | 0 refills | Status: DC
Start: 2022-09-11 — End: 2022-10-15
  Filled 2022-09-11 – 2022-09-14 (×3): qty 2, 28d supply, fill #0

## 2022-09-11 MED ORDER — VITAMIN D (ERGOCALCIFEROL) 1.25 MG (50000 UNIT) PO CAPS
50000.0000 [IU] | ORAL_CAPSULE | ORAL | 0 refills | Status: DC
Start: 2022-09-11 — End: 2022-10-15
  Filled 2022-09-11: qty 4, 28d supply, fill #0

## 2022-09-11 NOTE — Progress Notes (Signed)
Chief Complaint:   OBESITY Barbara Trevino is here to discuss her progress with her obesity treatment plan along with follow-up of her obesity related diagnoses. Barbara Trevino is on the Category 4 Plan and states she is following her eating plan approximately 50% of the time. Barbara Trevino states she is doing couch exercises for 15 minutes 5-6 times per week.  Today's visit was #: 14 Starting weight: 296 lbs Starting date: 10/31/2021 Today's weight: 276 lbs Today's date: 09/11/2022 Total lbs lost to date: 20 Total lbs lost since last in-office visit: 1  Interim History: Patient had a good Memorial Day and had a cookout.  Her son and granddaughter are visiting currently from [place].  She did some of her couch exercises over the last month.  She is planning a trip to [facility] in the next few weeks.  She saw her PCP and he was pleased with the progress she has had. She is really struggling with getting all food in.   Subjective:   1. Vitamin D deficiency Patient denies nausea, vomiting, or muscle weakness but notes fatigue.  2. Type 2 diabetes mellitus with hyperglycemia, without long-term current use of insulin (HCC) Patient is on Mounjaro 7.5 mg.  Recent improvement of A1c from 7.1-6.1.  Assessment/Plan:   1. Vitamin D deficiency Patient will continue prescription vitamin D, and we will refill for 1 month.  - Vitamin D, Ergocalciferol, (DRISDOL) 1.25 MG (50000 UNIT) CAPS capsule; Take 1 capsule (50,000 Units total) by mouth every 7 (seven) days.  Dispense: 4 capsule; Refill: 0  2. Type 2 diabetes mellitus with hyperglycemia, without long-term current use of insulin (HCC) Patient will continue Mounjaro at 7.5 mg, and we will refill for 1 month.  - tirzepatide (MOUNJARO) 7.5 MG/0.5ML Pen; Inject 7.5 mg into the skin once a week.  Dispense: 2 mL; Refill: 0  3. BMI 40.0-44.9, adult (HCC)  4. Obesity with starting BMI of 45.0 Barbara Trevino is currently in the action stage of change. As such, her goal is  to continue with weight loss efforts. She has agreed to the [employer] + 300 calories.   Exercise goals: All adults should avoid inactivity. Some physical activity is better than none, and adults who participate in any amount of physical activity gain some health benefits.  Behavioral modification strategies: increasing lean protein intake, meal planning and cooking strategies, keeping healthy foods in the home, and planning for success.  Barbara Trevino has agreed to follow-up with our clinic in 4 weeks. She was informed of the importance of frequent follow-up visits to maximize her success with intensive lifestyle modifications for her multiple health conditions.   Objective:   Blood pressure 109/72, pulse 86, temperature 98.3 F (36.8 C), height 5\' 8"  (1.727 m), weight 276 lb (125.2 kg), SpO2 97 %. Body mass index is 41.97 kg/m.  General: Cooperative, alert, well developed, in no acute distress. HEENT: Conjunctivae and lids unremarkable. Cardiovascular: Regular rhythm.  Lungs: Normal work of breathing. Neurologic: No focal deficits.   Lab Results  Component Value Date   CREATININE 1.15 (H) 07/17/2022   BUN 13 07/17/2022   NA 138 07/17/2022   K 5.2 07/17/2022   CL 102 07/17/2022   CO2 21 07/17/2022   Lab Results  Component Value Date   ALT 24 07/17/2022   AST 19 07/17/2022   ALKPHOS 87 07/17/2022   BILITOT 0.3 07/17/2022   Lab Results  Component Value Date   HGBA1C 6.1 (H) 07/17/2022   HGBA1C 7.1 (H)  10/31/2021   HGBA1C 6.3 (H) 06/18/2018   Lab Results  Component Value Date   INSULIN 38.0 (H) 07/17/2022   INSULIN 39.0 (H) 10/31/2021   INSULIN 32.2 (H) 06/18/2018   Lab Results  Component Value Date   TSH 1.200 10/31/2021   Lab Results  Component Value Date   CHOL 157 07/17/2022   HDL 64 07/17/2022   LDLCALC 70 07/17/2022   TRIG 130 07/17/2022   Lab Results  Component Value Date   VD25OH 45.6 07/17/2022   VD25OH 34.8 10/31/2021   VD25OH 27.4 (L) 06/18/2018    Lab Results  Component Value Date   WBC 5.5 10/31/2021   HGB 13.3 10/31/2021   HCT 39.6 10/31/2021   MCV 88 10/31/2021   PLT 230 10/31/2021   No results found for: "IRON", "TIBC", "FERRITIN"  Attestation Statements:   Reviewed by clinician on day of visit: allergies, medications, problem list, medical history, surgical history, family history, social history, and previous encounter notes.   I, Burt Knack, am acting as transcriptionist for Reuben Likes, MD. I have reviewed the above documentation for accuracy and completeness, and I agree with the above. - Reuben Likes, MD

## 2022-09-13 ENCOUNTER — Other Ambulatory Visit (HOSPITAL_COMMUNITY): Payer: Self-pay

## 2022-09-14 ENCOUNTER — Other Ambulatory Visit (HOSPITAL_COMMUNITY): Payer: Self-pay

## 2022-09-25 ENCOUNTER — Other Ambulatory Visit: Payer: Self-pay

## 2022-09-25 ENCOUNTER — Other Ambulatory Visit (HOSPITAL_COMMUNITY): Payer: Self-pay

## 2022-09-26 ENCOUNTER — Other Ambulatory Visit (HOSPITAL_COMMUNITY): Payer: Self-pay

## 2022-09-27 ENCOUNTER — Other Ambulatory Visit (HOSPITAL_COMMUNITY): Payer: Self-pay

## 2022-09-27 ENCOUNTER — Other Ambulatory Visit: Payer: Self-pay

## 2022-10-05 ENCOUNTER — Other Ambulatory Visit (HOSPITAL_COMMUNITY): Payer: Self-pay

## 2022-10-05 ENCOUNTER — Other Ambulatory Visit: Payer: Self-pay

## 2022-10-11 DIAGNOSIS — M25511 Pain in right shoulder: Secondary | ICD-10-CM | POA: Diagnosis not present

## 2022-10-15 ENCOUNTER — Encounter (INDEPENDENT_AMBULATORY_CARE_PROVIDER_SITE_OTHER): Payer: Self-pay | Admitting: Family Medicine

## 2022-10-15 ENCOUNTER — Other Ambulatory Visit (HOSPITAL_COMMUNITY): Payer: Self-pay

## 2022-10-15 ENCOUNTER — Ambulatory Visit (INDEPENDENT_AMBULATORY_CARE_PROVIDER_SITE_OTHER): Payer: Commercial Managed Care - PPO | Admitting: Family Medicine

## 2022-10-15 VITALS — BP 142/84 | HR 84 | Temp 97.7°F | Ht 68.0 in | Wt 269.0 lb

## 2022-10-15 DIAGNOSIS — E1165 Type 2 diabetes mellitus with hyperglycemia: Secondary | ICD-10-CM | POA: Diagnosis not present

## 2022-10-15 DIAGNOSIS — Z7985 Long-term (current) use of injectable non-insulin antidiabetic drugs: Secondary | ICD-10-CM | POA: Diagnosis not present

## 2022-10-15 DIAGNOSIS — E669 Obesity, unspecified: Secondary | ICD-10-CM

## 2022-10-15 DIAGNOSIS — E559 Vitamin D deficiency, unspecified: Secondary | ICD-10-CM

## 2022-10-15 DIAGNOSIS — Z6841 Body Mass Index (BMI) 40.0 and over, adult: Secondary | ICD-10-CM | POA: Diagnosis not present

## 2022-10-15 MED ORDER — VITAMIN D (ERGOCALCIFEROL) 1.25 MG (50000 UNIT) PO CAPS
50000.0000 [IU] | ORAL_CAPSULE | ORAL | 0 refills | Status: DC
Start: 2022-10-15 — End: 2022-11-13
  Filled 2022-10-15: qty 4, 28d supply, fill #0

## 2022-10-15 MED ORDER — TIRZEPATIDE 7.5 MG/0.5ML ~~LOC~~ SOAJ
7.5000 mg | SUBCUTANEOUS | 0 refills | Status: DC
Start: 2022-10-15 — End: 2022-11-13
  Filled 2022-10-15: qty 2, 28d supply, fill #0

## 2022-10-15 NOTE — Progress Notes (Signed)
Chief Complaint:   OBESITY Barbara Trevino is here to discuss her progress with her obesity treatment plan along with follow-up of her obesity related diagnoses. Barbara Trevino is on the [employer] Plan + 300 calories and states she is following her eating plan approximately 60-70% of the time. Barbara Trevino states she is active while doing yard work, couch exercises, and walking 4-5 times per week.    Today's visit was #: 15 Starting weight: 296 lbs Starting date: 10/31/2021 Today's weight: 269 lbs Today's date: 10/15/2022 Total lbs lost to date: 27 Total lbs lost since last in-office visit: 7  Interim History: Patient was able to see her granddaughter before her vacation to [facility]. She is surprised with the change seen on the scale.  She was able to enjoy 2 weeks with her boys and their wives and her grandkids. She is going to [place] in September otherwise only has a beach trip.    Subjective:   1. Vitamin D deficiency Patient is on prescription vitamin D.  She denies nausea, vomiting, or muscle weakness but notes fatigue.  2. Type 2 diabetes mellitus with hyperglycemia, without long-term current use of insulin (HCC) Patient's recent A1c improved to 6.1 from 7.1.  She is on Mounjaro 7.5 mg.  She notes less satiety recently on Mounjaro.  Assessment/Plan:   1. Vitamin D deficiency Patient will continue prescription vitamin D once weekly, we will refill for 1 month.  - Vitamin D, Ergocalciferol, (DRISDOL) 1.25 MG (50000 UNIT) CAPS capsule; Take 1 capsule (50,000 Units total) by mouth every 7 (seven) days.  Dispense: 4 capsule; Refill: 0  2. Type 2 diabetes mellitus with hyperglycemia, without long-term current use of insulin (HCC) Patient will continue Mounjaro 7.5 mg, and we will refill for 1 month.  - tirzepatide (MOUNJARO) 7.5 MG/0.5ML Pen; Inject 7.5 mg into the skin once a week.  Dispense: 2 mL; Refill: 0  3. BMI 40.0-44.9, adult (HCC)  4. Obesity with starting BMI of 45.0 Barbara Trevino  is currently in the action stage of change. As such, her goal is to continue with weight loss efforts. She has agreed to the [employer] + 300 calories.   Exercise goals: All adults should avoid inactivity. Some physical activity is better than none, and adults who participate in any amount of physical activity gain some health benefits.  Behavioral modification strategies: increasing lean protein intake, meal planning and cooking strategies, keeping healthy foods in the home, and planning for success.  Barbara Trevino has agreed to follow-up with our clinic in 4 weeks. She was informed of the importance of frequent follow-up visits to maximize her success with intensive lifestyle modifications for her multiple health conditions.   Objective:   Blood pressure (!) 142/84, pulse 84, temperature 97.7 F (36.5 C), height 5\' 8"  (1.727 m), weight 269 lb (122 kg), SpO2 97%. Body mass index is 40.9 kg/m.  General: Cooperative, alert, well developed, in no acute distress. HEENT: Conjunctivae and lids unremarkable. Cardiovascular: Regular rhythm.  Lungs: Normal work of breathing. Neurologic: No focal deficits.   Lab Results  Component Value Date   CREATININE 1.15 (H) 07/17/2022   BUN 13 07/17/2022   NA 138 07/17/2022   K 5.2 07/17/2022   CL 102 07/17/2022   CO2 21 07/17/2022   Lab Results  Component Value Date   ALT 24 07/17/2022   AST 19 07/17/2022   ALKPHOS 87 07/17/2022   BILITOT 0.3 07/17/2022   Lab Results  Component Value Date   HGBA1C  6.1 (H) 07/17/2022   HGBA1C 7.1 (H) 10/31/2021   HGBA1C 6.3 (H) 06/18/2018   Lab Results  Component Value Date   INSULIN 38.0 (H) 07/17/2022   INSULIN 39.0 (H) 10/31/2021   INSULIN 32.2 (H) 06/18/2018   Lab Results  Component Value Date   TSH 1.200 10/31/2021   Lab Results  Component Value Date   CHOL 157 07/17/2022   HDL 64 07/17/2022   LDLCALC 70 07/17/2022   TRIG 130 07/17/2022   Lab Results  Component Value Date   VD25OH 45.6  07/17/2022   VD25OH 34.8 10/31/2021   VD25OH 27.4 (L) 06/18/2018   Lab Results  Component Value Date   WBC 5.5 10/31/2021   HGB 13.3 10/31/2021   HCT 39.6 10/31/2021   MCV 88 10/31/2021   PLT 230 10/31/2021   No results found for: "IRON", "TIBC", "FERRITIN"  Attestation Statements:   Reviewed by clinician on day of visit: allergies, medications, problem list, medical history, surgical history, family history, social history, and previous encounter notes.   I, Burt Knack, am acting as transcriptionist for Reuben Likes, MD.  I have reviewed the above documentation for accuracy and completeness, and I agree with the above. - Reuben Likes, MD

## 2022-10-25 ENCOUNTER — Other Ambulatory Visit (HOSPITAL_COMMUNITY): Payer: Self-pay

## 2022-10-26 ENCOUNTER — Other Ambulatory Visit (HOSPITAL_COMMUNITY): Payer: Self-pay

## 2022-10-26 MED ORDER — CYCLOBENZAPRINE HCL 10 MG PO TABS
10.0000 mg | ORAL_TABLET | Freq: Three times a day (TID) | ORAL | 1 refills | Status: DC
  Filled 2022-10-26: qty 90, 30d supply, fill #0
  Filled 2022-11-21: qty 90, 30d supply, fill #1

## 2022-11-13 ENCOUNTER — Ambulatory Visit (INDEPENDENT_AMBULATORY_CARE_PROVIDER_SITE_OTHER): Payer: Commercial Managed Care - PPO | Admitting: Family Medicine

## 2022-11-13 ENCOUNTER — Other Ambulatory Visit (HOSPITAL_COMMUNITY): Payer: Self-pay

## 2022-11-13 ENCOUNTER — Encounter (INDEPENDENT_AMBULATORY_CARE_PROVIDER_SITE_OTHER): Payer: Self-pay | Admitting: Family Medicine

## 2022-11-13 VITALS — BP 136/81 | HR 88 | Temp 97.5°F | Ht 68.0 in | Wt 271.0 lb

## 2022-11-13 DIAGNOSIS — Z6841 Body Mass Index (BMI) 40.0 and over, adult: Secondary | ICD-10-CM | POA: Diagnosis not present

## 2022-11-13 DIAGNOSIS — E669 Obesity, unspecified: Secondary | ICD-10-CM | POA: Diagnosis not present

## 2022-11-13 DIAGNOSIS — E1165 Type 2 diabetes mellitus with hyperglycemia: Secondary | ICD-10-CM | POA: Diagnosis not present

## 2022-11-13 DIAGNOSIS — Z7985 Long-term (current) use of injectable non-insulin antidiabetic drugs: Secondary | ICD-10-CM | POA: Diagnosis not present

## 2022-11-13 DIAGNOSIS — E559 Vitamin D deficiency, unspecified: Secondary | ICD-10-CM

## 2022-11-13 MED ORDER — VITAMIN D (ERGOCALCIFEROL) 1.25 MG (50000 UNIT) PO CAPS
50000.0000 [IU] | ORAL_CAPSULE | ORAL | 0 refills | Status: DC
Start: 2022-11-13 — End: 2022-12-11
  Filled 2022-11-13: qty 4, 28d supply, fill #0

## 2022-11-13 MED ORDER — TIRZEPATIDE 7.5 MG/0.5ML ~~LOC~~ SOAJ
7.5000 mg | SUBCUTANEOUS | 0 refills | Status: DC
Start: 2022-11-13 — End: 2022-12-11
  Filled 2022-11-13: qty 2, 28d supply, fill #0

## 2022-11-13 NOTE — Progress Notes (Signed)
Chief Complaint:   OBESITY Barbara Trevino is here to discuss her progress with her obesity treatment plan along with follow-up of her obesity related diagnoses. Barbara Trevino is on the Pes[employer]arian Plan + 300 calories and states she is following her eating plan approximately 60% of the time. Barbara Trevino states she is doing 0 minutes 0 times per week.  Today's visit was #: 16 Starting weight: 296 lbs Starting date: 10/31/2021 Today's weight: 271 lbs Today's date: 11/13/2022 Total lbs lost to date: 25 Total lbs lost since last in-office visit: 0  Interim History: Patient feels somewhat upset with her weight gain.  She celebrated her birthday and one of her grandchildren's first birthday and she realizes she hasn't follow plan as specifically as possible.  She has an upcoming trip to [place] and [place].  She wants to be more focused on the plan over the next few weeks.  She states dinner has been indulgent than she planned.   Subjective:   1. Type 2 diabetes mellitus with hyperglycemia, without long-term current use of insulin (HCC) Patient is on Mounjaro 7.5 mg subcu.  No GI side effects were noted.  Her last A1c was 6.1 (this was in April 2024).  2. Vitamin D deficiency Patient denies nausea, vomiting, or muscle weakness but notes fatigue.  She is on prescription vitamin D.  Assessment/Plan:   1. Type 2 diabetes mellitus with hyperglycemia, without long-term current use of insulin (HCC) Patient will continue Mounjaro 7.5 mg subcu weekly, and we will refill for 1 month.  - tirzepatide (MOUNJARO) 7.5 MG/0.5ML Pen; Inject 7.5 mg into the skin once a week.  Dispense: 2 mL; Refill: 0  2. Vitamin D deficiency Patient will continue prescription vitamin D once weekly, and we will refill for 1 month.  - Vitamin D, Ergocalciferol, (DRISDOL) 1.25 MG (50000 UNIT) CAPS capsule; Take 1 capsule (50,000 Units total) by mouth every 7 (seven) days.  Dispense: 4 capsule; Refill: 0  3. BMI 40.0-44.9, adult  (HCC)  4. Obesity with starting BMI of 45.0 Arine is currently in the action stage of change. As such, her goal is to continue with weight loss efforts. She has agreed to the [employer] + 300 calories.   Exercise goals: No exercise has been prescribed at this time.  Behavioral modification strategies: increasing lean protein intake, meal planning and cooking strategies, keeping healthy foods in the home, and planning for success.  Shadonna has agreed to follow-up with our clinic in 4 weeks. She was informed of the importance of frequent follow-up visits to maximize her success with intensive lifestyle modifications for her multiple health conditions.   Objective:   Blood pressure 136/81, pulse 88, temperature (!) 97.5 F (36.4 C), height 5\' 8"  (1.727 m), weight 271 lb (122.9 kg), SpO2 100%. Body mass index is 41.21 kg/m.  General: Cooperative, alert, well developed, in no acute distress. HEENT: Conjunctivae and lids unremarkable. Cardiovascular: Regular rhythm.  Lungs: Normal work of breathing. Neurologic: No focal deficits.   Lab Results  Component Value Date   CREATININE 1.15 (H) 07/17/2022   BUN 13 07/17/2022   NA 138 07/17/2022   K 5.2 07/17/2022   CL 102 07/17/2022   CO2 21 07/17/2022   Lab Results  Component Value Date   ALT 24 07/17/2022   AST 19 07/17/2022   ALKPHOS 87 07/17/2022   BILITOT 0.3 07/17/2022   Lab Results  Component Value Date   HGBA1C 6.1 (H) 07/17/2022   HGBA1C 7.1 (H) 10/31/2021  HGBA1C 6.3 (H) 06/18/2018   Lab Results  Component Value Date   INSULIN 38.0 (H) 07/17/2022   INSULIN 39.0 (H) 10/31/2021   INSULIN 32.2 (H) 06/18/2018   Lab Results  Component Value Date   TSH 1.200 10/31/2021   Lab Results  Component Value Date   CHOL 157 07/17/2022   HDL 64 07/17/2022   LDLCALC 70 07/17/2022   TRIG 130 07/17/2022   Lab Results  Component Value Date   VD25OH 45.6 07/17/2022   VD25OH 34.8 10/31/2021   VD25OH 27.4 (L) 06/18/2018    Lab Results  Component Value Date   WBC 5.5 10/31/2021   HGB 13.3 10/31/2021   HCT 39.6 10/31/2021   MCV 88 10/31/2021   PLT 230 10/31/2021   No results found for: "IRON", "TIBC", "FERRITIN"  Attestation Statements:   Reviewed by clinician on day of visit: allergies, medications, problem list, medical history, surgical history, family history, social history, and previous encounter notes.   I, Burt Knack, am acting as transcriptionist for Reuben Likes, MD.  I have reviewed the above documentation for accuracy and completeness, and I agree with the above. - Reuben Likes, MD

## 2022-11-14 ENCOUNTER — Other Ambulatory Visit (HOSPITAL_COMMUNITY): Payer: Self-pay

## 2022-11-16 ENCOUNTER — Other Ambulatory Visit (HOSPITAL_COMMUNITY): Payer: Self-pay

## 2022-11-16 MED ORDER — CEPHALEXIN 250 MG PO CAPS
250.0000 mg | ORAL_CAPSULE | Freq: Every day | ORAL | 0 refills | Status: DC
  Filled 2022-11-16: qty 90, 90d supply, fill #0

## 2022-12-05 ENCOUNTER — Other Ambulatory Visit (HOSPITAL_COMMUNITY): Payer: Self-pay

## 2022-12-05 MED ORDER — AMOXICILLIN 500 MG PO CAPS
500.0000 mg | ORAL_CAPSULE | Freq: Three times a day (TID) | ORAL | 1 refills | Status: DC
  Filled 2022-12-05: qty 21, 7d supply, fill #0

## 2022-12-05 MED ORDER — OXYCODONE HCL 5 MG PO TABS
5.0000 mg | ORAL_TABLET | Freq: Four times a day (QID) | ORAL | 0 refills | Status: DC | PRN
Start: 2022-12-05 — End: 2023-02-06
  Filled 2022-12-05: qty 12, 3d supply, fill #0

## 2022-12-11 ENCOUNTER — Encounter (INDEPENDENT_AMBULATORY_CARE_PROVIDER_SITE_OTHER): Payer: Self-pay | Admitting: Internal Medicine

## 2022-12-11 ENCOUNTER — Other Ambulatory Visit (HOSPITAL_COMMUNITY): Payer: Self-pay

## 2022-12-11 ENCOUNTER — Ambulatory Visit (INDEPENDENT_AMBULATORY_CARE_PROVIDER_SITE_OTHER): Payer: Commercial Managed Care - PPO | Admitting: Internal Medicine

## 2022-12-11 VITALS — BP 134/83 | HR 8 | Temp 97.7°F | Ht 68.0 in | Wt 274.0 lb

## 2022-12-11 DIAGNOSIS — E1165 Type 2 diabetes mellitus with hyperglycemia: Secondary | ICD-10-CM | POA: Diagnosis not present

## 2022-12-11 DIAGNOSIS — Z7985 Long-term (current) use of injectable non-insulin antidiabetic drugs: Secondary | ICD-10-CM | POA: Diagnosis not present

## 2022-12-11 DIAGNOSIS — Z6841 Body Mass Index (BMI) 40.0 and over, adult: Secondary | ICD-10-CM | POA: Diagnosis not present

## 2022-12-11 DIAGNOSIS — E559 Vitamin D deficiency, unspecified: Secondary | ICD-10-CM

## 2022-12-11 DIAGNOSIS — R635 Abnormal weight gain: Secondary | ICD-10-CM

## 2022-12-11 DIAGNOSIS — T50905A Adverse effect of unspecified drugs, medicaments and biological substances, initial encounter: Secondary | ICD-10-CM

## 2022-12-11 MED ORDER — OXYCODONE HCL 5 MG PO TABS
5.0000 mg | ORAL_TABLET | Freq: Four times a day (QID) | ORAL | 0 refills | Status: DC | PRN
Start: 2022-12-11 — End: 2023-02-06
  Filled 2022-12-11: qty 8, 2d supply, fill #0

## 2022-12-11 MED ORDER — VITAMIN D (ERGOCALCIFEROL) 1.25 MG (50000 UNIT) PO CAPS
50000.0000 [IU] | ORAL_CAPSULE | ORAL | 0 refills | Status: DC
  Filled 2022-12-11: qty 4, 28d supply, fill #0

## 2022-12-11 MED ORDER — TIRZEPATIDE 10 MG/0.5ML ~~LOC~~ SOAJ
10.0000 mg | SUBCUTANEOUS | 0 refills | Status: DC
Start: 2022-12-11 — End: 2023-01-08
  Filled 2022-12-11 (×2): qty 2, 28d supply, fill #0

## 2022-12-11 NOTE — Progress Notes (Unsigned)
Office: [phone]  /  Fax: [phone]  WEIGHT SUMMARY AND BIOMETRICS  Vitals Temp: 97.7 F (36.5 C) BP: 134/83 Pulse Rate: (!) 8 SpO2: 98 %   Anthropometric Measurements Height: 5\' 8"  (1.727 m) Weight: 274 lb (124.3 kg) BMI (Calculated): 41.67 Weight at Last Visit: 271 lb Weight Lost Since Last Visit: 0 lb Weight Gained Since Last Visit: 3 lb Starting Weight: 288 lb   Body Composition  Body Fat %: 53.2 % Fat Mass (lbs): 145.8 lbs Muscle Mass (lbs): 122 lbs Visceral Fat Rating : 17    No data recorded Today's Visit #: 16  Starting Date: 06/18/22   HPI  Chief Complaint: OBESITY  Barbara Trevino is here to discuss her progress with her obesity treatment plan. She is on the the [facility] and states she is following her eating plan approximately 50 % of the time. She states she is exercising 20 minutes 3 times per week.  Interval History:  Since last office visit she has gained 3 pounds. She reports fair adherence to reduced calorie nutritional plan. She has been working on not skipping meals and increasing protein intake at every meal  Orexigenic Control: Reports problems with appetite and hunger signals.  Reports problems with satiety and satiation.  Denies problems with eating patterns and portion control.  Denies abnormal cravings. Denies feeling deprived or restricted.   Barriers identified: lack of time for self-care, strong hunger signals and appetite, low volume of physical acitivity, and presence of obesogenic drugs.   Pharmacotherapy for weight loss: She is currently taking Monjauro with diabetes as the primary indication without clinical response and without side effects..    ASSESSMENT AND PLAN  TREATMENT PLAN FOR OBESITY:  Recommended Dietary Goals  Barbara Trevino is currently in the action stage of change. As such, her goal is to continue weight management plan. She has agreed to: continue current plan  Behavioral Intervention  We discussed  the following Behavioral Modification Strategies today: increasing lean protein intake, decreasing simple carbohydrates , increasing vegetables, increasing lower glycemic fruits, increasing fiber rich foods, avoiding skipping meals, increasing water intake, work on meal planning and preparation, continue to practice mindfulness when eating, and planning for success.  Additional resources provided today: None  Recommended Physical Activity Goals  Barbara Trevino has been advised to work up to 150 minutes of moderate intensity aerobic activity a week and strengthening exercises 2-3 times per week for cardiovascular health, weight loss maintenance and preservation of muscle mass.   She has agreed to :  Think about ways to increase daily physical activity and overcoming barriers to exercise and Increase physical activity in their day and reduce sedentary time (increase NEAT).  Pharmacotherapy We discussed various medication options to help [name] with her weight loss efforts and we both agreed to : increase Mounjaro to 10 mg once a week  ASSOCIATED CONDITIONS ADDRESSED TODAY  Weight gain due to medication Assessment & Plan: Based on records she is on tricyclic, gabapentinoid, anticholinergics and psychotropic medication.  This may affect weight loss progress.  I think is worthwhile exploring in the future to see if these medications are still active and also I there are other weight neutral options   Vitamin D deficiency Assessment & Plan: Most recent vitamin D levels  Lab Results  Component Value Date   VD25OH 45.6 07/17/2022   VD25OH 34.8 10/31/2021   VD25OH 27.4 (L) 06/18/2018     Deficiency state associated with adiposity and may result in leptin resistance, weight gain and fatigue.  Currently on vitamin D supplementation without any adverse effects.  Plan: Continue high-dose vitamin D supplementation consider transitioning to over-the-counter supplement to reduce risk of  hypervitaminosis.   Orders: -     Vitamin D (Ergocalciferol); Take 1 capsule (50,000 Units total) by mouth every 7 (seven) days.  Dispense: 4 capsule; Refill: 0  Type 2 diabetes mellitus with hyperglycemia, without long-term current use of insulin (HCC) Assessment & Plan: HgbA1c is at goal for age and comorbid conditions. Denies symptoms of hypoglycemia or hyperglycemia. On Mounjaro 7.5 mg once a week with good adherence and no side effects.   Counseled on goals of care, monitoring for complications and importance of staying updated on immunizations and diabetes preventive measures. Continue with reduced calorie meal plan low on processed crabs and simple sugars. Ongoing weight loss will improve insulin resistance and glycemic control  Lab Results  Component Value Date   HGBA1C 6.1 (H) 07/17/2022   HGBA1C 7.1 (H) 10/31/2021   HGBA1C 6.3 (H) 06/18/2018   Lab Results  Component Value Date   LDLCALC 70 07/17/2022   CREATININE 1.15 (H) 07/17/2022   Patient is experiencing a waning incretin effect we will increase to Mounjaro 10 mg once weekly.  We discussed mechanisms of action, medication side effects and nutritional strategies to reduce these.   Orders: -     Tirzepatide; Inject 10 mg into the skin once a week.  Dispense: 2 mL; Refill: 0  Obesity with starting BMI of 45.0 Assessment & Plan: Patient has lost approximately 29 pounds her muscle mass to fat mass loss ratio is about 25%.  We reinforced the importance of getting the recommended amount of protein of about 30 to 40 g per meal.  Also the benefits of strengthening exercises.  Will increasing her Mounjaro due to waning incretin effect.     PHYSICAL EXAM:  Blood pressure 134/83, pulse (!) 8, temperature 97.7 F (36.5 C), height 5\' 8"  (1.727 m), weight 274 lb (124.3 kg), SpO2 98%. Body mass index is 41.66 kg/m.  General: She is overweight, cooperative, alert, well developed, and in no acute distress. PSYCH: Has normal  mood, affect and thought process.   HEENT: EOMI, sclerae are anicteric. Lungs: Normal breathing effort, no conversational dyspnea. Extremities: No edema.  Neurologic: No gross sensory or motor deficits. No tremors or fasciculations noted.    DIAGNOSTIC DATA REVIEWED:  BMET    Component Value Date/Time   NA 138 07/17/2022 0718   K 5.2 07/17/2022 0718   CL 102 07/17/2022 0718   CO2 21 07/17/2022 0718   GLUCOSE 124 (H) 07/17/2022 0718   GLUCOSE 98 01/04/2011 0919   BUN 13 07/17/2022 0718   CREATININE 1.15 (H) 07/17/2022 0718   CALCIUM 9.4 07/17/2022 0718   GFRNONAA 58 (L) 06/18/2018 1356   GFRAA 67 06/18/2018 1356   Lab Results  Component Value Date   HGBA1C 6.1 (H) 07/17/2022   HGBA1C 6.3 (H) 06/18/2018   Lab Results  Component Value Date   INSULIN 38.0 (H) 07/17/2022   INSULIN 32.2 (H) 06/18/2018   Lab Results  Component Value Date   TSH 1.200 10/31/2021   CBC    Component Value Date/Time   WBC 5.5 10/31/2021 1006   WBC 3.9 01/04/2011 0919   WBC 4.2 06/15/2009 1140   RBC 4.50 10/31/2021 1006   RBC 4.03 01/04/2011 0919   RBC 3.88 06/15/2009 1140   HGB 13.3 10/31/2021 1006   HGB 12.3 01/04/2011 0919   HCT 39.6 10/31/2021 1006  HCT 35.5 01/04/2011 0919   PLT 230 10/31/2021 1006   MCV 88 10/31/2021 1006   MCV 88.0 01/04/2011 0919   MCH 29.6 10/31/2021 1006   MCH 30.5 01/04/2011 0919   MCHC 33.6 10/31/2021 1006   MCHC 34.6 01/04/2011 0919   MCHC 33.8 06/15/2009 1140   RDW 13.9 10/31/2021 1006   RDW 14.1 01/04/2011 0919   Iron Studies No results found for: "IRON", "TIBC", "FERRITIN", "IRONPCTSAT" Lipid Panel     Component Value Date/Time   CHOL 157 07/17/2022 0718   TRIG 130 07/17/2022 0718   HDL 64 07/17/2022 0718   LDLCALC 70 07/17/2022 0718   Hepatic Function Panel     Component Value Date/Time   PROT 6.5 07/17/2022 0718   ALBUMIN 4.1 07/17/2022 0718   AST 19 07/17/2022 0718   ALT 24 07/17/2022 0718   ALKPHOS 87 07/17/2022 0718   BILITOT 0.3  07/17/2022 0718      Component Value Date/Time   TSH 1.200 10/31/2021 1006   Nutritional Lab Results  Component Value Date   VD25OH 45.6 07/17/2022   VD25OH 34.8 10/31/2021   VD25OH 27.4 (L) 06/18/2018     Return in about 4 weeks (around 01/08/2023) for Dr. Lawson Radar.. She was informed of the importance of frequent follow up visits to maximize her success with intensive lifestyle modifications for her multiple health conditions.   ATTESTASTION STATEMENTS:  Reviewed by clinician on day of visit: allergies, medications, problem list, medical history, surgical history, family history, social history, and previous encounter notes.     Worthy Rancher, MD

## 2022-12-12 ENCOUNTER — Other Ambulatory Visit (HOSPITAL_COMMUNITY): Payer: Self-pay

## 2022-12-12 DIAGNOSIS — E1165 Type 2 diabetes mellitus with hyperglycemia: Secondary | ICD-10-CM | POA: Insufficient documentation

## 2022-12-12 DIAGNOSIS — E559 Vitamin D deficiency, unspecified: Secondary | ICD-10-CM | POA: Insufficient documentation

## 2022-12-12 DIAGNOSIS — R635 Abnormal weight gain: Secondary | ICD-10-CM | POA: Insufficient documentation

## 2022-12-12 NOTE — Assessment & Plan Note (Signed)
Patient has lost approximately 29 pounds her muscle mass to fat mass loss ratio is about 25%.  We reinforced the importance of getting the recommended amount of protein of about 30 to 40 g per meal.  Also the benefits of strengthening exercises.  Will increasing her Mounjaro due to waning incretin effect.

## 2022-12-12 NOTE — Assessment & Plan Note (Signed)
HgbA1c is at goal for age and comorbid conditions. Denies symptoms of hypoglycemia or hyperglycemia. On Mounjaro 7.5 mg once a week with good adherence and no side effects.   Counseled on goals of care, monitoring for complications and importance of staying updated on immunizations and diabetes preventive measures. Continue with reduced calorie meal plan low on processed crabs and simple sugars. Ongoing weight loss will improve insulin resistance and glycemic control  Lab Results  Component Value Date   HGBA1C 6.1 (H) 07/17/2022   HGBA1C 7.1 (H) 10/31/2021   HGBA1C 6.3 (H) 06/18/2018   Lab Results  Component Value Date   LDLCALC 70 07/17/2022   CREATININE 1.15 (H) 07/17/2022   Patient is experiencing a waning incretin effect we will increase to Mounjaro 10 mg once weekly.  We discussed mechanisms of action, medication side effects and nutritional strategies to reduce these.

## 2022-12-12 NOTE — Assessment & Plan Note (Signed)
Most recent vitamin D levels  Lab Results  Component Value Date   VD25OH 45.6 07/17/2022   VD25OH 34.8 10/31/2021   VD25OH 27.4 (L) 06/18/2018     Deficiency state associated with adiposity and may result in leptin resistance, weight gain and fatigue. Currently on vitamin D supplementation without any adverse effects.  Plan: Continue high-dose vitamin D supplementation consider transitioning to over-the-counter supplement to reduce risk of hypervitaminosis.

## 2022-12-12 NOTE — Assessment & Plan Note (Signed)
Based on records she is on tricyclic, gabapentinoid, anticholinergics and psychotropic medication.  This may affect weight loss progress.  I think is worthwhile exploring in the future to see if these medications are still active and also I there are other weight neutral options

## 2023-01-02 ENCOUNTER — Other Ambulatory Visit (HOSPITAL_COMMUNITY): Payer: Self-pay

## 2023-01-03 ENCOUNTER — Other Ambulatory Visit (HOSPITAL_COMMUNITY): Payer: Self-pay

## 2023-01-03 ENCOUNTER — Other Ambulatory Visit: Payer: Self-pay

## 2023-01-03 MED ORDER — ROSUVASTATIN CALCIUM 20 MG PO TABS
20.0000 mg | ORAL_TABLET | Freq: Every day | ORAL | 1 refills | Status: DC
  Filled 2023-01-03: qty 90, 90d supply, fill #0
  Filled 2023-04-07: qty 90, 90d supply, fill #1

## 2023-01-04 ENCOUNTER — Other Ambulatory Visit (HOSPITAL_COMMUNITY): Payer: Self-pay

## 2023-01-08 ENCOUNTER — Encounter (INDEPENDENT_AMBULATORY_CARE_PROVIDER_SITE_OTHER): Payer: Self-pay | Admitting: Family Medicine

## 2023-01-08 ENCOUNTER — Other Ambulatory Visit (HOSPITAL_COMMUNITY): Payer: Self-pay

## 2023-01-08 ENCOUNTER — Ambulatory Visit (INDEPENDENT_AMBULATORY_CARE_PROVIDER_SITE_OTHER): Payer: Commercial Managed Care - PPO | Admitting: Family Medicine

## 2023-01-08 VITALS — BP 128/80 | HR 91 | Temp 97.8°F | Ht 68.0 in | Wt 266.0 lb

## 2023-01-08 DIAGNOSIS — Z6841 Body Mass Index (BMI) 40.0 and over, adult: Secondary | ICD-10-CM

## 2023-01-08 DIAGNOSIS — E1165 Type 2 diabetes mellitus with hyperglycemia: Secondary | ICD-10-CM

## 2023-01-08 DIAGNOSIS — E559 Vitamin D deficiency, unspecified: Secondary | ICD-10-CM

## 2023-01-08 DIAGNOSIS — E669 Obesity, unspecified: Secondary | ICD-10-CM | POA: Diagnosis not present

## 2023-01-08 DIAGNOSIS — Z7985 Long-term (current) use of injectable non-insulin antidiabetic drugs: Secondary | ICD-10-CM | POA: Diagnosis not present

## 2023-01-08 MED ORDER — TIRZEPATIDE 10 MG/0.5ML ~~LOC~~ SOAJ
10.0000 mg | SUBCUTANEOUS | 0 refills | Status: DC
Start: 2023-01-08 — End: 2023-02-06
  Filled 2023-01-08: qty 2, 28d supply, fill #0

## 2023-01-08 MED ORDER — VITAMIN D (ERGOCALCIFEROL) 1.25 MG (50000 UNIT) PO CAPS
50000.0000 [IU] | ORAL_CAPSULE | ORAL | 0 refills | Status: DC
Start: 2023-01-08 — End: 2023-04-17
  Filled 2023-01-08: qty 4, 28d supply, fill #0

## 2023-01-08 NOTE — Progress Notes (Signed)
Chief Complaint:   OBESITY Barbara Trevino is here to discuss her progress with her obesity treatment plan along with follow-up of her obesity related diagnoses. Barbara Trevino is on the Pescatarian Plan + 300 calories and states she is following her eating plan approximately 80% of the time. Barbara Trevino states she is walking for 20-30 minutes 3 times per week.  Today's visit was #: 18 Starting weight: 296 lbs Starting date: 10/31/2021 Today's weight: 266 lbs Today's date: 01/08/2023 Total lbs lost to date: 30 Total lbs lost since last in-office visit: 8  Interim History: Since last appointment patient has been mostly working.  She feels like she has done fairly well on meal plan and increased her Mounjaro up to 10. This weekend she is keeping her [place].  Her twins birthday is coming up in a few weeks.  Not sure if any changes need to be made to the meal plan moving forward.  She is not eating as many eggs.   Subjective:   1. Vitamin D deficiency Patient is on prescription vitamin D, and she denies nausea, vomiting, or muscle weakness but notes fatigue.  Her last vitamin D level was of 45.6.  2. Type 2 diabetes mellitus with hyperglycemia, without long-term current use of insulin (HCC) Patient is on Mounjaro 10 mg with no side effects on higher dose.  Assessment/Plan:   1. Vitamin D deficiency We will refill prescription vitamin D 50,000 IU once weekly for 1 month.  - Vitamin D, Ergocalciferol, (DRISDOL) 1.25 MG (50000 UNIT) CAPS capsule; Take 1 capsule (50,000 Units total) by mouth every 7 (seven) days.  Dispense: 4 capsule; Refill: 0  2. Type 2 diabetes mellitus with hyperglycemia, without long-term current use of insulin (HCC) We will refill Mounjaro 10 mg subcu weekly for 1 month.  - tirzepatide (MOUNJARO) 10 MG/0.5ML Pen; Inject 10 mg into the skin once a week.  Dispense: 2 mL; Refill: 0  3. BMI 40.0-44.9, adult (HCC)  4. Obesity with starting BMI of 45.0 Barbara Trevino is currently in the action  stage of change. As such, her goal is to continue with weight loss efforts. She has agreed to the [employer].   Exercise goals: All adults should avoid inactivity. Some physical activity is better than none, and adults who participate in any amount of physical activity gain some health benefits.  Patient is to stay consistent with walking.  Behavioral modification strategies: increasing lean protein intake, meal planning and cooking strategies, keeping healthy foods in the home, and planning for success.  Barbara Trevino has agreed to follow-up with our clinic in 4 weeks. She was informed of the importance of frequent follow-up visits to maximize her success with intensive lifestyle modifications for her multiple health conditions.   Objective:   Blood pressure 128/80, pulse 91, temperature 97.8 F (36.6 C), height 5\' 8"  (1.727 m), weight 266 lb (120.7 kg), SpO2 98%. Body mass index is 40.45 kg/m.  General: Cooperative, alert, well developed, in no acute distress. HEENT: Conjunctivae and lids unremarkable. Cardiovascular: Regular rhythm.  Lungs: Normal work of breathing. Neurologic: No focal deficits.   Lab Results  Component Value Date   CREATININE 1.15 (H) 07/17/2022   BUN 13 07/17/2022   NA 138 07/17/2022   K 5.2 07/17/2022   CL 102 07/17/2022   CO2 21 07/17/2022   Lab Results  Component Value Date   ALT 24 07/17/2022   AST 19 07/17/2022   ALKPHOS 87 07/17/2022   BILITOT 0.3 07/17/2022   Lab Results  Component Value Date   HGBA1C 6.1 (H) 07/17/2022   HGBA1C 7.1 (H) 10/31/2021   HGBA1C 6.3 (H) 06/18/2018   Lab Results  Component Value Date   INSULIN 38.0 (H) 07/17/2022   INSULIN 39.0 (H) 10/31/2021   INSULIN 32.2 (H) 06/18/2018   Lab Results  Component Value Date   TSH 1.200 10/31/2021   Lab Results  Component Value Date   CHOL 157 07/17/2022   HDL 64 07/17/2022   LDLCALC 70 07/17/2022   TRIG 130 07/17/2022   Lab Results  Component Value Date   VD25OH 45.6  07/17/2022   VD25OH 34.8 10/31/2021   VD25OH 27.4 (L) 06/18/2018   Lab Results  Component Value Date   WBC 5.5 10/31/2021   HGB 13.3 10/31/2021   HCT 39.6 10/31/2021   MCV 88 10/31/2021   PLT 230 10/31/2021   No results found for: "IRON", "TIBC", "FERRITIN"  Attestation Statements:   Reviewed by clinician on day of visit: allergies, medications, problem list, medical history, surgical history, family history, social history, and previous encounter notes.   I, Burt Knack, am acting as transcriptionist for Reuben Likes, MD.  I have reviewed the above documentation for accuracy and completeness, and I agree with the above. - Reuben Likes, MD

## 2023-01-09 ENCOUNTER — Other Ambulatory Visit (HOSPITAL_COMMUNITY): Payer: Self-pay

## 2023-01-09 ENCOUNTER — Other Ambulatory Visit: Payer: Self-pay

## 2023-01-09 MED ORDER — CYCLOBENZAPRINE HCL 10 MG PO TABS
10.0000 mg | ORAL_TABLET | Freq: Three times a day (TID) | ORAL | 1 refills | Status: DC
  Filled 2023-01-09: qty 90, 30d supply, fill #0
  Filled 2023-03-08: qty 90, 30d supply, fill #1

## 2023-02-05 ENCOUNTER — Ambulatory Visit (INDEPENDENT_AMBULATORY_CARE_PROVIDER_SITE_OTHER): Payer: Commercial Managed Care - PPO | Admitting: Family Medicine

## 2023-02-06 ENCOUNTER — Other Ambulatory Visit (HOSPITAL_COMMUNITY): Payer: Self-pay

## 2023-02-06 ENCOUNTER — Encounter (INDEPENDENT_AMBULATORY_CARE_PROVIDER_SITE_OTHER): Payer: Self-pay | Admitting: Family Medicine

## 2023-02-06 ENCOUNTER — Ambulatory Visit (INDEPENDENT_AMBULATORY_CARE_PROVIDER_SITE_OTHER): Payer: Commercial Managed Care - PPO | Admitting: Family Medicine

## 2023-02-06 ENCOUNTER — Other Ambulatory Visit (INDEPENDENT_AMBULATORY_CARE_PROVIDER_SITE_OTHER): Payer: Self-pay | Admitting: Family Medicine

## 2023-02-06 VITALS — BP 122/78 | HR 60 | Temp 97.7°F | Ht 68.0 in | Wt 260.0 lb

## 2023-02-06 DIAGNOSIS — E669 Obesity, unspecified: Secondary | ICD-10-CM | POA: Diagnosis not present

## 2023-02-06 DIAGNOSIS — E559 Vitamin D deficiency, unspecified: Secondary | ICD-10-CM

## 2023-02-06 DIAGNOSIS — Z6839 Body mass index (BMI) 39.0-39.9, adult: Secondary | ICD-10-CM | POA: Diagnosis not present

## 2023-02-06 DIAGNOSIS — Z7985 Long-term (current) use of injectable non-insulin antidiabetic drugs: Secondary | ICD-10-CM | POA: Diagnosis not present

## 2023-02-06 DIAGNOSIS — E1165 Type 2 diabetes mellitus with hyperglycemia: Secondary | ICD-10-CM

## 2023-02-06 DIAGNOSIS — E785 Hyperlipidemia, unspecified: Secondary | ICD-10-CM | POA: Insufficient documentation

## 2023-02-06 DIAGNOSIS — E1169 Type 2 diabetes mellitus with other specified complication: Secondary | ICD-10-CM | POA: Diagnosis not present

## 2023-02-06 MED ORDER — TIRZEPATIDE 10 MG/0.5ML ~~LOC~~ SOAJ
10.0000 mg | SUBCUTANEOUS | 0 refills | Status: DC
  Filled 2023-02-06: qty 2, 28d supply, fill #0

## 2023-02-06 NOTE — Addendum Note (Signed)
Addended by: Langston Reusing on: 02/06/2023 04:26 PM   Modules accepted: Orders

## 2023-02-06 NOTE — Assessment & Plan Note (Signed)
Patient doing well on Mounjaro.  She is not experiencing GI issues. Last A 1c was in April and was 6.1.  She has been trying to stay consistent on Pescatarian meal plan.  She is still experiencing significant satiety so a dose increase is not needed in her medication.  She needs a refill today.  Labs were ordered today.

## 2023-02-06 NOTE — Progress Notes (Addendum)
SUBJECTIVE:  Chief Complaint: Obesity  Interim History: Patient received an erroneous text confirming her appointment yesterday. She has bee trying to stay adherent to Pescatarian plan around 50% of the time.  She is getting the popcorn ceiling removed in her house and her house is a mess currently.  Her granddaughter is starting to crawl.  She is planning a trip to [place] in February possibly. 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 For Thanksgiving she is going to her mothers and two of her kids will be there and and their families.  She mentions that she is going to [place] on Saturday with her grandkids.   Barbara Trevino is here to discuss her progress with her obesity treatment plan. She is on the [employer] and states she is following her eating plan approximately 50 % of the time. She states she is walking some 2 times per week.   OBJECTIVE: Visit Diagnoses: Problem List Items Addressed This Visit       Endocrine   Type 2 diabetes mellitus with hyperglycemia, without long-term current use of insulin (HCC)    Patient doing well on Mounjaro.  She is not experiencing GI issues. Last A 1c was in April and was 6.1.  She has been trying to stay consistent on Pescatarian meal plan.  She is still experiencing significant satiety so a dose increase is not needed in her medication.  She needs a refill today.  Labs were ordered today.      Relevant Medications   tirzepatide (MOUNJARO) 10 MG/0.5ML Pen   Other Relevant Orders   Comprehensive metabolic panel   Hemoglobin A1c   Insulin, random   Hyperlipidemia associated with type 2 diabetes mellitus (HCC)    On Crestor daily.  No myalgias or transaminitis.  Diabetes better controlled but not at goal yet. Last LDL at goal.  Repeat lipid panel ordered today.      Relevant Medications   tirzepatide (MOUNJARO) 10 MG/0.5ML Pen   Other Relevant Orders   Lipid Panel With LDL/HDL Ratio      Other   Vitamin D deficiency - Primary    On Rx Vitamin D replacement.  Last level in April was 45.6. Discussed importance of vitamin d supplementation.  Vitamin d supplementation has been shown to decrease fatigue, decrease risk of progression to insulin resistance and then prediabetes, decreases risk of falling in older age and can even assist in decreasing depressive symptoms in PTSD.   Prescription for Vitamin D sent in and lab ordered.      Relevant Orders   VITAMIN D 25 Hydroxy (Vit-D Deficiency, Fractures)   Obesity with starting BMI of 45.0   Relevant Medications   tirzepatide (MOUNJARO) 10 MG/0.5ML Pen   Other Visit Diagnoses     BMI 39.0-39.9,adult           Vitals Temp: 97.7 F (36.5 C) BP: 122/78 Pulse Rate: 60 SpO2: 98 %   Anthropometric Measurements Height: 5\' 8"  (1.727 m) Weight: 260 lb (117.9 kg) BMI (Calculated): 39.54 Weight at Last Visit: 266 lbs Weight Lost Since Last Visit: 6 Weight Gained Since Last Visit: 0 Starting Weight: 288 lb Total Weight Loss (lbs): 28 lb (12.7 kg)   Body Composition  Body Fat %: 52.5 % Fat Mass (lbs): 137 lbs Muscle Mass (lbs): 117.6 lbs Visceral Fat Rating : 17   Other Clinical Data Today's Visit #: 18 Starting Date: 06/18/22     ASSESSMENT AND PLAN:  Diet: Minnah is currently in the action stage of change. As such, her goal is to continue with weight loss efforts. She has agreed to [employer].  Exercise: Laressa has been instructed that some exercise is better than none for weight loss and overall health benefits.   Behavior Modification:  We discussed the following Behavioral Modification Strategies today: increasing lean protein intake, no skipping meals, and meal planning and cooking strategies.   No follow-ups on file.Marland Kitchen She was informed of the importance of frequent follow up visits to maximize her success with intensive lifestyle modifications for her multiple health conditions.  Attestation  Statements:   Reviewed by clinician on day of visit: allergies, medications, problem list, medical history, surgical history, family history, social history, and previous encounter notes.   Time spent on visit including pre-visit chart review and post-visit care and charting was 30 minutes.    Reuben Likes, MD

## 2023-02-06 NOTE — Assessment & Plan Note (Signed)
On Crestor daily.  No myalgias or transaminitis.  Diabetes better controlled but not at goal yet. Last LDL at goal.  Repeat lipid panel ordered today.

## 2023-02-06 NOTE — Assessment & Plan Note (Signed)
On Rx Vitamin D replacement.  Last level in April was 45.6. Discussed importance of vitamin d supplementation.  Vitamin d supplementation has been shown to decrease fatigue, decrease risk of progression to insulin resistance and then prediabetes, decreases risk of falling in older age and can even assist in decreasing depressive symptoms in PTSD.   Prescription for Vitamin D sent in and lab ordered.

## 2023-02-07 ENCOUNTER — Other Ambulatory Visit (HOSPITAL_COMMUNITY): Payer: Self-pay

## 2023-02-07 LAB — COMPREHENSIVE METABOLIC PANEL
ALT: 31 [IU]/L (ref 0–32)
AST: 28 [IU]/L (ref 0–40)
Albumin: 4.6 g/dL (ref 3.9–4.9)
Alkaline Phosphatase: 67 [IU]/L (ref 44–121)
BUN/Creatinine Ratio: 15 (ref 12–28)
BUN: 17 mg/dL (ref 8–27)
Bilirubin Total: 0.4 mg/dL (ref 0.0–1.2)
CO2: 23 mmol/L (ref 20–29)
Calcium: 9.5 mg/dL (ref 8.7–10.3)
Chloride: 100 mmol/L (ref 96–106)
Creatinine, Ser: 1.11 mg/dL — ABNORMAL HIGH (ref 0.57–1.00)
Globulin, Total: 2.1 g/dL (ref 1.5–4.5)
Glucose: 74 mg/dL (ref 70–99)
Potassium: 4.4 mmol/L (ref 3.5–5.2)
Sodium: 138 mmol/L (ref 134–144)
Total Protein: 6.7 g/dL (ref 6.0–8.5)
eGFR: 57 mL/min/{1.73_m2} — ABNORMAL LOW (ref >59)

## 2023-02-07 LAB — LIPID PANEL WITH LDL/HDL RATIO
Cholesterol, Total: 150 mg/dL (ref 100–199)
HDL: 75 mg/dL (ref >39)
LDL Chol Calc (NIH): 58 mg/dL (ref 0–99)
LDL/HDL Ratio: 0.8 ratio (ref 0.0–3.2)
Triglycerides: 95 mg/dL (ref 0–149)
VLDL Cholesterol Cal: 17 mg/dL (ref 5–40)

## 2023-02-07 LAB — HEMOGLOBIN A1C
Est. average glucose Bld gHb Est-mCnc: 117 mg/dL
Hgb A1c MFr Bld: 5.7 % — ABNORMAL HIGH (ref 4.8–5.6)

## 2023-02-07 LAB — INSULIN, RANDOM: INSULIN: 16 u[IU]/mL (ref 2.6–24.9)

## 2023-02-07 LAB — VITAMIN D 25 HYDROXY (VIT D DEFICIENCY, FRACTURES): Vit D, 25-Hydroxy: 54.6 ng/mL (ref 30.0–100.0)

## 2023-03-05 ENCOUNTER — Other Ambulatory Visit (HOSPITAL_COMMUNITY): Payer: Self-pay

## 2023-03-06 ENCOUNTER — Other Ambulatory Visit (HOSPITAL_COMMUNITY): Payer: Self-pay

## 2023-03-06 MED ORDER — AMITRIPTYLINE HCL 25 MG PO TABS
25.0000 mg | ORAL_TABLET | Freq: Every day | ORAL | 0 refills | Status: DC
  Filled 2023-03-06: qty 180, 90d supply, fill #0

## 2023-03-08 ENCOUNTER — Other Ambulatory Visit (INDEPENDENT_AMBULATORY_CARE_PROVIDER_SITE_OTHER): Payer: Self-pay | Admitting: Family Medicine

## 2023-03-08 ENCOUNTER — Other Ambulatory Visit (HOSPITAL_COMMUNITY): Payer: Self-pay

## 2023-03-08 DIAGNOSIS — E1165 Type 2 diabetes mellitus with hyperglycemia: Secondary | ICD-10-CM

## 2023-03-08 MED ORDER — VALACYCLOVIR HCL 1 G PO TABS
2000.0000 mg | ORAL_TABLET | Freq: Two times a day (BID) | ORAL | 1 refills | Status: DC
  Filled 2023-03-08: qty 24, 6d supply, fill #0
  Filled 2023-10-21: qty 24, 6d supply, fill #1

## 2023-03-11 ENCOUNTER — Other Ambulatory Visit (HOSPITAL_COMMUNITY): Payer: Self-pay

## 2023-03-11 ENCOUNTER — Encounter (HOSPITAL_COMMUNITY): Payer: Self-pay

## 2023-03-12 ENCOUNTER — Ambulatory Visit (INDEPENDENT_AMBULATORY_CARE_PROVIDER_SITE_OTHER): Payer: Commercial Managed Care - PPO | Admitting: Family Medicine

## 2023-03-12 ENCOUNTER — Other Ambulatory Visit (HOSPITAL_COMMUNITY): Payer: Self-pay

## 2023-03-12 VITALS — BP 129/81 | HR 90 | Temp 98.9°F | Ht 68.0 in | Wt 256.0 lb

## 2023-03-12 DIAGNOSIS — Z6839 Body mass index (BMI) 39.0-39.9, adult: Secondary | ICD-10-CM

## 2023-03-12 DIAGNOSIS — E559 Vitamin D deficiency, unspecified: Secondary | ICD-10-CM | POA: Diagnosis not present

## 2023-03-12 DIAGNOSIS — E1165 Type 2 diabetes mellitus with hyperglycemia: Secondary | ICD-10-CM | POA: Diagnosis not present

## 2023-03-12 DIAGNOSIS — Z7985 Long-term (current) use of injectable non-insulin antidiabetic drugs: Secondary | ICD-10-CM

## 2023-03-12 DIAGNOSIS — E669 Obesity, unspecified: Secondary | ICD-10-CM

## 2023-03-12 MED ORDER — TIRZEPATIDE 10 MG/0.5ML ~~LOC~~ SOAJ
10.0000 mg | SUBCUTANEOUS | 0 refills | Status: DC
  Filled 2023-03-12: qty 2, 28d supply, fill #0

## 2023-03-12 NOTE — Progress Notes (Signed)
   SUBJECTIVE:  Chief Complaint: Obesity  Interim History: Since last appointment patient celebrated Thanksgiving going to her mother's house.  She ate leftovers for a few days after the holiday.  For this month patient is planning to go to her mother's house again.  She is watching the airlines and is considering going to [place] on Christmas. She has gotten back on plan since Thanksgiving holiday.   Barbara Trevino is here to discuss her progress with her obesity treatment plan. She is on the [employer] and states she is following her eating plan approximately 50 % of the time. She states she is walking.   OBJECTIVE: Visit Diagnoses: Problem List Items Addressed This Visit       Endocrine   Type 2 diabetes mellitus with hyperglycemia, without long-term current use of insulin (HCC) - Primary    Patient doing well on Mounjaro 10mg  dose.  She is hoping to maintain satiety during the holiday season.  She is experiencing more food availability at work and has been able to be in control of her choice to indulge or not indulge.  She needs a refill of the [facility] today.      Relevant Medications   tirzepatide (MOUNJARO) 10 MG/0.5ML Pen     Other   Vitamin D deficiency    Patient on Vitamin D Rx.  Last Vitamin D level close to goal.  Can stop Rx and start OTC of 5k international units daily.  She denies nausea, vomiting and muscle weakness.        Obesity with starting BMI of 45.0   Relevant Medications   tirzepatide (MOUNJARO) 10 MG/0.5ML Pen   Other Visit Diagnoses     BMI 39.0-39.9,adult           Vitals Temp: 98.9 F (37.2 C) BP: 129/81 Pulse Rate: 90 SpO2: 98 %   Anthropometric Measurements Height: 5\' 8"  (1.727 m) Weight: 256 lb (116.1 kg) BMI (Calculated): 38.93 Weight at Last Visit: 260 lb Weight Lost Since Last Visit: 4 Weight Gained Since Last Visit: 0 Starting Weight: 288 lb Total Weight Loss (lbs): 32 lb (14.5 kg)   Body Composition  Body Fat %: 51.6 % Fat  Mass (lbs): 132.4 lbs Muscle Mass (lbs): 117.8 lbs Total Body Water (lbs): 96 lbs Visceral Fat Rating : 16   Other Clinical Data Today's Visit #: 51 Starting Date: 06/18/22     ASSESSMENT AND PLAN:  Diet: Bibi is currently in the action stage of change. As such, her goal is to continue with weight loss efforts. She has agreed to [employer].  Exercise: Chelsey has been instructed that some exercise is better than none for weight loss and overall health benefits.   Behavior Modification:  We discussed the following Behavioral Modification Strategies today: increasing lean protein intake, increasing vegetables, meal planning and cooking strategies, and holiday eating strategies.   No follow-ups on file.Marland Kitchen She was informed of the importance of frequent follow up visits to maximize her success with intensive lifestyle modifications for her multiple health conditions.  Attestation Statements:   Reviewed by clinician on day of visit: allergies, medications, problem list, medical history, surgical history, family history, social history, and previous encounter notes.      Rudean Hitt, MD

## 2023-03-12 NOTE — Assessment & Plan Note (Signed)
Patient on Vitamin D Rx.  Last Vitamin D level close to goal.  Can stop Rx and start OTC of 5k international units daily.  She denies nausea, vomiting and muscle weakness.

## 2023-03-12 NOTE — Assessment & Plan Note (Signed)
Patient doing well on Mounjaro 10mg  dose.  She is hoping to maintain satiety during the holiday season.  She is experiencing more food availability at work and has been able to be in control of her choice to indulge or not indulge.  She needs a refill of the [facility] today.

## 2023-03-15 ENCOUNTER — Other Ambulatory Visit (HOSPITAL_COMMUNITY): Payer: Self-pay

## 2023-04-06 ENCOUNTER — Encounter (INDEPENDENT_AMBULATORY_CARE_PROVIDER_SITE_OTHER): Payer: Self-pay | Admitting: Family Medicine

## 2023-04-08 ENCOUNTER — Other Ambulatory Visit (INDEPENDENT_AMBULATORY_CARE_PROVIDER_SITE_OTHER): Payer: Self-pay

## 2023-04-08 ENCOUNTER — Other Ambulatory Visit (HOSPITAL_COMMUNITY): Payer: Self-pay

## 2023-04-08 DIAGNOSIS — E1165 Type 2 diabetes mellitus with hyperglycemia: Secondary | ICD-10-CM

## 2023-04-08 MED ORDER — TIRZEPATIDE 10 MG/0.5ML ~~LOC~~ SOAJ
10.0000 mg | SUBCUTANEOUS | 0 refills | Status: DC
  Filled 2023-04-08: qty 2, 28d supply, fill #0

## 2023-04-11 ENCOUNTER — Other Ambulatory Visit: Payer: Self-pay | Admitting: General Surgery

## 2023-04-11 DIAGNOSIS — Z1231 Encounter for screening mammogram for malignant neoplasm of breast: Secondary | ICD-10-CM

## 2023-04-17 ENCOUNTER — Ambulatory Visit (INDEPENDENT_AMBULATORY_CARE_PROVIDER_SITE_OTHER): Payer: Commercial Managed Care - PPO | Admitting: Family Medicine

## 2023-04-17 ENCOUNTER — Other Ambulatory Visit (HOSPITAL_COMMUNITY): Payer: Self-pay

## 2023-04-17 ENCOUNTER — Encounter (INDEPENDENT_AMBULATORY_CARE_PROVIDER_SITE_OTHER): Payer: Self-pay | Admitting: Family Medicine

## 2023-04-17 VITALS — BP 128/61 | HR 89 | Temp 98.7°F | Ht 68.0 in | Wt 254.0 lb

## 2023-04-17 DIAGNOSIS — H04123 Dry eye syndrome of bilateral lacrimal glands: Secondary | ICD-10-CM | POA: Diagnosis not present

## 2023-04-17 DIAGNOSIS — Z6838 Body mass index (BMI) 38.0-38.9, adult: Secondary | ICD-10-CM

## 2023-04-17 DIAGNOSIS — H25813 Combined forms of age-related cataract, bilateral: Secondary | ICD-10-CM | POA: Diagnosis not present

## 2023-04-17 DIAGNOSIS — E559 Vitamin D deficiency, unspecified: Secondary | ICD-10-CM | POA: Diagnosis not present

## 2023-04-17 DIAGNOSIS — Z7985 Long-term (current) use of injectable non-insulin antidiabetic drugs: Secondary | ICD-10-CM | POA: Diagnosis not present

## 2023-04-17 DIAGNOSIS — E669 Obesity, unspecified: Secondary | ICD-10-CM

## 2023-04-17 DIAGNOSIS — E1165 Type 2 diabetes mellitus with hyperglycemia: Secondary | ICD-10-CM | POA: Diagnosis not present

## 2023-04-17 DIAGNOSIS — H40013 Open angle with borderline findings, low risk, bilateral: Secondary | ICD-10-CM | POA: Diagnosis not present

## 2023-04-17 MED ORDER — TIRZEPATIDE 10 MG/0.5ML ~~LOC~~ SOAJ
10.0000 mg | SUBCUTANEOUS | 0 refills | Status: DC
  Filled 2023-04-17 – 2023-05-01 (×2): qty 2, 28d supply, fill #0

## 2023-04-17 NOTE — Assessment & Plan Note (Signed)
 Patient just had dilated eye exam today- sees PA at [facility].  She was told to come back in within a few months to have visual fields examined to assess for glaucoma.  She mentions her pressures today were normal.  She is on Mounjaro  10mg  without any complications.  She needs a refill today.  A1c of 5.7 in November.

## 2023-04-17 NOTE — Progress Notes (Signed)
   SUBJECTIVE:  Chief Complaint: Obesity  Interim History: Patient had a good holiday- went to [place] to see grandbaby.  Unfortunately grandbaby got sick and was hospitalized for 3 days due to RSV.  Work is very busy as well. Over the holidays she didn't feel like she ate much and didn't fall off plan.  She recognizes she has been eating mostly on plan. Hasn't thought much about physical activity.  No plans for the next few weeks.    Barbara Trevino is here to discuss her progress with her obesity treatment plan. She is on the [employer] and states she is following her eating plan approximately 80 % of the time. She states she is not exercising much.   OBJECTIVE: Visit Diagnoses: Problem List Items Addressed This Visit       Endocrine   Type 2 diabetes mellitus with hyperglycemia, without long-term current use of insulin  [facility])   Patient just had dilated eye exam today- sees PA at [facility].  She was told to come back in within a few months to have visual fields examined to assess for glaucoma.  She mentions her pressures today were normal.  She is on Mounjaro  10mg  without any complications.  She needs a refill today.  A1c of 5.7 in November.      Relevant Medications   tirzepatide  (MOUNJARO ) 10 MG/0.5ML Pen     Other   Vitamin D  deficiency - Primary   Last Vitamin D  level of 54 in November.  No nausea, vomiting or muscle weakness.  Patient now on OTC.       Vitals Temp: 98.7 F (37.1 C) BP: 128/61 Pulse Rate: 89 SpO2: 100 %   Anthropometric Measurements Height: 5\' 8"  (1.727 m) Weight: 254 lb (115.2 kg) BMI (Calculated): 38.63 Weight at Last Visit: 256 lb Weight Lost Since Last Visit: 2 Weight Gained Since Last Visit: 0 Starting Weight: 288 lb Total Weight Loss (lbs): 34 lb (15.4 kg)   Body Composition  Body Fat %: 51.7 % Fat Mass (lbs): 131.6 lbs Muscle Mass (lbs): 116.6 lbs Total Body Water (lbs): 96.2 lbs Visceral Fat Rating : 16   Other Clinical  Data Today's Visit #: 20 Starting Date: 06/18/22     ASSESSMENT AND PLAN:  Diet: Chelly is currently in the action stage of change. As such, her goal is to continue with weight loss efforts. She has agreed to [employer].  Exercise: Shemeika has been instructed that some exercise is better than none for weight loss and overall health benefits.   Behavior Modification:  We discussed the following Behavioral Modification Strategies today: increasing lean protein intake, increasing vegetables, no skipping meals, meal planning and cooking strategies, and better snacking choices.   No follow-ups on file.Aaron Aas She was informed of the importance of frequent follow up visits to maximize her success with intensive lifestyle modifications for her multiple health conditions.  Attestation Statements:   Reviewed by clinician on day of visit: allergies, medications, problem list, medical history, surgical history, family history, social history, and previous encounter notes.      Donaciano Frizzle, MD

## 2023-04-17 NOTE — Assessment & Plan Note (Signed)
 Last Vitamin D  level of 54 in November.  No nausea, vomiting or muscle weakness.  Patient now on OTC.

## 2023-04-29 ENCOUNTER — Ambulatory Visit
Admission: RE | Admit: 2023-04-29 | Discharge: 2023-04-29 | Disposition: A | Discharge status: Home / Self Care | Payer: Commercial Managed Care - PPO | Source: Ambulatory Visit | Attending: General Surgery | Admitting: General Surgery

## 2023-04-29 DIAGNOSIS — Z1231 Encounter for screening mammogram for malignant neoplasm of breast: Secondary | ICD-10-CM

## 2023-04-30 ENCOUNTER — Other Ambulatory Visit (HOSPITAL_COMMUNITY): Payer: Self-pay

## 2023-04-30 DIAGNOSIS — H04123 Dry eye syndrome of bilateral lacrimal glands: Secondary | ICD-10-CM | POA: Diagnosis not present

## 2023-04-30 DIAGNOSIS — H40013 Open angle with borderline findings, low risk, bilateral: Secondary | ICD-10-CM | POA: Diagnosis not present

## 2023-04-30 DIAGNOSIS — H25813 Combined forms of age-related cataract, bilateral: Secondary | ICD-10-CM | POA: Diagnosis not present

## 2023-05-01 ENCOUNTER — Other Ambulatory Visit (HOSPITAL_COMMUNITY): Payer: Self-pay

## 2023-05-01 MED ORDER — CYCLOBENZAPRINE HCL 10 MG PO TABS
10.0000 mg | ORAL_TABLET | Freq: Three times a day (TID) | ORAL | 1 refills | Status: DC
  Filled 2023-05-01: qty 90, 30d supply, fill #0
  Filled 2023-05-30: qty 90, 30d supply, fill #1

## 2023-05-02 ENCOUNTER — Other Ambulatory Visit (HOSPITAL_COMMUNITY): Payer: Self-pay

## 2023-05-02 MED ORDER — METHYLPREDNISOLONE 4 MG PO TABS
ORAL_TABLET | ORAL | 0 refills | Status: AC
  Filled 2023-05-02: qty 21, 6d supply, fill #0

## 2023-05-15 ENCOUNTER — Ambulatory Visit (INDEPENDENT_AMBULATORY_CARE_PROVIDER_SITE_OTHER): Payer: Commercial Managed Care - PPO | Admitting: Family Medicine

## 2023-05-15 ENCOUNTER — Other Ambulatory Visit (HOSPITAL_COMMUNITY): Payer: Self-pay

## 2023-05-15 ENCOUNTER — Encounter (INDEPENDENT_AMBULATORY_CARE_PROVIDER_SITE_OTHER): Payer: Self-pay | Admitting: Family Medicine

## 2023-05-15 VITALS — BP 133/74 | HR 92 | Temp 97.6°F | Ht 68.0 in | Wt 250.0 lb

## 2023-05-15 DIAGNOSIS — E669 Obesity, unspecified: Secondary | ICD-10-CM

## 2023-05-15 DIAGNOSIS — E1122 Type 2 diabetes mellitus with diabetic chronic kidney disease: Secondary | ICD-10-CM

## 2023-05-15 DIAGNOSIS — Z8551 Personal history of malignant neoplasm of bladder: Secondary | ICD-10-CM | POA: Diagnosis not present

## 2023-05-15 DIAGNOSIS — N3946 Mixed incontinence: Secondary | ICD-10-CM | POA: Diagnosis not present

## 2023-05-15 DIAGNOSIS — Z6838 Body mass index (BMI) 38.0-38.9, adult: Secondary | ICD-10-CM

## 2023-05-15 DIAGNOSIS — E1165 Type 2 diabetes mellitus with hyperglycemia: Secondary | ICD-10-CM | POA: Diagnosis not present

## 2023-05-15 DIAGNOSIS — N1831 Chronic kidney disease, stage 3a: Secondary | ICD-10-CM

## 2023-05-15 DIAGNOSIS — Z7985 Long-term (current) use of injectable non-insulin antidiabetic drugs: Secondary | ICD-10-CM | POA: Diagnosis not present

## 2023-05-15 DIAGNOSIS — N390 Urinary tract infection, site not specified: Secondary | ICD-10-CM | POA: Diagnosis not present

## 2023-05-15 DIAGNOSIS — B951 Streptococcus, group B, as the cause of diseases classified elsewhere: Secondary | ICD-10-CM | POA: Diagnosis not present

## 2023-05-15 MED ORDER — CEFDINIR 300 MG PO CAPS
300.0000 mg | ORAL_CAPSULE | Freq: Two times a day (BID) | ORAL | 0 refills | Status: DC
  Filled 2023-05-15: qty 10, 5d supply, fill #0

## 2023-05-15 MED ORDER — TIRZEPATIDE 10 MG/0.5ML ~~LOC~~ SOAJ
10.0000 mg | SUBCUTANEOUS | 0 refills | Status: DC
  Filled 2023-05-15 – 2023-05-30 (×2): qty 2, 28d supply, fill #0

## 2023-05-15 MED ORDER — MIRABEGRON ER 50 MG PO TB24
50.0000 mg | ORAL_TABLET | Freq: Every day | ORAL | 3 refills | Status: AC
  Filled 2023-05-15: qty 90, 365d supply, fill #0
  Filled 2023-07-16: qty 90, 90d supply, fill #0
  Filled 2023-10-09: qty 90, 90d supply, fill #1
  Filled 2024-01-13: qty 90, 90d supply, fill #2
  Filled 2024-04-05 – 2024-04-06 (×3): qty 90, 90d supply, fill #3

## 2023-05-15 NOTE — Progress Notes (Signed)
[name], D.O.  ABFM, ABOM Specializing in Clinical Bariatric Medicine  Office located at: [address] [place], [place]  [address]   Assessment and Plan:   FOR THE DISEASE OF OBESITY: Obesity with starting BMI of 45.0 BMI 38.0-38.9,adult Assessment & Plan: Since last office visit on 04/17/23 with Dr. Gus Rankin patient's muscle mass has increased by 1.6lb. Fat mass has decreased by 5.6 lb. Total body water has decreased by 4.6 lb.  Counseling done on how various foods will affect these numbers and how to maximize success  Total lbs lost to date: 38 lbs  Total weight loss percentage to date: -13.19%    Recommended Dietary Goals Barbara Trevino is currently in the action stage of change. As such, her goal is to continue weight management plan.  She has agreed to: continue current plan   Behavioral Intervention We discussed the following today: increasing lean protein intake to established goals, decreasing simple carbohydrates , increasing water intake , and continue to work on maintaining a reduced calorie state, getting the recommended amount of protein, incorporating whole foods, making healthy choices, staying well hydrated and practicing mindfulness when eating.  Additional resources provided today: None  Evidence-based interventions for health behavior change were utilized today including the discussion of self monitoring techniques, problem-solving barriers and SMART goal setting techniques.   Regarding patient's less desirable eating habits and patterns, we employed the technique of small changes.   Pt will specifically work on: Increase protein intake for next visit.    Recommended Physical Activity Goals Barbara Trevino has been advised to work up to 150 minutes of moderate intensity aerobic activity a week and strengthening exercises 2-3 times per week for cardiovascular health, weight loss maintenance and preservation of muscle mass.   She has agreed to :  Think about  enjoyable ways to increase daily physical activity and overcoming barriers to exercise and Increase physical activity in their day and reduce sedentary time (increase NEAT).   Pharmacotherapy We both agreed to : continue with nutritional and behavioral strategies and adequate clinical response to current dose, continue current regimen   FOR ASSOCIATED CONDITIONS ADDRESSED TODAY: Type 2 diabetes mellitus with hyperglycemia, without long-term current use of insulin [facility]) Assessment & Plan: Lab Results  Component Value Date   HGBA1C 5.7 (H) 02/06/2023   HGBA1C 6.1 (H) 07/17/2022   HGBA1C 7.1 (H) 10/31/2021   INSULIN 16.0 02/06/2023   INSULIN 38.0 (H) 07/17/2022   INSULIN 39.0 (H) 10/31/2021    Last A1C was 5.7, improved from prior result of 6.1. Pt is on Monujaro 10 mg once weekly with good compliance. Tolerating well, no SE. She has been drinking protein shakes, eating protein shakes, and eating proteins at meal times in an effort to meet her protein intake daily.   Increase protein intake daily and decrease simple carb/sugars intake --> follow her meal plan. Reviewed alternative sources of lean protein other than meats. Continue with current medication regimen as prescribed. Will refill Mounjaro today, no changes made.   Orders: - Refill Mounjaro, no dose changes   Stage 3a chronic kidney disease (HCC) Lab Results  Component Value Date   CREATININE 1.11 (H) 02/06/2023   Last serum creatinine was slightly elevated, prior labs were 124. Pt eGFR was below goal at 57.   Reviewed goal eGFR of 60 or more. Encouraged pt to drink more water and increase exercise. Continue to follow up with PCP/specialists as directed by them.    Follow up:   Return  in about 4 weeks (around 06/12/2023). She was informed of the importance of frequent follow up visits to maximize her success with intensive lifestyle modifications for her multiple health conditions.  Subjective:   Chief complaint:  Obesity Barbara Trevino is here to discuss her progress with her obesity treatment plan. She is on the [employer] and states she is following her eating plan approximately 70% of the time. She states she is walking 20 minutes 3-4 days per week.  Interval History:  Barbara Trevino is here for a follow up office visit. This is my first encounter with patient. Since last OV on 04/17/23 with Dr. Gus Rankin, she has lost 4 lbs. Pt reports she likes to eat more when it's cold outside. Occasionally she has sweets, but overall she has been limiting her consumption of sweets. Has been prioritizing proteins. Doesn't snack much but when she does, she enjoys pretzels, cheese sticks, and apples. If she eats enough of her protein she tends to feel full.  Pharmacotherapy for weight loss: She is currently taking Monjauro with diabetes as the primary indication with adequate clinical response  and without side effects..   Review of Systems:  Pertinent positives were addressed with patient today.  Reviewed by clinician on day of visit: allergies, medications, problem list, medical history, surgical history, family history, social history, and previous encounter notes.  Weight Summary and Biometrics   Weight Lost Since Last Visit: 4lb  Weight Gained Since Last Visit: 0    Vitals Temp: 97.6 F (36.4 C) BP: 133/74 Pulse Rate: 92 SpO2: 99 %   Anthropometric Measurements Height: 5\' 8"  (1.727 m) Weight: 250 lb (113.4 kg) BMI (Calculated): 38.02 Weight at Last Visit: 254lb Weight Lost Since Last Visit: 4lb Weight Gained Since Last Visit: 0 Starting Weight: 288lb Total Weight Loss (lbs): 38 lb (17.2 kg)   Body Composition  Body Fat %: 50.3 % Fat Mass (lbs): 126 lbs Muscle Mass (lbs): 118.2 lbs Total Body Water (lbs): 91.6 lbs Visceral Fat Rating : 15   Other Clinical Data Fasting: no Labs: no Today's Visit #: 21 Starting Date: 06/18/22    Objective:   PHYSICAL EXAM: Blood pressure 133/74,  pulse 92, temperature 97.6 F (36.4 C), height 5\' 8"  (1.727 m), weight 250 lb (113.4 kg), SpO2 99%. Body mass index is 38.01 kg/m.  General: she is overweight, cooperative and in no acute distress. PSYCH: Has normal mood, affect and thought process.   HEENT: EOMI, sclerae are anicteric. Lungs: Normal breathing effort, no conversational dyspnea. Extremities: Moves * 4 Neurologic: A and O * 3, good insight  DIAGNOSTIC DATA REVIEWED: BMET    Component Value Date/Time   NA 138 02/06/2023 1051   K 4.4 02/06/2023 1051   CL 100 02/06/2023 1051   CO2 23 02/06/2023 1051   GLUCOSE 74 02/06/2023 1051   GLUCOSE 98 01/04/2011 0919   BUN 17 02/06/2023 1051   CREATININE 1.11 (H) 02/06/2023 1051   CALCIUM 9.5 02/06/2023 1051   GFRNONAA 58 (L) 06/18/2018 1356   GFRAA 67 06/18/2018 1356   Lab Results  Component Value Date   HGBA1C 5.7 (H) 02/06/2023   HGBA1C 6.3 (H) 06/18/2018   Lab Results  Component Value Date   INSULIN 16.0 02/06/2023   INSULIN 32.2 (H) 06/18/2018   Lab Results  Component Value Date   TSH 1.200 10/31/2021   CBC    Component Value Date/Time   WBC 5.5 10/31/2021 1006   WBC 3.9 01/04/2011 0919   WBC 4.2 06/15/2009  1140   RBC 4.50 10/31/2021 1006   RBC 4.03 01/04/2011 0919   RBC 3.88 06/15/2009 1140   HGB 13.3 10/31/2021 1006   HGB 12.3 01/04/2011 0919   HCT 39.6 10/31/2021 1006   HCT 35.5 01/04/2011 0919   PLT 230 10/31/2021 1006   MCV 88 10/31/2021 1006   MCV 88.0 01/04/2011 0919   MCH 29.6 10/31/2021 1006   MCH 30.5 01/04/2011 0919   MCHC 33.6 10/31/2021 1006   MCHC 34.6 01/04/2011 0919   MCHC 33.8 06/15/2009 1140   RDW 13.9 10/31/2021 1006   RDW 14.1 01/04/2011 0919   Iron Studies No results found for: "IRON", "TIBC", "FERRITIN", "IRONPCTSAT" Lipid Panel     Component Value Date/Time   CHOL 150 02/06/2023 1051   TRIG 95 02/06/2023 1051   HDL 75 02/06/2023 1051   LDLCALC 58 02/06/2023 1051   Hepatic Function Panel     Component Value  Date/Time   PROT 6.7 02/06/2023 1051   ALBUMIN 4.6 02/06/2023 1051   AST 28 02/06/2023 1051   ALT 31 02/06/2023 1051   ALKPHOS 67 02/06/2023 1051   BILITOT 0.4 02/06/2023 1051      Component Value Date/Time   TSH 1.200 10/31/2021 1006   Nutritional Lab Results  Component Value Date   VD25OH 54.6 02/06/2023   VD25OH 45.6 07/17/2022   VD25OH 34.8 10/31/2021    Attestations:   I, Isabelle Course, acting as a Stage manager for Thomasene Lot, DO., have compiled all relevant documentation for today's office visit on behalf of Thomasene Lot, DO, while in the presence of [employer], DO.  Reviewed by clinician on day of visit: allergies, medications, problem list, medical history, surgical history, family history, social history, and previous encounter notes pertinent to patient's obesity diagnosis.  I have reviewed the above documentation for accuracy and completeness, and I agree with the above. [name], D.O.  The 21st Century Cures Act was signed into law in 2016 which includes the topic of electronic health records.  This provides immediate access to information in MyChart.  This includes consultation notes, operative notes, office notes, lab results and pathology reports.  If you have any questions about what you read please let us know at your next visit so we can discuss your concerns and take corrective action if need be.  We are right here with you.

## 2023-05-16 DIAGNOSIS — M25511 Pain in right shoulder: Secondary | ICD-10-CM | POA: Diagnosis not present

## 2023-05-30 ENCOUNTER — Other Ambulatory Visit (HOSPITAL_COMMUNITY): Payer: Self-pay

## 2023-05-30 MED ORDER — AMITRIPTYLINE HCL 25 MG PO TABS
25.0000 mg | ORAL_TABLET | Freq: Every day | ORAL | 0 refills | Status: DC
  Filled 2023-05-30: qty 180, 90d supply, fill #0

## 2023-06-17 ENCOUNTER — Ambulatory Visit (INDEPENDENT_AMBULATORY_CARE_PROVIDER_SITE_OTHER): Payer: Commercial Managed Care - PPO | Admitting: Family Medicine

## 2023-06-17 ENCOUNTER — Encounter (INDEPENDENT_AMBULATORY_CARE_PROVIDER_SITE_OTHER): Payer: Self-pay | Admitting: Family Medicine

## 2023-06-17 ENCOUNTER — Other Ambulatory Visit (HOSPITAL_COMMUNITY): Payer: Self-pay

## 2023-06-17 VITALS — BP 119/77 | HR 85 | Ht 68.0 in | Wt 246.0 lb

## 2023-06-17 DIAGNOSIS — E1169 Type 2 diabetes mellitus with other specified complication: Secondary | ICD-10-CM | POA: Diagnosis not present

## 2023-06-17 DIAGNOSIS — E785 Hyperlipidemia, unspecified: Secondary | ICD-10-CM | POA: Diagnosis not present

## 2023-06-17 DIAGNOSIS — E1165 Type 2 diabetes mellitus with hyperglycemia: Secondary | ICD-10-CM | POA: Diagnosis not present

## 2023-06-17 DIAGNOSIS — Z7985 Long-term (current) use of injectable non-insulin antidiabetic drugs: Secondary | ICD-10-CM

## 2023-06-17 DIAGNOSIS — E669 Obesity, unspecified: Secondary | ICD-10-CM

## 2023-06-17 DIAGNOSIS — Z6837 Body mass index (BMI) 37.0-37.9, adult: Secondary | ICD-10-CM | POA: Diagnosis not present

## 2023-06-17 MED ORDER — TIRZEPATIDE 10 MG/0.5ML ~~LOC~~ SOAJ
10.0000 mg | SUBCUTANEOUS | 0 refills | Status: DC
  Filled 2023-06-17 – 2023-06-24 (×2): qty 6, 84d supply, fill #0

## 2023-06-17 NOTE — Progress Notes (Unsigned)
   SUBJECTIVE:  Chief Complaint: Obesity  Interim History: Patient has had a rough month.  One week she forgot her injection.  She last saw Dr. Sharee Holster.  She has been trying to stick to the plan consistently except for the week she missed her Mounjaro.  She did notice an increase in hunger that week. She is traveling to [place] in April and then her family is moving to [place] so she thinks this may be her last time traveling there.    Barbara Trevino is here to discuss her progress with her obesity treatment plan. She is on the [employer] and states she is following her eating plan approximately 60-70 % of the time. She states she is not exercising.   OBJECTIVE: Visit Diagnoses: Problem List Items Addressed This Visit   None   Vitals BP: 119/77 Pulse Rate: 85 SpO2: 100 %   Anthropometric Measurements Height: 5\' 8"  (1.727 m) Weight: 246 lb (111.6 kg) BMI (Calculated): 37.41 Weight at Last Visit: 250 lb Weight Lost Since Last Visit: 4 Weight Gained Since Last Visit: 0 Starting Weight: 288 lb Total Weight Loss (lbs): 42 lb (19.1 kg) Peak Weight: 303 lb   Body Composition  Body Fat %: 50 % Fat Mass (lbs): 123.2 lbs Muscle Mass (lbs): 116.8 lbs Total Body Water (lbs): 91.4 lbs Visceral Fat Rating : 15   Other Clinical Data Today's Visit #: 22 Starting Date: 06/18/22 Comments: Pesc     ASSESSMENT AND PLAN:  Diet: Barbara Trevino is currently in the action stage of change. As such, her goal is to continue with weight loss efforts and has agreed to the [employer].   Exercise:  For substantial health benefits, adults should do at least 150 minutes (2 hours and 30 minutes) a week of moderate-intensity, or 75 minutes (1 hour and 15 minutes) a week of vigorous-intensity aerobic physical activity, or an equivalent combination of moderate- and vigorous-intensity aerobic activity. Aerobic activity should be performed in episodes of at least 10 minutes, and preferably, it should be  spread throughout the week. Patient to incorporate 10 minutes 3x a week.  Behavior Modification:  We discussed the following Behavioral Modification Strategies today: increasing lean protein intake, no skipping meals, meal planning and cooking strategies, and avoiding temptations.   No follow-ups on file.Marland Kitchen She was informed of the importance of frequent follow up visits to maximize her success with intensive lifestyle modifications for her multiple health conditions.  Attestation Statements:   Reviewed by clinician on day of visit: allergies, medications, problem list, medical history, surgical history, family history, social history, and previous encounter notes.   Time spent on visit including pre-visit chart review and post-visit care and charting was *** minutes  Reuben Likes, MD

## 2023-06-21 NOTE — Assessment & Plan Note (Signed)
 Anthropometric Measurements Height: 5\' 8"  (1.727 m) Weight: 246 lb (111.6 kg) BMI (Calculated): 37.41 Weight at Last Visit: 250 lb Weight Lost Since Last Visit: 4 Weight Gained Since Last Visit: 0 Starting Weight: 288 lb Total Weight Loss (lbs): 42 lb (19.1 kg) Peak Weight: 303 lb Body Composition  Body Fat %: 50 % Fat Mass (lbs): 123.2 lbs Muscle Mass (lbs): 116.8 lbs Total Body Water (lbs): 91.4 lbs Visceral Fat Rating : 15 Other Clinical Data Today's Visit #: 22 Starting Date: 06/18/22 Comments: Pesc

## 2023-06-21 NOTE — Assessment & Plan Note (Signed)
 Patient is doing very well on Mounjaro 10mg  weekly.  Will refill for 3 months and follow up in 6 weeks to see how she is doing.

## 2023-06-21 NOTE — Assessment & Plan Note (Signed)
 On Crestor 20mg  daily.  No myalgias or transaminitis.  Last LDL at goal.  Continue current treatment.

## 2023-06-24 ENCOUNTER — Other Ambulatory Visit (HOSPITAL_COMMUNITY): Payer: Self-pay

## 2023-06-25 ENCOUNTER — Other Ambulatory Visit (HOSPITAL_COMMUNITY): Payer: Self-pay

## 2023-06-28 ENCOUNTER — Other Ambulatory Visit (HOSPITAL_COMMUNITY): Payer: Self-pay

## 2023-06-28 MED ORDER — CEPHALEXIN 500 MG PO CAPS
500.0000 mg | ORAL_CAPSULE | Freq: Three times a day (TID) | ORAL | 0 refills | Status: DC
  Filled 2023-06-29: qty 15, 5d supply, fill #0
  Filled ????-??-??: fill #0

## 2023-06-29 ENCOUNTER — Other Ambulatory Visit (HOSPITAL_COMMUNITY): Payer: Self-pay

## 2023-07-16 ENCOUNTER — Other Ambulatory Visit (HOSPITAL_COMMUNITY): Payer: Self-pay

## 2023-07-17 ENCOUNTER — Other Ambulatory Visit (HOSPITAL_COMMUNITY): Payer: Self-pay

## 2023-07-22 ENCOUNTER — Other Ambulatory Visit (HOSPITAL_COMMUNITY): Payer: Self-pay

## 2023-07-22 MED ORDER — GABAPENTIN 300 MG PO CAPS
600.0000 mg | ORAL_CAPSULE | Freq: Every evening | ORAL | 2 refills | Status: AC
  Filled 2023-07-22: qty 180, 90d supply, fill #0
  Filled 2023-10-09: qty 180, 90d supply, fill #1
  Filled 2024-01-13: qty 180, 90d supply, fill #2

## 2023-07-22 MED ORDER — ROSUVASTATIN CALCIUM 20 MG PO TABS
20.0000 mg | ORAL_TABLET | Freq: Every day | ORAL | 1 refills | Status: DC
  Filled 2023-07-22: qty 90, 90d supply, fill #0
  Filled 2023-10-09: qty 90, 90d supply, fill #1

## 2023-07-29 ENCOUNTER — Ambulatory Visit (INDEPENDENT_AMBULATORY_CARE_PROVIDER_SITE_OTHER): Admitting: Family Medicine

## 2023-07-29 ENCOUNTER — Encounter (INDEPENDENT_AMBULATORY_CARE_PROVIDER_SITE_OTHER): Payer: Self-pay | Admitting: Family Medicine

## 2023-07-29 VITALS — BP 132/69 | HR 86 | Temp 97.7°F | Ht 68.0 in | Wt 244.0 lb

## 2023-07-29 DIAGNOSIS — E669 Obesity, unspecified: Secondary | ICD-10-CM | POA: Diagnosis not present

## 2023-07-29 DIAGNOSIS — E1165 Type 2 diabetes mellitus with hyperglycemia: Secondary | ICD-10-CM | POA: Diagnosis not present

## 2023-07-29 DIAGNOSIS — Z7985 Long-term (current) use of injectable non-insulin antidiabetic drugs: Secondary | ICD-10-CM | POA: Diagnosis not present

## 2023-07-29 DIAGNOSIS — Z6837 Body mass index (BMI) 37.0-37.9, adult: Secondary | ICD-10-CM

## 2023-07-29 NOTE — Assessment & Plan Note (Signed)
 Doing well on Mounjaro  10mg  weekly. Less satiety on medication currently.  No refill needed.  Follow up on dosage at next appointment.

## 2023-07-29 NOTE — Progress Notes (Signed)
   SUBJECTIVE:  Chief Complaint: Obesity  Interim History: Patient went to [facility] since her last appointment and saw her grandbaby.  She didn't eat as well as she wanted to while away but that was expected.  She has been walking more.  She is cleaning out the building with her kids.  No upcoming plans to go anywhere in the next month except for a few doctors appointments.   Barbara Trevino is here to discuss her progress with her obesity treatment plan. She is on the [employer] and states she is following her eating plan approximately 50 % of the time. She states she is walking more.   OBJECTIVE: Visit Diagnoses: Problem List Items Addressed This Visit       Endocrine   Type 2 diabetes mellitus with hyperglycemia, without long-term current use of insulin  (HCC) - Primary   Doing well on Mounjaro  10mg  weekly. Less satiety on medication currently.  No refill needed.  Follow up on dosage at next appointment.        Other   Obesity with starting BMI of 45.0   Other Visit Diagnoses       BMI 37.0-37.9, adult           Vitals Temp: 97.7 F (36.5 C) BP: 132/69 Pulse Rate: 86 SpO2: 100 %   Anthropometric Measurements Height: 5\' 8"  (1.727 m) Weight: 244 lb (110.7 kg) BMI (Calculated): 37.11 Weight at Last Visit: 246 lb Weight Lost Since Last Visit: 2 Weight Gained Since Last Visit: 0 Starting Weight: 288 lb Total Weight Loss (lbs): 44 lb (20 kg) Peak Weight: 303 lb   Body Composition  Body Fat %: 50.1 % Fat Mass (lbs): 122.6 lbs Muscle Mass (lbs): 115.8 lbs Total Body Water (lbs): 94.2 lbs Visceral Fat Rating : 15   Other Clinical Data Today's Visit #: 23 Starting Date: 06/18/22 Comments: Pesc     ASSESSMENT AND PLAN:  Diet: Barbara Trevino is currently in the action stage of change. As such, her goal is to continue with weight loss efforts and has agreed to the [employer].   Exercise:  For substantial health benefits, adults should do at least 150 minutes (2  hours and 30 minutes) a week of moderate-intensity, or 75 minutes (1 hour and 15 minutes) a week of vigorous-intensity aerobic physical activity, or an equivalent combination of moderate- and vigorous-intensity aerobic activity. Aerobic activity should be performed in episodes of at least 10 minutes, and preferably, it should be spread throughout the week.  Behavior Modification:  We discussed the following Behavioral Modification Strategies today: increasing lean protein intake, decreasing simple carbohydrates, meal planning and cooking strategies, avoiding temptations, and planning for success.   Return in about 4 weeks (around 08/26/2023).Aaron Aas She was informed of the importance of frequent follow up visits to maximize her success with intensive lifestyle modifications for her multiple health conditions.  Attestation Statements:   Reviewed by clinician on day of visit: allergies, medications, problem list, medical history, surgical history, family history, social history, and previous encounter notes.     Donaciano Frizzle, MD

## 2023-08-13 ENCOUNTER — Other Ambulatory Visit (HOSPITAL_COMMUNITY): Payer: Self-pay

## 2023-08-14 ENCOUNTER — Other Ambulatory Visit (HOSPITAL_COMMUNITY): Payer: Self-pay

## 2023-08-14 ENCOUNTER — Other Ambulatory Visit: Payer: Self-pay

## 2023-08-14 MED ORDER — VENLAFAXINE HCL ER 150 MG PO CP24
150.0000 mg | ORAL_CAPSULE | Freq: Every day | ORAL | 0 refills | Status: DC
  Filled 2023-08-14: qty 90, 90d supply, fill #0

## 2023-08-14 MED ORDER — AMITRIPTYLINE HCL 25 MG PO TABS
25.0000 mg | ORAL_TABLET | Freq: Every evening | ORAL | 0 refills | Status: DC
  Filled 2023-08-14: qty 90, 90d supply, fill #0
  Filled 2023-10-21 – 2023-10-29 (×2): qty 90, 90d supply, fill #1

## 2023-08-20 ENCOUNTER — Other Ambulatory Visit (HOSPITAL_COMMUNITY): Payer: Self-pay

## 2023-08-27 DIAGNOSIS — R232 Flushing: Secondary | ICD-10-CM | POA: Diagnosis not present

## 2023-08-27 DIAGNOSIS — Z23 Encounter for immunization: Secondary | ICD-10-CM | POA: Diagnosis not present

## 2023-08-27 DIAGNOSIS — E782 Mixed hyperlipidemia: Secondary | ICD-10-CM | POA: Diagnosis not present

## 2023-08-27 DIAGNOSIS — G620 Drug-induced polyneuropathy: Secondary | ICD-10-CM | POA: Diagnosis not present

## 2023-08-27 DIAGNOSIS — Z0189 Encounter for other specified special examinations: Secondary | ICD-10-CM | POA: Diagnosis not present

## 2023-08-27 DIAGNOSIS — B009 Herpesviral infection, unspecified: Secondary | ICD-10-CM | POA: Diagnosis not present

## 2023-08-27 DIAGNOSIS — K219 Gastro-esophageal reflux disease without esophagitis: Secondary | ICD-10-CM | POA: Diagnosis not present

## 2023-08-27 DIAGNOSIS — E1169 Type 2 diabetes mellitus with other specified complication: Secondary | ICD-10-CM | POA: Diagnosis not present

## 2023-08-27 DIAGNOSIS — E559 Vitamin D deficiency, unspecified: Secondary | ICD-10-CM | POA: Diagnosis not present

## 2023-08-27 LAB — LAB REPORT - SCANNED
Albumin, Urine POC: 1.93
Creatinine, POC: 137 mg/dL
Microalb Creat Ratio: 14.1

## 2023-08-27 LAB — HEMOGLOBIN A1C: A1c: 5.4

## 2023-08-27 LAB — COMPREHENSIVE METABOLIC PANEL WITH GFR: EGFR: 64

## 2023-09-04 ENCOUNTER — Ambulatory Visit (INDEPENDENT_AMBULATORY_CARE_PROVIDER_SITE_OTHER): Admitting: Family Medicine

## 2023-09-18 ENCOUNTER — Encounter (INDEPENDENT_AMBULATORY_CARE_PROVIDER_SITE_OTHER): Payer: Self-pay | Admitting: Family Medicine

## 2023-09-18 ENCOUNTER — Other Ambulatory Visit (HOSPITAL_COMMUNITY): Payer: Self-pay

## 2023-09-18 ENCOUNTER — Ambulatory Visit (INDEPENDENT_AMBULATORY_CARE_PROVIDER_SITE_OTHER): Admitting: Family Medicine

## 2023-09-18 VITALS — BP 101/61 | HR 79 | Temp 99.0°F | Ht 68.0 in | Wt 246.0 lb

## 2023-09-18 DIAGNOSIS — E669 Obesity, unspecified: Secondary | ICD-10-CM | POA: Diagnosis not present

## 2023-09-18 DIAGNOSIS — E1169 Type 2 diabetes mellitus with other specified complication: Secondary | ICD-10-CM | POA: Diagnosis not present

## 2023-09-18 DIAGNOSIS — Z7985 Long-term (current) use of injectable non-insulin antidiabetic drugs: Secondary | ICD-10-CM

## 2023-09-18 DIAGNOSIS — E1165 Type 2 diabetes mellitus with hyperglycemia: Secondary | ICD-10-CM

## 2023-09-18 DIAGNOSIS — Z6837 Body mass index (BMI) 37.0-37.9, adult: Secondary | ICD-10-CM

## 2023-09-18 DIAGNOSIS — E785 Hyperlipidemia, unspecified: Secondary | ICD-10-CM

## 2023-09-18 DIAGNOSIS — E559 Vitamin D deficiency, unspecified: Secondary | ICD-10-CM | POA: Diagnosis not present

## 2023-09-18 MED ORDER — TIRZEPATIDE 10 MG/0.5ML ~~LOC~~ SOAJ
10.0000 mg | SUBCUTANEOUS | 0 refills | Status: DC
  Filled 2023-09-18: qty 6, 84d supply, fill #0

## 2023-09-18 NOTE — Progress Notes (Signed)
 Barbara Trevino, D.O.  ABFM, ABOM Specializing in Clinical Bariatric Medicine  Office located at: [address] [place], [place]  [address]   Assessment and Plan:  No orders of the defined types were placed in this encounter.   Medications Discontinued During This Encounter  Medication Reason   tirzepatide  (MOUNJARO ) 10 MG/0.5ML Pen Reorder     Meds ordered this encounter  Medications   tirzepatide  (MOUNJARO ) 10 MG/0.5ML Pen    Sig: Inject 10 mg into the skin once a week.    Dispense:  6 mL    Refill:  0      FOR THE DISEASE OF OBESITY: BMI 37.0-37.9, adult -- Current BMI 37.41 Obesity with starting BMI of 45.0 Assessment & Plan: Since last office visit on 07/03/23 patient's muscle mass has increased by 1.2 lb. Fat mass has increased by 0.8 lbs. Total body water has increased by 0.6 lbs.  Counseling done on how various foods will affect these numbers and how to maximize success  Total lbs lost to date: 42 Total weight loss percentage to date: -14.58%    Recommended Dietary Goals Barbara Trevino is currently in the action stage of change. As such, her goal is to continue weight management plan.  She has agreed to: continue current plan   Behavioral Intervention We discussed the following today: increasing lean protein intake to established goals, decreasing simple carbohydrates , avoiding skipping meals, reading food labels , keeping healthy foods at home, work on managing stress, creating time for self-care and relaxation, avoiding temptations and identifying enticing environmental cues, better snacking choices, and focusing on food with a 10:1 ratio of calories: grams of protein  Additional resources provided today: None  Evidence-based interventions for health behavior change were utilized today including the discussion of self monitoring techniques, problem-solving barriers and SMART goal setting techniques.   Regarding patient's less desirable eating habits and  patterns, we employed the technique of small changes.   Pt will specifically work on: *** Ensuring she gets adequate amts of protein with each meal and find healthier snack alternatives for next visit.    Recommended Physical Activity Goals Barbara Trevino has been advised to work up to 300-450 minutes of moderate intensity aerobic activity a week and strengthening exercises 2-3 times per week for cardiovascular health, weight loss maintenance and preservation of muscle mass.   She has agreed to: continue to gradually increase the amount and intensity of exercise routine   Pharmacotherapy We both agreed to: Continue with current nutritional and behavioral strategies   ASSOCIATED CONDITIONS ADDRESSED TODAY: Type 2 diabetes mellitus with hyperglycemia, without long-term current use of insulin  [facility]) Assessment & Plan: Lab Results  Component Value Date   HGBA1C 5.7 (H) 02/06/2023   HGBA1C 6.1 (H) 07/17/2022   HGBA1C 7.1 (H) 10/31/2021   INSULIN  16.0 02/06/2023   INSULIN  38.0 (H) 07/17/2022   INSULIN  39.0 (H) 10/31/2021   Compliant with Mounjaro  10 mg once weekly. Tolerating well with no adverse SE. Most recent labs were obtained by PCP on 08/27/23. A1c was 5.4; improved from prior A1c of 7.1. CMP and CBC were also obtained and results were grossly normal. Continue with current regimen. Will refill Mounjaro  today, no dose changes. Decrease simple carb intake, increase protein intake at each meal, and avoid sugary snacks. Encouraged pt to work on [employer] alternatives.   Orders: - Refill Mounjaro , no changes   Hyperlipidemia associated with type 2 diabetes mellitus (HCC) Assessment & Plan: Lab Results  Component Value  Date   CHOL 150 02/06/2023   HDL 75 02/06/2023   LDLCALC 58 02/06/2023   TRIG 95 02/06/2023   Compliant with Crestor  20 mg once daily. Tolerating well with no SE. Reviewed labs obtained from PCP: LDL 60, TGs 120, and HDL 74.     Continue with current regimen.  Encouraged pt to avoid saturated and trans fats. Follow heart healthy meal plan and gradually increased exercise.      ***  Vitamin D  deficiency Assessment & Plan: Lab Results  Component Value Date   VD25OH 54.6 02/06/2023   VD25OH 45.6 07/17/2022   VD25OH 34.8 10/31/2021   Pt has a     She also has a hx of vit D deficiency  Prev took 2,000 IUs once daily vit D supplementation        ***    Follow up:   Return in about 6 weeks (around 10/30/2023) for 6 week f/u with Dr. Sherwin Donate. She was informed of the importance of frequent follow up visits to maximize her success with intensive lifestyle modifications for her multiple health conditions.  Subjective:   Chief complaint: Obesity Barbara Trevino is here to discuss her progress with her obesity treatment plan. She is on the [employer] and states she is following her eating plan approximately 80% of the time. She states she is walking 30 minutes 3 days per week.  Interval History:  Barbara Trevino is here for a follow up office visit. Since last OV on 07/29/23, she gained 2 lbs. She reports snacking on [employer] and admits to struggling to eat snacks that are on plan. She also endorses increased stress from work, which she also identifies as a reason for her weight gain.   Pharmacotherapy for weight loss: She is currently taking Monjauro with diabetes as the primary indication with adequate clinical response  and without side effects..   Review of Systems:  Pertinent positives were addressed with patient today.  Reviewed by clinician on day of visit: allergies, medications, problem list, medical history, surgical history, family history, social history, and previous encounter notes.  Weight Summary and Biometrics   Weight Lost Since Last Visit: 0lb  Weight Gained Since Last Visit: 2lb    Vitals Temp: 99 F (37.2 C) BP: 101/61 Pulse Rate: 79 SpO2: 98 %   Anthropometric Measurements Height: 5' 8 (1.727  m) Weight: 246 lb (111.6 kg) BMI (Calculated): 37.41 Weight at Last Visit: 244lb Weight Lost Since Last Visit: 0lb Weight Gained Since Last Visit: 2lb Starting Weight: 288lb Total Weight Loss (lbs): 42 lb (19.1 kg) Peak Weight: 303lb   Body Composition  Body Fat %: 50 % Fat Mass (lbs): 123.4 lbs Muscle Mass (lbs): 117 lbs Total Body Water (lbs): 94.8 lbs Visceral Fat Rating : 15   Other Clinical Data Fasting: No Labs: No Today's Visit #: 24 Starting Date: 06/18/22 Comments: Pesc    Objective:   PHYSICAL EXAM: Blood pressure 101/61, pulse 79, temperature 99 F (37.2 C), height 5' 8 (1.727 m), weight 246 lb (111.6 kg), SpO2 98%. Body mass index is 37.4 kg/m.  General: she is overweight, cooperative and in no acute distress. PSYCH: Has normal mood, affect and thought process.   HEENT: EOMI, sclerae are anicteric. Lungs: Normal breathing effort, no conversational dyspnea. Extremities: Moves * 4 Neurologic: A and O * 3, good insight  DIAGNOSTIC DATA REVIEWED: BMET    Component Value Date/Time   NA 138 02/06/2023 1051   K 4.4 02/06/2023 1051  CL 100 02/06/2023 1051   CO2 23 02/06/2023 1051   GLUCOSE 74 02/06/2023 1051   GLUCOSE 98 01/04/2011 0919   BUN 17 02/06/2023 1051   CREATININE 1.11 (H) 02/06/2023 1051   CALCIUM  9.5 02/06/2023 1051   GFRNONAA 58 (L) 06/18/2018 1356   GFRAA 67 06/18/2018 1356   Lab Results  Component Value Date   HGBA1C 5.7 (H) 02/06/2023   HGBA1C 6.3 (H) 06/18/2018   Lab Results  Component Value Date   INSULIN  16.0 02/06/2023   INSULIN  32.2 (H) 06/18/2018   Lab Results  Component Value Date   TSH 1.200 10/31/2021   CBC    Component Value Date/Time   WBC 5.5 10/31/2021 1006   WBC 3.9 01/04/2011 0919   WBC 4.2 06/15/2009 1140   RBC 4.50 10/31/2021 1006   RBC 4.03 01/04/2011 0919   RBC 3.88 06/15/2009 1140   HGB 13.3 10/31/2021 1006   HGB 12.3 01/04/2011 0919   HCT 39.6 10/31/2021 1006   HCT 35.5 01/04/2011 0919    PLT 230 10/31/2021 1006   MCV 88 10/31/2021 1006   MCV 88.0 01/04/2011 0919   MCH 29.6 10/31/2021 1006   MCH 30.5 01/04/2011 0919   MCHC 33.6 10/31/2021 1006   MCHC 34.6 01/04/2011 0919   MCHC 33.8 06/15/2009 1140   RDW 13.9 10/31/2021 1006   RDW 14.1 01/04/2011 0919   Iron Studies No results found for: IRON, TIBC, FERRITIN, IRONPCTSAT Lipid Panel     Component Value Date/Time   CHOL 150 02/06/2023 1051   TRIG 95 02/06/2023 1051   HDL 75 02/06/2023 1051   LDLCALC 58 02/06/2023 1051   Hepatic Function Panel     Component Value Date/Time   PROT 6.7 02/06/2023 1051   ALBUMIN 4.6 02/06/2023 1051   AST 28 02/06/2023 1051   ALT 31 02/06/2023 1051   ALKPHOS 67 02/06/2023 1051   BILITOT 0.4 02/06/2023 1051      Component Value Date/Time   TSH 1.200 10/31/2021 1006   Nutritional Lab Results  Component Value Date   VD25OH 54.6 02/06/2023   VD25OH 45.6 07/17/2022   VD25OH 34.8 10/31/2021    Attestations:   I, Bernita Bristle, acting as a Stage manager for Marceil Sensor, DO., have compiled all relevant documentation for today's office visit on behalf of Marceil Sensor, DO, while in the presence of [employer], DO.  Reviewed by clinician on day of visit: allergies, medications, problem list, medical history, surgical history, family history, social history, and previous encounter notes pertinent to patient's obesity diagnosis.  I have reviewed the above documentation for accuracy and completeness, and I agree with the above. Barbara Trevino, D.O.  The 21st Century Cures Act was signed into law in 2016 which includes the topic of electronic health records.  This provides immediate access to information in MyChart.  This includes consultation notes, operative notes, office notes, lab results and pathology reports.  If you have any questions about what you read please let us  know at your next visit so we can discuss your concerns and take corrective action if need  be.  We are right here with you.

## 2023-10-09 ENCOUNTER — Other Ambulatory Visit (HOSPITAL_COMMUNITY): Payer: Self-pay

## 2023-10-09 MED ORDER — CYCLOBENZAPRINE HCL 10 MG PO TABS
10.0000 mg | ORAL_TABLET | Freq: Three times a day (TID) | ORAL | 1 refills | Status: DC
  Filled 2023-10-09: qty 90, 30d supply, fill #0
  Filled 2023-11-06: qty 90, 30d supply, fill #1

## 2023-10-09 MED ORDER — PANTOPRAZOLE SODIUM 40 MG PO TBEC
40.0000 mg | DELAYED_RELEASE_TABLET | Freq: Every day | ORAL | 1 refills | Status: DC
  Filled 2023-10-09: qty 90, 90d supply, fill #0
  Filled 2024-01-13: qty 90, 90d supply, fill #1
  Filled 2024-04-05: qty 90, 90d supply, fill #2

## 2023-10-15 ENCOUNTER — Encounter (INDEPENDENT_AMBULATORY_CARE_PROVIDER_SITE_OTHER): Payer: Self-pay | Admitting: Family Medicine

## 2023-10-15 ENCOUNTER — Other Ambulatory Visit (HOSPITAL_COMMUNITY): Payer: Self-pay

## 2023-10-16 ENCOUNTER — Other Ambulatory Visit (HOSPITAL_COMMUNITY): Payer: Self-pay

## 2023-10-16 MED ORDER — MELOXICAM 15 MG PO TABS
15.0000 mg | ORAL_TABLET | Freq: Every day | ORAL | 3 refills | Status: AC
  Filled 2023-10-16: qty 90, 90d supply, fill #0
  Filled 2024-01-13: qty 90, 90d supply, fill #1
  Filled 2024-04-05: qty 90, 90d supply, fill #2
  Filled 2024-04-06: qty 90, 90d supply, fill #0
  Filled 2024-04-06: qty 90, 90d supply, fill #2

## 2023-10-16 MED ORDER — MELOXICAM 15 MG PO TABS
15.0000 mg | ORAL_TABLET | Freq: Every day | ORAL | 1 refills | Status: DC
  Filled 2023-10-16: qty 90, 90d supply, fill #0

## 2023-10-22 ENCOUNTER — Other Ambulatory Visit: Payer: Self-pay

## 2023-10-22 ENCOUNTER — Other Ambulatory Visit (HOSPITAL_COMMUNITY): Payer: Self-pay

## 2023-11-06 ENCOUNTER — Ambulatory Visit (INDEPENDENT_AMBULATORY_CARE_PROVIDER_SITE_OTHER): Admitting: Family Medicine

## 2023-11-06 ENCOUNTER — Encounter (INDEPENDENT_AMBULATORY_CARE_PROVIDER_SITE_OTHER): Payer: Self-pay | Admitting: Family Medicine

## 2023-11-06 VITALS — BP 124/79 | HR 83 | Temp 97.6°F | Ht 68.0 in | Wt 246.0 lb

## 2023-11-06 DIAGNOSIS — Z7985 Long-term (current) use of injectable non-insulin antidiabetic drugs: Secondary | ICD-10-CM

## 2023-11-06 DIAGNOSIS — E1165 Type 2 diabetes mellitus with hyperglycemia: Secondary | ICD-10-CM

## 2023-11-06 DIAGNOSIS — E669 Obesity, unspecified: Secondary | ICD-10-CM | POA: Diagnosis not present

## 2023-11-06 DIAGNOSIS — L659 Nonscarring hair loss, unspecified: Secondary | ICD-10-CM | POA: Diagnosis not present

## 2023-11-06 DIAGNOSIS — Z6837 Body mass index (BMI) 37.0-37.9, adult: Secondary | ICD-10-CM

## 2023-11-06 NOTE — Progress Notes (Signed)
 SUBJECTIVE:  Chief Complaint: Obesity  Interim History: Patient had a a great vacation at [facility] with her family and had quite a few celebrations in that time as well.  She mentions it is the longest stretch since she came in. Over the next 6 weeks she is planning to go to [place]  in September to watch her granddaughter.  Wants to get back on track food wise.  Barbara Trevino is here to discuss her progress with her obesity treatment plan. She is on the [employer] and states she is following her eating plan approximately 50 % of the time. She states she is exercising 15-20 minutes 6 times per week.   OBJECTIVE: Visit Diagnoses: Problem List Items Addressed This Visit       Endocrine   Type 2 diabetes mellitus with hyperglycemia, without long-term current use of insulin  (HCC)     Other   Obesity with starting BMI of 45.0   Other Visit Diagnoses       Hair loss    -  Primary     BMI 37.0-37.9, adult -- Current BMI 37.41           Vitals Temp: 97.6 F (36.4 C) BP: 124/79 Pulse Rate: 83 SpO2: 99 %   Anthropometric Measurements Height: 5' 8 (1.727 m) Weight: 246 lb (111.6 kg) BMI (Calculated): 37.41 Weight at Last Visit: 246 lb Weight Lost Since Last Visit: 0 Weight Gained Since Last Visit: 0 Starting Weight: 288 lb Total Weight Loss (lbs): 42 lb (19.1 kg)   Body Composition  Body Fat %: 50.3 % Fat Mass (lbs): 124.2 lbs Muscle Mass (lbs): 116.4 lbs Total Body Water (lbs): 97.8 lbs Visceral Fat Rating : 15   Other Clinical Data Today's Visit #: 25 Starting Date: 06/18/22 Comments: Pesc     ASSESSMENT AND PLAN: Assessment & Plan Hair loss Patient reports significant hair loss which she is unclear if associated with menopause or with GLP-1 usage.  Discussed usage of topical minoxidil or oral minoxidil as first-line treatment.  We also discussed further interventions which could include steroid injections or light therapy. Type 2 diabetes  mellitus with hyperglycemia, without long-term current use of insulin  [facility]) Patient is doing well on Mounjaro .  No refill of medication needed today.  Continue current dose.  Reevaluate response to medication and dosage at next appointment. Obesity with starting BMI of 45.0  BMI 37.0-37.9, adult -- Current BMI 37.41    Diet: Darion is currently in the action stage of change. As such, her goal is to continue with weight loss efforts and has agreed to the [employer].   Exercise:  For substantial health benefits, adults should do at least 150 minutes (2 hours and 30 minutes) a week of moderate-intensity, or 75 minutes (1 hour and 15 minutes) a week of vigorous-intensity aerobic physical activity, or an equivalent combination of moderate- and vigorous-intensity aerobic activity. Aerobic activity should be performed in episodes of at least 10 minutes, and preferably, it should be spread throughout the week.  Behavior Modification:  We discussed the following Behavioral Modification Strategies today: increasing lean protein intake, decreasing simple carbohydrates, increasing vegetables, meal planning and cooking strategies, avoiding temptations, and planning for success.   Return in about 5 weeks (around 12/11/2023).   She was informed of the importance of frequent follow up visits to maximize her success with intensive lifestyle modifications for her multiple health conditions.  Attestation Statements:   Reviewed by clinician on day of visit: allergies, medications,  problem list, medical history, surgical history, family history, social history, and previous encounter notes.     Adelita Cho, MD

## 2023-11-18 NOTE — Assessment & Plan Note (Signed)
 Patient is doing well on Mounjaro .  No refill of medication needed today.  Continue current dose.  Reevaluate response to medication and dosage at next appointment.

## 2023-11-25 ENCOUNTER — Other Ambulatory Visit (HOSPITAL_COMMUNITY): Payer: Self-pay

## 2023-11-26 ENCOUNTER — Other Ambulatory Visit (HOSPITAL_COMMUNITY): Payer: Self-pay

## 2023-11-26 MED ORDER — VENLAFAXINE HCL ER 150 MG PO CP24
150.0000 mg | ORAL_CAPSULE | Freq: Every day | ORAL | 0 refills | Status: DC
  Filled 2023-11-26: qty 90, 90d supply, fill #0

## 2023-12-15 ENCOUNTER — Other Ambulatory Visit (HOSPITAL_COMMUNITY): Payer: Self-pay

## 2023-12-16 ENCOUNTER — Ambulatory Visit (INDEPENDENT_AMBULATORY_CARE_PROVIDER_SITE_OTHER): Admitting: Family Medicine

## 2023-12-16 ENCOUNTER — Other Ambulatory Visit (HOSPITAL_COMMUNITY): Payer: Self-pay

## 2023-12-16 VITALS — BP 93/61 | HR 82 | Temp 98.2°F | Ht 68.0 in | Wt 243.0 lb

## 2023-12-16 DIAGNOSIS — E669 Obesity, unspecified: Secondary | ICD-10-CM | POA: Diagnosis not present

## 2023-12-16 DIAGNOSIS — L659 Nonscarring hair loss, unspecified: Secondary | ICD-10-CM

## 2023-12-16 DIAGNOSIS — Z6836 Body mass index (BMI) 36.0-36.9, adult: Secondary | ICD-10-CM | POA: Diagnosis not present

## 2023-12-16 DIAGNOSIS — E1165 Type 2 diabetes mellitus with hyperglycemia: Secondary | ICD-10-CM | POA: Diagnosis not present

## 2023-12-16 DIAGNOSIS — Z7985 Long-term (current) use of injectable non-insulin antidiabetic drugs: Secondary | ICD-10-CM | POA: Diagnosis not present

## 2023-12-16 MED ORDER — AMITRIPTYLINE HCL 25 MG PO TABS
25.0000 mg | ORAL_TABLET | Freq: Every evening | ORAL | 1 refills | Status: DC | PRN
  Filled 2023-12-16: qty 90, 45d supply, fill #0
  Filled 2023-12-16: qty 180, 90d supply, fill #0
  Filled 2024-02-03: qty 90, 45d supply, fill #1
  Filled 2024-04-05: qty 90, 45d supply, fill #2
  Filled 2024-04-06: qty 90, 45d supply, fill #0

## 2023-12-16 MED ORDER — TIRZEPATIDE 10 MG/0.5ML ~~LOC~~ SOAJ
10.0000 mg | SUBCUTANEOUS | 0 refills | Status: DC
  Filled 2023-12-16: qty 6, 84d supply, fill #0

## 2023-12-16 NOTE — Assessment & Plan Note (Signed)
 Stable on Mounjaro  10 mg weekly.  Reports she is able to get total allotted calories and protein in but occasionally eat soft plan with indulgent food.  No GI side effects reported.  Will continue current dose for the next 3 months.  Refill sent to pharmacy

## 2023-12-16 NOTE — Progress Notes (Signed)
 SUBJECTIVE:  Chief Complaint: Obesity  Interim History: Patient getting to go to [place]  for a week to watch her granddaughter.  She is excited to spend time with her granddaughter.  She had a funeral last week for her former brother in [employer].  She has been working hard and feels like she has become complacent.  She wasn't as on plan as she realizes she could have been.  She back on [employer].  She realizes she could have been more compliant with her protein intake.   Barbara Trevino is here to discuss her progress with her obesity treatment plan. She is on the [employer] and states she is following her eating plan approximately 50-60 % of the time. She states she is walking 15 minutes 5 times per week.   OBJECTIVE: Visit Diagnoses: Problem List Items Addressed This Visit       Endocrine   Type 2 diabetes mellitus with hyperglycemia, without long-term current use of insulin  (HCC) - Primary   Relevant Medications   tirzepatide  (MOUNJARO ) 10 MG/0.5ML Pen     Other   Obesity with starting BMI of 45.0   Relevant Medications   tirzepatide  (MOUNJARO ) 10 MG/0.5ML Pen   Other Visit Diagnoses       Hair loss         BMI 36.0-36.9,adult           Vitals Temp: 98.2 F (36.8 C) BP: 93/61 Pulse Rate: 82 SpO2: 98 %   Anthropometric Measurements Height: 5' 8 (1.727 m) Weight: 243 lb (110.2 kg) BMI (Calculated): 36.96 Weight at Last Visit: 246 lb Weight Lost Since Last Visit: 3 Weight Gained Since Last Visit: 0 Starting Weight: 288 lb Total Weight Loss (lbs): 45 lb (20.4 kg)   Body Composition  Body Fat %: 49.4 % Fat Mass (lbs): 120.4 lbs Muscle Mass (lbs): 117.2 lbs Total Body Water (lbs): 92.6 lbs Visceral Fat Rating : 15   Other Clinical Data Today's Visit #: 26 Starting Date: 06/18/22 Comments: Pesc     ASSESSMENT AND PLAN: Assessment & Plan Hair loss Patient has started B12 but not yet started minoxidil.  We discussed possibility of using supplement  Nutrafol which patient reports is very expensive and we discussed possibility of liver toxicity with that supplement.  Patient would like to start topical Rogaine and follow-up at next appointment as to whether or not she is seeing any hair retention at that time. Type 2 diabetes mellitus with hyperglycemia, without long-term current use of insulin  (HCC) Stable on Mounjaro  10 mg weekly.  Reports she is able to get total allotted calories and protein in but occasionally eat soft plan with indulgent food.  No GI side effects reported.  Will continue current dose for the next 3 months.  Refill sent to pharmacy Obesity with starting BMI of 45.0  BMI 36.0-36.9,adult    Diet: Amarilis is currently in the action stage of change. As such, her goal is to continue with weight loss efforts and has agreed to the [employer].   Exercise:  For substantial health benefits, adults should do at least 150 minutes (2 hours and 30 minutes) a week of moderate-intensity, or 75 minutes (1 hour and 15 minutes) a week of vigorous-intensity aerobic physical activity, or an equivalent combination of moderate- and vigorous-intensity aerobic activity. Aerobic activity should be performed in episodes of at least 10 minutes, and preferably, it should be spread throughout the week.  Behavior Modification:  We discussed the following Behavioral Modification Strategies today:  increasing lean protein intake, decreasing simple carbohydrates, increasing vegetables, meal planning and cooking strategies, keeping healthy foods in the home, and planning for success.   Return in about 8 weeks (around 02/10/2024).   She was informed of the importance of frequent follow up visits to maximize her success with intensive lifestyle modifications for her multiple health conditions.  Attestation Statements:   Reviewed by clinician on day of visit: allergies, medications, problem list, medical history, surgical history, family history,  social history, and previous encounter notes.     Adelita Cho, MD

## 2023-12-17 ENCOUNTER — Other Ambulatory Visit (HOSPITAL_COMMUNITY): Payer: Self-pay

## 2023-12-29 ENCOUNTER — Other Ambulatory Visit (HOSPITAL_COMMUNITY): Payer: Self-pay

## 2023-12-30 ENCOUNTER — Other Ambulatory Visit (HOSPITAL_COMMUNITY): Payer: Self-pay

## 2023-12-30 MED ORDER — VALACYCLOVIR HCL 1 G PO TABS
2.0000 g | ORAL_TABLET | Freq: Two times a day (BID) | ORAL | 1 refills | Status: AC
  Filled 2023-12-30 – 2024-04-06 (×2): qty 24, 6d supply, fill #0
  Filled 2024-04-06: qty 24, 6d supply, fill #1

## 2024-01-02 ENCOUNTER — Other Ambulatory Visit (HOSPITAL_COMMUNITY): Payer: Self-pay

## 2024-01-02 ENCOUNTER — Telehealth: Admitting: Physician Assistant

## 2024-01-02 DIAGNOSIS — J069 Acute upper respiratory infection, unspecified: Secondary | ICD-10-CM | POA: Diagnosis not present

## 2024-01-02 MED ORDER — PROMETHAZINE-DM 6.25-15 MG/5ML PO SYRP
5.0000 mL | ORAL_SOLUTION | Freq: Four times a day (QID) | ORAL | 0 refills | Status: AC | PRN
  Filled 2024-01-02: qty 118, 6d supply, fill #0

## 2024-01-02 NOTE — Progress Notes (Signed)
 E-Visit for Cough   We are sorry that you are not feeling well.  Here is how we plan to help!  Based on your presentation I believe you most likely have A cough due to a virus.  This is called viral bronchitis and is best treated by rest, plenty of fluids and control of the cough.  You may use Ibuprofen  or Tylenol  as directed to help your symptoms.     In addition you may use a prescription cough syrup called Promethazine  DM Take 5mL every 6 hours as needed for cough, but can be saved and used at bedtime only due to drowsiness side effect.  From your responses in the eVisit questionnaire you describe inflammation in the upper respiratory tract which is causing a significant cough.  This is commonly called Bronchitis and has four common causes:   Allergies Viral Infections Acid Reflux Bacterial Infection Allergies, viruses and acid reflux are treated by controlling symptoms or eliminating the cause. An example might be a cough caused by taking certain blood pressure medications. You stop the cough by changing the medication. Another example might be a cough caused by acid reflux. Controlling the reflux helps control the cough.  USE OF BRONCHODILATOR (RESCUE) INHALERS: There is a risk from using your bronchodilator too frequently.  The risk is that over-reliance on a medication which only relaxes the muscles surrounding the breathing tubes can reduce the effectiveness of medications prescribed to reduce swelling and congestion of the tubes themselves.  Although you feel brief relief from the bronchodilator inhaler, your asthma may actually be worsening with the tubes becoming more swollen and filled with mucus.  This can delay other crucial treatments, such as oral steroid medications. If you need to use a bronchodilator inhaler daily, several times per day, you should discuss this with your provider.  There are probably better treatments that could be used to keep your asthma under control.      HOME CARE Only take medications as instructed by your medical team. Complete the entire course of an antibiotic. Drink plenty of fluids and get plenty of rest. Avoid close contacts especially the very young and the elderly Cover your mouth if you cough or cough into your sleeve. Always remember to wash your hands A steam or ultrasonic humidifier can help congestion.   GET HELP RIGHT AWAY IF: You develop worsening fever. You become short of breath You cough up blood. Your symptoms persist after you have completed your treatment plan MAKE SURE YOU  Understand these instructions. Will watch your condition. Will get help right away if you are not doing well or get worse.    Thank you for choosing an e-visit.  Your e-visit answers were reviewed by a board certified advanced clinical practitioner to complete your personal care plan. Depending upon the condition, your plan could have included both over the counter or prescription medications.  Please review your pharmacy choice. Make sure the pharmacy is open so you can pick up prescription now. If there is a problem, you may contact your provider through [employer] and have the prescription routed to another pharmacy.  Your safety is important to us . If you have drug allergies check your prescription carefully.   For the next 24 hours you can use MyChart to ask questions about today's visit, request a non-urgent call back, or ask for a work or school excuse. You will get an email in the next two days asking about your experience. I hope that your e-visit  has been valuable and will speed your recovery.   I have spent 5 minutes in review of e-visit questionnaire, review and updating patient chart, medical decision making and response to patient.   Delon CHRISTELLA Dickinson, PA-C

## 2024-01-13 ENCOUNTER — Other Ambulatory Visit (HOSPITAL_COMMUNITY): Payer: Self-pay

## 2024-01-14 ENCOUNTER — Other Ambulatory Visit: Payer: Self-pay

## 2024-01-14 ENCOUNTER — Other Ambulatory Visit (HOSPITAL_COMMUNITY): Payer: Self-pay

## 2024-01-14 MED ORDER — ROSUVASTATIN CALCIUM 20 MG PO TABS
20.0000 mg | ORAL_TABLET | Freq: Every day | ORAL | 1 refills | Status: AC
  Filled 2024-01-14: qty 90, 90d supply, fill #0
  Filled 2024-04-05: qty 90, 90d supply, fill #1
  Filled 2024-04-06: qty 90, 90d supply, fill #0
  Filled 2024-04-06: qty 90, 90d supply, fill #1

## 2024-01-14 MED ORDER — CYCLOBENZAPRINE HCL 10 MG PO TABS
10.0000 mg | ORAL_TABLET | Freq: Three times a day (TID) | ORAL | 1 refills | Status: DC
  Filled 2024-01-14: qty 90, 30d supply, fill #0
  Filled 2024-02-19: qty 90, 30d supply, fill #1

## 2024-01-17 DIAGNOSIS — M1712 Unilateral primary osteoarthritis, left knee: Secondary | ICD-10-CM | POA: Diagnosis not present

## 2024-01-23 ENCOUNTER — Other Ambulatory Visit (HOSPITAL_COMMUNITY): Payer: Self-pay

## 2024-01-23 MED ORDER — METHYLPREDNISOLONE 4 MG PO TBPK
ORAL_TABLET | ORAL | 0 refills | Status: AC
  Filled 2024-01-23 (×2): qty 21, 6d supply, fill #0

## 2024-02-10 ENCOUNTER — Ambulatory Visit (INDEPENDENT_AMBULATORY_CARE_PROVIDER_SITE_OTHER): Payer: Self-pay | Admitting: Family Medicine

## 2024-02-10 ENCOUNTER — Other Ambulatory Visit (HOSPITAL_COMMUNITY): Payer: Self-pay

## 2024-02-10 ENCOUNTER — Encounter (INDEPENDENT_AMBULATORY_CARE_PROVIDER_SITE_OTHER): Payer: Self-pay | Admitting: Family Medicine

## 2024-02-10 VITALS — BP 133/83 | HR 69 | Temp 97.7°F | Ht 68.0 in | Wt 248.0 lb

## 2024-02-10 DIAGNOSIS — E669 Obesity, unspecified: Secondary | ICD-10-CM | POA: Diagnosis not present

## 2024-02-10 DIAGNOSIS — E785 Hyperlipidemia, unspecified: Secondary | ICD-10-CM | POA: Diagnosis not present

## 2024-02-10 DIAGNOSIS — Z6837 Body mass index (BMI) 37.0-37.9, adult: Secondary | ICD-10-CM

## 2024-02-10 DIAGNOSIS — E1165 Type 2 diabetes mellitus with hyperglycemia: Secondary | ICD-10-CM | POA: Diagnosis not present

## 2024-02-10 DIAGNOSIS — Z7985 Long-term (current) use of injectable non-insulin antidiabetic drugs: Secondary | ICD-10-CM | POA: Diagnosis not present

## 2024-02-10 DIAGNOSIS — E1169 Type 2 diabetes mellitus with other specified complication: Secondary | ICD-10-CM | POA: Diagnosis not present

## 2024-02-10 MED ORDER — TIRZEPATIDE 10 MG/0.5ML ~~LOC~~ SOAJ
10.0000 mg | SUBCUTANEOUS | 0 refills | Status: AC
  Filled 2024-02-10 – 2024-03-02 (×2): qty 6, 84d supply, fill #0

## 2024-02-10 NOTE — Progress Notes (Signed)
 SUBJECTIVE:  Chief Complaint: Obesity  Interim History: Patient's brother in law and aunt passed away since last appointment.  She was also told she needs a total knee replacement.  She has recently got a knee injection and has not felt the best since then.  Work is about the same- higher productivity expectations.  Hasn't been the most consistent on the pescatarian.  She feels like she needs a restart but realizes it getting into the hardest time of the year.  She will try to mindful over the holidays.  She feels like she is in a rut with food choices and quantity. She is habitually snacking after dinner.   Barbara Trevino is here to discuss her progress with her obesity treatment plan. She is on the [employer] and states she is following her eating plan approximately 60 % of the time. She states she is walking some.   OBJECTIVE: Visit Diagnoses: Problem List Items Addressed This Visit       Endocrine   Type 2 diabetes mellitus with hyperglycemia, without long-term current use of insulin  (HCC) - Primary   Relevant Medications   tirzepatide  (MOUNJARO ) 10 MG/0.5ML Pen   Hyperlipidemia associated with type 2 diabetes mellitus (HCC)   Relevant Medications   tirzepatide  (MOUNJARO ) 10 MG/0.5ML Pen     Other   Obesity with starting BMI of 45.0   Relevant Medications   tirzepatide  (MOUNJARO ) 10 MG/0.5ML Pen   Other Visit Diagnoses       BMI 37.0-37.9, adult -- Current BMI 37.41           Vitals Temp: 97.7 F (36.5 C) BP: 133/83 Pulse Rate: 69 SpO2: 92 %   Anthropometric Measurements Height: 5' 8 (1.727 m) Weight: 248 lb (112.5 kg) BMI (Calculated): 37.72 Weight at Last Visit: 243 lb Weight Lost Since Last Visit: 0 Weight Gained Since Last Visit: 5 Starting Weight: 288 lb Total Weight Loss (lbs): 40 lb (18.1 kg)   Body Composition  Body Fat %: 51.1 % Fat Mass (lbs): 126.8 lbs Muscle Mass (lbs): 115 lbs Visceral Fat Rating : 16   Other Clinical Data Today's Visit  #: 27 Starting Date: 06/18/22 Comments: [place]     ASSESSMENT AND PLAN: Assessment & Plan Type 2 diabetes mellitus with hyperglycemia, without long-term current use of insulin  (HCC) Doing very well on current dose of Mounjaro .  No change in dose but refill needed today.  We discussed continuing current dose and then re-evaluating after the holidays if patient needs a higher dose to continue to have intake control she did previously. Hyperlipidemia associated with type 2 diabetes mellitus (HCC) On statin and GLP therapy.  Will continue both and will need labs with PCP in the next 2-3 months. Obesity with starting BMI of 45.0  BMI 37.0-37.9, adult -- Current BMI 37.41    Diet: [place] is currently in the action stage of change. As such, her goal is to continue with weight loss efforts and has agreed to practicing portion control and making smarter food choices, such as increasing vegetables and decreasing simple carbohydrates.   Exercise:  For substantial health benefits, adults should do at least 150 minutes (2 hours and 30 minutes) a week of moderate-intensity, or 75 minutes (1 hour and 15 minutes) a week of vigorous-intensity aerobic physical activity, or an equivalent combination of moderate- and vigorous-intensity aerobic activity. Aerobic activity should be performed in episodes of at least 10 minutes, and preferably, it should be spread throughout the week.  Behavior Modification:  We discussed the following Behavioral Modification Strategies today: increasing lean protein intake, decreasing simple carbohydrates, increasing vegetables, meal planning and cooking strategies, and holiday eating strategies.   Return in about 8 weeks (around 04/06/2024).   She was informed of the importance of frequent follow up visits to maximize her success with intensive lifestyle modifications for her multiple health conditions.  Attestation Statements:   Reviewed by clinician on day of visit:  allergies, medications, problem list, medical history, surgical history, family history, social history, and previous encounter notes.     Adelita Cho, MD

## 2024-02-16 NOTE — Assessment & Plan Note (Signed)
 On statin and GLP therapy.  Will continue both and will need labs with PCP in the next 2-3 months.

## 2024-02-16 NOTE — Assessment & Plan Note (Signed)
 Doing very well on current dose of Mounjaro .  No change in dose but refill needed today.  We discussed continuing current dose and then re-evaluating after the holidays if patient needs a higher dose to continue to have intake control she did previously.

## 2024-02-20 ENCOUNTER — Other Ambulatory Visit (HOSPITAL_COMMUNITY): Payer: Self-pay

## 2024-02-24 ENCOUNTER — Other Ambulatory Visit (HOSPITAL_COMMUNITY): Payer: Self-pay

## 2024-02-24 MED ORDER — TRAMADOL HCL 50 MG PO TABS
50.0000 mg | ORAL_TABLET | Freq: Four times a day (QID) | ORAL | 0 refills | Status: DC | PRN
  Filled 2024-02-24: qty 20, 5d supply, fill #0

## 2024-02-25 ENCOUNTER — Other Ambulatory Visit (HOSPITAL_COMMUNITY): Payer: Self-pay

## 2024-02-25 DIAGNOSIS — Z853 Personal history of malignant neoplasm of breast: Secondary | ICD-10-CM | POA: Diagnosis not present

## 2024-02-25 DIAGNOSIS — G620 Drug-induced polyneuropathy: Secondary | ICD-10-CM | POA: Diagnosis not present

## 2024-02-25 DIAGNOSIS — E559 Vitamin D deficiency, unspecified: Secondary | ICD-10-CM | POA: Diagnosis not present

## 2024-02-25 DIAGNOSIS — K760 Fatty (change of) liver, not elsewhere classified: Secondary | ICD-10-CM | POA: Diagnosis not present

## 2024-02-25 DIAGNOSIS — K219 Gastro-esophageal reflux disease without esophagitis: Secondary | ICD-10-CM | POA: Diagnosis not present

## 2024-02-25 DIAGNOSIS — M1990 Unspecified osteoarthritis, unspecified site: Secondary | ICD-10-CM | POA: Diagnosis not present

## 2024-02-25 DIAGNOSIS — M1712 Unilateral primary osteoarthritis, left knee: Secondary | ICD-10-CM | POA: Diagnosis not present

## 2024-02-25 DIAGNOSIS — E1169 Type 2 diabetes mellitus with other specified complication: Secondary | ICD-10-CM | POA: Diagnosis not present

## 2024-02-25 DIAGNOSIS — E782 Mixed hyperlipidemia: Secondary | ICD-10-CM | POA: Diagnosis not present

## 2024-02-25 DIAGNOSIS — Z8551 Personal history of malignant neoplasm of bladder: Secondary | ICD-10-CM | POA: Diagnosis not present

## 2024-02-25 MED ORDER — VENLAFAXINE HCL ER 150 MG PO CP24
150.0000 mg | ORAL_CAPSULE | Freq: Every day | ORAL | 3 refills | Status: AC
  Filled 2024-02-25: qty 90, 90d supply, fill #0
  Filled 2024-04-06: qty 90, 90d supply, fill #1

## 2024-02-25 MED ORDER — AMITRIPTYLINE HCL 25 MG PO TABS
25.0000 mg | ORAL_TABLET | Freq: Every day | ORAL | 3 refills | Status: AC
  Filled 2024-02-25 – 2024-04-06 (×3): qty 180, 90d supply, fill #0

## 2024-03-02 ENCOUNTER — Other Ambulatory Visit (HOSPITAL_COMMUNITY): Payer: Self-pay

## 2024-03-20 ENCOUNTER — Other Ambulatory Visit (HOSPITAL_COMMUNITY): Payer: Self-pay

## 2024-03-20 MED ORDER — TRAMADOL HCL 50 MG PO TABS
50.0000 mg | ORAL_TABLET | Freq: Three times a day (TID) | ORAL | 0 refills | Status: DC | PRN
  Filled 2024-03-20: qty 30, 10d supply, fill #0

## 2024-04-05 ENCOUNTER — Other Ambulatory Visit (HOSPITAL_COMMUNITY): Payer: Self-pay

## 2024-04-06 ENCOUNTER — Other Ambulatory Visit (HOSPITAL_COMMUNITY): Payer: Self-pay

## 2024-04-06 ENCOUNTER — Other Ambulatory Visit: Payer: Self-pay

## 2024-04-06 MED ORDER — PANTOPRAZOLE SODIUM 40 MG PO TBEC
40.0000 mg | DELAYED_RELEASE_TABLET | Freq: Every day | ORAL | 1 refills | Status: AC
  Filled 2024-04-06 (×3): qty 90, 90d supply, fill #0

## 2024-04-07 ENCOUNTER — Other Ambulatory Visit (HOSPITAL_COMMUNITY): Payer: Self-pay

## 2024-04-07 ENCOUNTER — Other Ambulatory Visit: Payer: Self-pay

## 2024-04-08 ENCOUNTER — Encounter (HOSPITAL_COMMUNITY): Payer: Self-pay

## 2024-04-08 ENCOUNTER — Other Ambulatory Visit (HOSPITAL_COMMUNITY): Payer: Self-pay

## 2024-04-13 ENCOUNTER — Ambulatory Visit (INDEPENDENT_AMBULATORY_CARE_PROVIDER_SITE_OTHER): Admitting: Family Medicine

## 2024-04-13 ENCOUNTER — Encounter (INDEPENDENT_AMBULATORY_CARE_PROVIDER_SITE_OTHER): Payer: Self-pay | Admitting: Family Medicine

## 2024-04-13 VITALS — BP 134/83 | HR 85 | Temp 98.2°F | Ht 68.0 in | Wt 250.0 lb

## 2024-04-13 DIAGNOSIS — Z6838 Body mass index (BMI) 38.0-38.9, adult: Secondary | ICD-10-CM | POA: Diagnosis not present

## 2024-04-13 DIAGNOSIS — E669 Obesity, unspecified: Secondary | ICD-10-CM

## 2024-04-13 DIAGNOSIS — M1712 Unilateral primary osteoarthritis, left knee: Secondary | ICD-10-CM

## 2024-04-13 DIAGNOSIS — E1165 Type 2 diabetes mellitus with hyperglycemia: Secondary | ICD-10-CM

## 2024-04-13 DIAGNOSIS — Z7985 Long-term (current) use of injectable non-insulin antidiabetic drugs: Secondary | ICD-10-CM

## 2024-04-13 NOTE — Progress Notes (Signed)
" ° °  SUBJECTIVE:  Chief Complaint: Obesity  Interim History: Patient mentions her holidays were ok- her kids were all home and she was sad when they all left.  She is in need of a total knee replacement.  Her mother got diagnosed with breast cancer for the second time and has her surgery scheduled for January 20th.  She is limited in her physical activity due to pain in her knee.   Barbara Trevino is here to discuss her progress with her obesity treatment plan. She is on the [employer] and states she is following her eating plan approximately 50 % of the time. She states she is exercising by walking 20 minutes 4 times per week.   OBJECTIVE: Visit Diagnoses: Problem List Items Addressed This Visit   None   Vitals Temp: 98.2 F (36.8 C) BP: 134/83 Pulse Rate: 85 SpO2: 90 %   Anthropometric Measurements Height: 5' 8 (1.727 m) Weight: 250 lb (113.4 kg) BMI (Calculated): 38.02 Weight at Last Visit: 248lb Weight Lost Since Last Visit: 0lb Weight Gained Since Last Visit: 2lb Starting Weight: 288lb Total Weight Loss (lbs): 38 lb (17.2 kg)   Body Composition  Body Fat %: 51.6 % Fat Mass (lbs): 129.2 lbs Muscle Mass (lbs): 114.8 lbs Visceral Fat Rating : 16   Other Clinical Data Fasting: no Labs: no Today's Visit #: 28 Starting Date: 06/18/22     ASSESSMENT AND PLAN: Assessment & Plan Primary osteoarthritis of left knee Patient is contemplating further intervention for her osteoarthritis.  Recent injections did not alleviate significant pain.  She is contemplating knee replacement at this time.  Will follow-up on patient's decision for further intervention at next appointment. Type 2 diabetes mellitus with hyperglycemia, without long-term current use of insulin  (HCC) Tolerating current strength and dose of Mounjaro .  No refills needed and no change in medication at this time. BMI 38.0-38.9,adult  Obesity with starting BMI of 45.0    Diet: [place] is currently in the  action stage of change. As such, her goal is to continue with weight loss efforts and has agreed to the Pescatarian Plan with modifications + 300 or Category 4 plan.   Exercise:  All adults should avoid inactivity. Some activity is better than none, and adults who participate in any amount of physical activity, gain some health benefits.  Behavior Modification:  We discussed the following Behavioral Modification Strategies today: increasing lean protein intake, decreasing simple carbohydrates, increasing vegetables, meal planning and cooking strategies, keeping healthy foods in the home, and emotional eating strategies .   Follow-up in 8 weeks  She was informed of the importance of frequent follow up visits to maximize her success with intensive lifestyle modifications for her multiple health conditions.  Attestation Statements:   Reviewed by clinician on day of visit: allergies, medications, problem list, medical history, surgical history, family history, social history, and previous encounter notes.   Adelita Cho, MD  "

## 2024-04-22 ENCOUNTER — Other Ambulatory Visit (HOSPITAL_BASED_OUTPATIENT_CLINIC_OR_DEPARTMENT_OTHER): Payer: Self-pay

## 2024-04-22 ENCOUNTER — Other Ambulatory Visit (HOSPITAL_COMMUNITY): Payer: Self-pay

## 2024-04-22 MED ORDER — GABAPENTIN 300 MG PO CAPS
600.0000 mg | ORAL_CAPSULE | Freq: Every evening | ORAL | 0 refills | Status: AC
  Filled 2024-04-22 (×2): qty 180, 90d supply, fill #0

## 2024-04-22 NOTE — Assessment & Plan Note (Signed)
 Tolerating current strength and dose of Mounjaro .  No refills needed and no change in medication at this time.

## 2024-04-23 ENCOUNTER — Other Ambulatory Visit: Payer: Self-pay

## 2024-05-01 ENCOUNTER — Other Ambulatory Visit (HOSPITAL_COMMUNITY): Payer: Self-pay

## 2024-05-01 MED ORDER — TRAMADOL HCL 50 MG PO TABS: 50.0000 mg | ORAL_TABLET | Freq: Three times a day (TID) | ORAL | 0 refills | Status: AC | PRN

## 2024-05-01 MED ORDER — TRAMADOL HCL 50 MG PO TABS
50.0000 mg | ORAL_TABLET | Freq: Three times a day (TID) | ORAL | 0 refills | Status: AC | PRN
  Filled 2024-05-01: qty 30, 10d supply, fill #0

## 2024-05-04 ENCOUNTER — Other Ambulatory Visit (HOSPITAL_COMMUNITY): Payer: Self-pay

## 2024-05-05 ENCOUNTER — Other Ambulatory Visit (HOSPITAL_COMMUNITY): Payer: Self-pay

## 2024-05-05 ENCOUNTER — Other Ambulatory Visit: Payer: Self-pay

## 2024-05-05 MED ORDER — CYCLOBENZAPRINE HCL 10 MG PO TABS
10.0000 mg | ORAL_TABLET | Freq: Three times a day (TID) | ORAL | 0 refills | Status: AC
  Filled 2024-05-05: qty 90, 30d supply, fill #0

## 2024-05-20 ENCOUNTER — Ambulatory Visit (INDEPENDENT_AMBULATORY_CARE_PROVIDER_SITE_OTHER): Admitting: Family Medicine
